# Patient Record
Sex: Male | Born: 1980
Health system: Southern US, Community
[De-identification: ages and names within clinical notes are randomized; demographics above are authoritative.]

## PROBLEM LIST (undated history)

## (undated) DIAGNOSIS — K7460 Unspecified cirrhosis of liver: Secondary | ICD-10-CM

## (undated) DIAGNOSIS — E785 Hyperlipidemia, unspecified: Secondary | ICD-10-CM

## (undated) DIAGNOSIS — Z8719 Personal history of other diseases of the digestive system: Secondary | ICD-10-CM

## (undated) DIAGNOSIS — E119 Type 2 diabetes mellitus without complications: Secondary | ICD-10-CM

## (undated) DIAGNOSIS — K219 Gastro-esophageal reflux disease without esophagitis: Secondary | ICD-10-CM

## (undated) HISTORY — DX: Type 2 diabetes mellitus without complications: E11.9

## (undated) HISTORY — DX: Hyperlipidemia, unspecified: E78.5

## (undated) HISTORY — DX: Unspecified cirrhosis of liver: K74.60

---

## 2013-11-20 ENCOUNTER — Telehealth: Payer: Self-pay

## 2013-11-20 ENCOUNTER — Ambulatory Visit (INDEPENDENT_AMBULATORY_CARE_PROVIDER_SITE_OTHER): Payer: 59 | Admitting: Emergency Medicine

## 2013-11-20 VITALS — BP 120/100 | HR 84 | Temp 98.0°F | Resp 16 | Ht 71.0 in | Wt 292.0 lb

## 2013-11-20 DIAGNOSIS — E119 Type 2 diabetes mellitus without complications: Secondary | ICD-10-CM

## 2013-11-20 DIAGNOSIS — R55 Syncope and collapse: Secondary | ICD-10-CM

## 2013-11-20 LAB — POCT CBC
GRANULOCYTE PERCENT: 59.8 % (ref 37–80)
HEMATOCRIT: 51.8 % (ref 43.5–53.7)
Hemoglobin: 16.2 g/dL (ref 14.1–18.1)
LYMPH, POC: 2.1 (ref 0.6–3.4)
MCH, POC: 28.8 pg (ref 27–31.2)
MCHC: 31.3 g/dL — AB (ref 31.8–35.4)
MCV: 92 fL (ref 80–97)
MID (cbc): 0.2 (ref 0–0.9)
MPV: 8.6 fL (ref 0–99.8)
POC GRANULOCYTE: 3.5 (ref 2–6.9)
POC LYMPH PERCENT: 36.3 %L (ref 10–50)
POC MID %: 3.9 % (ref 0–12)
Platelet Count, POC: 205 10*3/uL (ref 142–424)
RBC: 5.63 M/uL (ref 4.69–6.13)
RDW, POC: 13.7 %
WBC: 5.8 10*3/uL (ref 4.6–10.2)

## 2013-11-20 LAB — POCT URINALYSIS DIPSTICK
BILIRUBIN UA: NEGATIVE
Glucose, UA: 500
KETONES UA: NEGATIVE
Leukocytes, UA: NEGATIVE
Nitrite, UA: NEGATIVE
PH UA: 6
RBC UA: NEGATIVE
Spec Grav, UA: >=1.03
Urobilinogen, UA: 1

## 2013-11-20 LAB — COMPREHENSIVE METABOLIC PANEL
ALT: 36 U/L (ref 0–53)
AST: 28 U/L (ref 0–37)
Albumin: 3.9 g/dL (ref 3.5–5.2)
Alkaline Phosphatase: 87 U/L (ref 39–117)
BUN: 12 mg/dL (ref 6–23)
CO2: 29 meq/L (ref 19–32)
CREATININE: 0.7 mg/dL (ref 0.50–1.35)
Calcium: 8.9 mg/dL (ref 8.4–10.5)
Chloride: 103 mEq/L (ref 96–112)
Glucose, Bld: 264 mg/dL — ABNORMAL HIGH (ref 70–99)
Potassium: 4.6 mEq/L (ref 3.5–5.3)
SODIUM: 138 meq/L (ref 135–145)
TOTAL PROTEIN: 7 g/dL (ref 6.0–8.3)
Total Bilirubin: 0.8 mg/dL (ref 0.2–1.2)

## 2013-11-20 LAB — GLUCOSE, POCT (MANUAL RESULT ENTRY): POC GLUCOSE: 260 mg/dL — AB (ref 70–99)

## 2013-11-20 LAB — POCT GLYCOSYLATED HEMOGLOBIN (HGB A1C): Hemoglobin A1C: 10.8

## 2013-11-20 MED ORDER — ONETOUCH DELICA LANCETS 33G MISC: Status: DC

## 2013-11-20 MED ORDER — ONETOUCH ULTRA SYSTEM W/DEVICE KIT: PACK | Status: DC

## 2013-11-20 MED ORDER — LISINOPRIL 10 MG PO TABS: 10.0000 mg | ORAL_TABLET | Freq: Every day | ORAL | Status: DC

## 2013-11-20 MED ORDER — FREESTYLE SYSTEM KIT: 1.0000 | PACK | Status: DC | PRN

## 2013-11-20 MED ORDER — METFORMIN HCL ER 500 MG PO TB24: ORAL_TABLET | ORAL | Status: DC

## 2013-11-20 MED ORDER — GLUCOSE BLOOD VI STRP: ORAL_STRIP | Status: DC

## 2013-11-20 NOTE — Telephone Encounter (Signed)
[facility]

## 2013-11-20 NOTE — Progress Notes (Signed)
[facility] [address], [place] [id number] [phone]  Date:  11/20/2013   Name:  Jack LewisRaul Porter   DOB:  06/19/1981   MRN:  [id number]  PCP:  No primary provider on file.    Chief Complaint: Extremity Weakness and Dizziness   History of Present Illness:  Jack Porter is a 33 y.o. very pleasant male patient who presents with the following:  Says has frequent dizzy spells.  Had spell yesterday while lifting furniture.  Hyperventilates frequently.  Under an unusual stress at work.  Today when he awakened, had a pain in the right upper chest wall.  Lasted 3-4 minutes.  No provocative or ameliorating factors.  Not pleuritic and sharp in nature.  Felt himself "panic" and got up and walked around trying to stop hyperventilating.  Says he went to the bathroom while voiding became dizzy and fell to the ground.  Never suffered a loss of consciousness.  No further chest pain, nausea or vomiting.  No neuro or visual symptoms.  No seizure.  No fever or chills, antecedent illness.  No rash.  No improvement with over the counter medications or other home remedies. Denies other complaint or health concern today.   There are no active problems to display for this patient.   History reviewed. No pertinent past medical history.  History reviewed. No pertinent past surgical history.  History  Substance Use Topics  . Smoking status: Never Smoker   . Smokeless tobacco: Not on file  . Alcohol Use: No    Family History  Problem Relation Age of Onset  . Diabetes Mother     No Known Allergies  Medication list has been reviewed and updated.  No current outpatient prescriptions on file prior to visit.   No current facility-administered medications on file prior to visit.    Review of Systems:  As per HPI, otherwise negative.    Physical Examination: Filed Vitals:   11/20/13 0936  BP: 120/100  Pulse: 84  Temp: 98 F (36.7 C)  Resp: 16   Filed Vitals:    11/20/13 0936  Height: 5\' 11"  (1.803 m)  Weight: 292 lb (132.45 kg)   Body mass index is 40.74 kg/(m^2). Ideal Body Weight: Weight in (lb) to have BMI = 25: 178.9  GEN: obese, NAD, Non-toxic, A & O x 3 HEENT: Atraumatic, Normocephalic. Neck supple. No masses, No LAD. Ears and Nose: No external deformity. CV: RRR, No M/G/R. No JVD. No thrill. No extra heart sounds. PULM: CTA B, no wheezes, crackles, rhonchi. No retractions. No resp. distress. No accessory muscle use. ABD: S, NT, ND, +BS. No rebound. No HSM. EXTR: No c/c/e NEURO Normal gait.  PSYCH: Normally interactive. Conversant. Not depressed or anxious appearing.  Calm demeanor.    Assessment and Plan: NIDDM Overweight Metformin Glucometer Follow up in one month   Signed,  Phillips OdorJeffery Cecilia Vancleve, MD   Results for orders placed in visit on 11/20/13  POCT CBC      Result Value Ref Range   WBC 5.8  4.6 - 10.2 K/uL   Lymph, poc 2.1  0.6 - 3.4   POC LYMPH PERCENT 36.3  10 - 50 %L   MID (cbc) 0.2  0 - 0.9   POC MID % 3.9  0 - 12 %M   POC Granulocyte 3.5  2 - 6.9   Granulocyte percent 59.8  37 - 80 %G   RBC 5.63  4.69 - 6.13 M/uL   Hemoglobin 16.2  14.1 - 18.1 g/dL   HCT, POC 19.1  47.8 - 53.7 %   MCV 92.0  80 - 97 fL   MCH, POC 28.8  27 - 31.2 pg   MCHC 31.3 (*) 31.8 - 35.4 g/dL   RDW, POC 29.5     Platelet Count, POC 205  142 - 424 K/uL   MPV 8.6  0 - 99.8 fL  GLUCOSE, POCT (MANUAL RESULT ENTRY)      Result Value Ref Range   POC Glucose 260 (*) 70 - 99 mg/dl  POCT URINALYSIS DIPSTICK      Result Value Ref Range   Color, UA yellow     Clarity, UA hazy     Glucose, UA 500     Bilirubin, UA neg     Ketones, UA neg     Spec Grav, UA >=1.030     Blood, UA neg     pH, UA 6.0     Protein, UA trace     Urobilinogen, UA 1.0     Nitrite, UA neg     Leukocytes, UA Negative     Results for orders placed in visit on 11/20/13  POCT CBC      Result Value Ref Range   WBC 5.8  4.6 - 10.2 K/uL   Lymph, poc 2.1  0.6 - 3.4    POC LYMPH PERCENT 36.3  10 - 50 %L   MID (cbc) 0.2  0 - 0.9   POC MID % 3.9  0 - 12 %M   POC Granulocyte 3.5  2 - 6.9   Granulocyte percent 59.8  37 - 80 %G   RBC 5.63  4.69 - 6.13 M/uL   Hemoglobin 16.2  14.1 - 18.1 g/dL   HCT, POC 62.1  30.8 - 53.7 %   MCV 92.0  80 - 97 fL   MCH, POC 28.8  27 - 31.2 pg   MCHC 31.3 (*) 31.8 - 35.4 g/dL   RDW, POC 65.7     Platelet Count, POC 205  142 - 424 K/uL   MPV 8.6  0 - 99.8 fL  GLUCOSE, POCT (MANUAL RESULT ENTRY)      Result Value Ref Range   POC Glucose 260 (*) 70 - 99 mg/dl  POCT URINALYSIS DIPSTICK      Result Value Ref Range   Color, UA yellow     Clarity, UA hazy     Glucose, UA 500     Bilirubin, UA neg     Ketones, UA neg     Spec Grav, UA >=1.030     Blood, UA neg     pH, UA 6.0     Protein, UA trace     Urobilinogen, UA 1.0     Nitrite, UA neg     Leukocytes, UA Negative    POCT GLYCOSYLATED HEMOGLOBIN (HGB A1C)      Result Value Ref Range   Hemoglobin A1C 10.8

## 2013-11-20 NOTE — Telephone Encounter (Signed)
[facility] called to say that pt's ins will not cover freestyle meter. They will only cover one touch ultra. Also for the test strips and lancets, the rx needs to specify how often pt will need to check sugar.  Please call [facility]  [facility] [address] - [place]

## 2013-11-20 NOTE — Patient Instructions (Signed)
Diabetes and Exercise Exercising regularly is important. It is not just about losing weight. It has many health benefits, such as:  Improving your overall fitness, flexibility, and endurance.  Increasing your bone density.  Helping with weight control.  Decreasing your body fat.  Increasing your muscle strength.  Reducing stress and tension.  Improving your overall health. People with diabetes who exercise gain additional benefits because exercise:  Reduces appetite.  Improves the body's use of blood sugar (glucose).  Helps lower or control blood glucose.  Decreases blood pressure.  Helps control blood lipids (such as cholesterol and triglycerides).  Improves the body's use of the hormone insulin by:  Increasing the body's insulin sensitivity.  Reducing the body's insulin needs.  Decreases the risk for heart disease because exercising:  Lowers cholesterol and triglycerides levels.  Increases the levels of good cholesterol (such as high-density lipoproteins [HDL]) in the body.  Lowers blood glucose levels. YOUR ACTIVITY PLAN  Choose an activity that you enjoy and set realistic goals. Your health care provider or diabetes educator can help you make an activity plan that works for you. You can break activities into 2 or 3 sessions throughout the day. Doing so is as good as one long session. Exercise ideas include:  Taking the dog for a walk.  Taking the stairs instead of the elevator.  Dancing to your favorite song.  Doing your favorite exercise with a friend. RECOMMENDATIONS FOR EXERCISING WITH TYPE 1 OR TYPE 2 DIABETES   Check your blood glucose before exercising. If blood glucose levels are greater than 240 mg/dL, check for urine ketones. Do not exercise if ketones are present.  Avoid injecting insulin into areas of the body that are going to be exercised. For example, avoid injecting insulin into:  The arms when playing tennis.  The legs when  jogging.  Keep a record of:  Food intake before and after you exercise.  Expected peak times of insulin action.  Blood glucose levels before and after you exercise.  The type and amount of exercise you have done.  Review your records with your health care provider. Your health care provider will help you to develop guidelines for adjusting food intake and insulin amounts before and after exercising.  If you take insulin or oral hypoglycemic agents, watch for signs and symptoms of hypoglycemia. They include:  Dizziness.  Shaking.  Sweating.  Chills.  Confusion.  Drink plenty of water while you exercise to prevent dehydration or heat stroke. Body water is lost during exercise and must be replaced.  Talk to your health care provider before starting an exercise program to make sure it is safe for you. Remember, almost any type of activity is better than none. Document Released: 12/14/2003 Document Revised: 05/26/2013 Document Reviewed: 03/02/2013 [facility] Patient Information 2014 [place]. Diabetes and Foot Care Diabetes may cause you to have problems because of poor blood supply (circulation) to your feet and legs. This may cause the skin on your feet to become thinner, break easier, and heal more slowly. Your skin may become dry, and the skin may peel and crack. You may also have nerve damage in your legs and feet causing decreased feeling in them. You may not notice minor injuries to your feet that could lead to infections or more serious problems. Taking care of your feet is one of the most important things you can do for yourself.  HOME CARE INSTRUCTIONS  Wear shoes at all times, even in the house. Do not go  barefoot. Bare feet are easily injured.  Check your feet daily for blisters, cuts, and redness. If you cannot see the bottom of your feet, use a mirror or ask someone for help.  Wash your feet with warm water (do not use hot water) and mild soap. Then pat your feet  and the areas between your toes until they are completely dry. Do not soak your feet as this can dry your skin.  Apply a moisturizing lotion or petroleum jelly (that does not contain alcohol and is unscented) to the skin on your feet and to dry, brittle toenails. Do not apply lotion between your toes.  Trim your toenails straight across. Do not dig under them or around the cuticle. File the edges of your nails with an emery board or nail file.  Do not cut corns or calluses or try to remove them with medicine.  Wear clean socks or stockings every day. Make sure they are not too tight. Do not wear knee-high stockings since they may decrease blood flow to your legs.  Wear shoes that fit properly and have enough cushioning. To break in new shoes, wear them for just a few hours a day. This prevents you from injuring your feet. Always look in your shoes before you put them on to be sure there are no objects inside.  Do not cross your legs. This may decrease the blood flow to your feet.  If you find a minor scrape, cut, or break in the skin on your feet, keep it and the skin around it clean and dry. These areas may be cleansed with mild soap and water. Do not cleanse the area with peroxide, alcohol, or iodine.  When you remove an adhesive bandage, be sure not to damage the skin around it.  If you have a wound, look at it several times a day to make sure it is healing.  Do not use heating pads or hot water bottles. They may burn your skin. If you have lost feeling in your feet or legs, you may not know it is happening until it is too late.  Make sure your health care provider performs a complete foot exam at least annually or more often if you have foot problems. Report any cuts, sores, or bruises to your health care provider immediately. SEEK MEDICAL CARE IF:   You have an injury that is not healing.  You have cuts or breaks in the skin.  You have an ingrown nail.  You notice redness on your  legs or feet.  You feel burning or tingling in your legs or feet.  You have pain or cramps in your legs and feet.  Your legs or feet are numb.  Your feet always feel cold. SEEK IMMEDIATE MEDICAL CARE IF:   There is increasing redness, swelling, or pain in or around a wound.  There is a red line that goes up your leg.  Pus is coming from a wound.  You develop a fever or as directed by your health care provider.  You notice a bad smell coming from an ulcer or wound. Document Released: 09/20/2000 Document Revised: 05/26/2013 Document Reviewed: 03/02/2013 [facility] Patient Information 2014 [place], [place]. Diabetes Meal Planning Guide The diabetes meal planning guide is a tool to help you plan your meals and snacks. It is important for people with diabetes to manage their blood glucose (sugar) levels. Choosing the right foods and the right amounts throughout your day will help control your blood glucose. Eating  right can even help you improve your blood pressure and reach or maintain a healthy weight. CARBOHYDRATE COUNTING MADE EASY When you eat carbohydrates, they turn to sugar. This raises your blood glucose level. Counting carbohydrates can help you control this level so you feel better. When you plan your meals by counting carbohydrates, you can have more flexibility in what you eat and balance your medicine with your food intake. Carbohydrate counting simply means adding up the total amount of carbohydrate grams in your meals and snacks. Try to eat about the same amount at each meal. Foods with carbohydrates are listed below. Each portion below is 1 carbohydrate serving or 15 grams of carbohydrates. Ask your dietician how many grams of carbohydrates you should eat at each meal or snack. Grains and Starches  1 slice bread.   English muffin or hotdog/hamburger bun.   cup cold cereal (unsweetened).   cup cooked pasta or rice.   cup starchy vegetables (corn, potatoes, peas, beans,  winter squash).  1 tortilla (6 inches).   bagel.  1 waffle or pancake (size of a CD).   cup cooked cereal.  4 to 6 small crackers. *Whole grain is recommended. Fruit  1 cup fresh unsweetened berries, melon, papaya, pineapple.  1 small fresh fruit.   banana or mango.   cup fruit juice (4 oz unsweetened).   cup canned fruit in natural juice or water.  2 tbs dried fruit.  12 to 15 grapes or cherries. Milk and Yogurt  1 cup fat-free or 1% milk.  1 cup soy milk.  6 oz light yogurt with sugar-free sweetener.  6 oz low-fat soy yogurt.  6 oz plain yogurt. Vegetables  1 cup raw or  cup cooked is counted as 0 carbohydrates or a "free" food.  If you eat 3 or more servings at 1 meal, count them as 1 carbohydrate serving. Other Carbohydrates   oz chips or pretzels.   cup ice cream or frozen yogurt.   cup sherbet or sorbet.  2 inch square cake, no frosting.  1 tbs honey, sugar, jam, jelly, or syrup.  2 small cookies.  3 squares of graham crackers.  3 cups popcorn.  6 crackers.  1 cup broth-based soup.  Count 1 cup casserole or other mixed foods as 2 carbohydrate servings.  Foods with less than 20 calories in a serving may be counted as 0 carbohydrates or a "free" food. You may want to purchase a book or computer software that lists the carbohydrate gram counts of different foods. In addition, the nutrition facts panel on the labels of the foods you eat are a good source of this information. The label will tell you how big the serving size is and the total number of carbohydrate grams you will be eating per serving. Divide this number by 15 to obtain the number of carbohydrate servings in a portion. Remember, 1 carbohydrate serving equals 15 grams of carbohydrate. SERVING SIZES Measuring foods and serving sizes helps you make sure you are getting the right amount of food. The list below tells how big or small some common serving sizes are.  1  oz.........4 stacked dice.  3 oz........[place] KitchenDeck of cards.  1 tsp.......[place] KitchenTip of little finger.  1 tbs......Marland KitchenMarland KitchenThumb.  2 tbs.......Marland KitchenGolf ball.   cup......Marland KitchenHalf of a fist.  1 cup.......Marland KitchenA fist. SAMPLE DIABETES MEAL PLAN Below is a sample meal plan that includes foods from the grain and starches, dairy, vegetable, fruit, and meat groups. A dietician can individualize a meal plan  to fit your calorie needs and tell you the number of servings needed from each food group. However, controlling the total amount of carbohydrates in your meal or snack is more important than making sure you include all of the food groups at every meal. You may interchange carbohydrate containing foods (dairy, starches, and fruits). The meal plan below is an example of a 2000 calorie diet using carbohydrate counting. This meal plan has 17 carbohydrate servings. Breakfast  1 cup oatmeal (2 carb servings).   cup light yogurt (1 carb serving).  1 cup blueberries (1 carb serving).   cup almonds. Snack  1 large apple (2 carb servings).  1 low-fat string cheese stick. Lunch  Chicken breast salad.  1 cup spinach.   cup chopped tomatoes.  2 oz chicken breast, sliced.  2 tbs low-fat [place] dressing.  12 whole-wheat crackers (2 carb servings).  12 to 15 grapes (1 carb serving).  1 cup low-fat milk (1 carb serving). Snack  1 cup carrots.   cup hummus (1 carb serving). Dinner  3 oz broiled salmon.  1 cup brown rice (3 carb servings). Snack  1  cups steamed broccoli (1 carb serving) drizzled with 1 tsp olive oil and lemon juice.  1 cup light pudding (2 carb servings). DIABETES MEAL PLANNING WORKSHEET Your dietician can use this worksheet to help you decide how many servings of foods and what types of foods are right for you.  BREAKFAST Food Group and Servings / Carb Servings Grain/Starches __________________________________ Dairy __________________________________________ Vegetable  ______________________________________ Fruit ___________________________________________ Meat __________________________________________ Fat ____________________________________________ LUNCH Food Group and Servings / Carb Servings Grain/Starches ___________________________________ Dairy ___________________________________________ Fruit ____________________________________________ Meat ___________________________________________ Fat _____________________________________________ [place] Food Group and Servings / Carb Servings Grain/Starches ___________________________________ Dairy ___________________________________________ Fruit ____________________________________________ Meat ___________________________________________ Fat _____________________________________________ SNACKS Food Group and Servings / Carb Servings Grain/Starches ___________________________________ Dairy ___________________________________________ Vegetable _______________________________________ Fruit ____________________________________________ Meat ___________________________________________ Fat _____________________________________________ DAILY TOTALS Starches _________________________ Vegetable ________________________ Fruit ____________________________ Dairy ____________________________ Meat ____________________________ Fat ______________________________ Document Released: 06/20/2005 Document Revised: 12/16/2011 Document Reviewed: 05/01/2009 [employer] Patient Information 2014 [employer]. Type 2 Diabetes Mellitus, Adult Type 2 diabetes mellitus, often simply referred to as type 2 diabetes, is a long-lasting (chronic) disease. In type 2 diabetes, the pancreas does not make enough insulin (a hormone), the cells are less responsive to the insulin that is made (insulin resistance), or both. Normally, insulin moves sugars from food into the tissue cells. The tissue cells use the sugars for energy. The lack  of insulin or the lack of normal response to insulin causes excess sugars to build up in the blood instead of going into the tissue cells. As a result, high blood sugar (hyperglycemia) develops. The effect of high sugar (glucose) levels can cause many complications. Type 2 diabetes was also previously called adult-onset diabetes but it can occur at any age.  RISK FACTORS  A person is predisposed to developing type 2 diabetes if someone in the family has the disease and also has one or more of the following primary risk factors:  Overweight.  An inactive lifestyle.  A history of consistently eating high-calorie foods. Maintaining a normal weight and regular physical activity can reduce the chance of developing type 2 diabetes. SYMPTOMS  A person with type 2 diabetes may not show symptoms initially. The symptoms of type 2 diabetes appear slowly. The symptoms include:  Increased thirst (polydipsia).  Increased urination (polyuria).  Increased urination during the night (nocturia).  Weight loss. This weight loss may be rapid.  Frequent, recurring infections.  Tiredness (fatigue).  Weakness.  Vision changes, such as blurred vision.  Fruity smell to your breath.  Abdominal pain.  Nausea or vomiting.  Cuts or bruises which are slow to heal.  Tingling or numbness in the hands or feet. DIAGNOSIS Type 2 diabetes is frequently not diagnosed until complications of diabetes are present. Type 2 diabetes is diagnosed when symptoms or complications are present and when blood glucose levels are increased. Your blood glucose level may be checked by one or more of the following blood tests:  A fasting blood glucose test. You will not be allowed to eat for at least 8 hours before a blood sample is taken.  A random blood glucose test. Your blood glucose is checked at any time of the day regardless of when you ate.  A hemoglobin A1c blood glucose test. A hemoglobin A1c test provides  information about blood glucose control over the previous 3 months.  An oral glucose tolerance test (OGTT). Your blood glucose is measured after you have not eaten (fasted) for 2 hours and then after you drink a glucose-containing beverage. TREATMENT   You may need to take insulin or diabetes medicine daily to keep blood glucose levels in the desired range.  You will need to match insulin dosing with exercise and healthy food choices. The treatment goal is to maintain the before meal blood sugar (preprandial glucose) level at 70 130 mg/dL. HOME CARE INSTRUCTIONS   Have your hemoglobin A1c level checked twice a year.  Perform daily blood glucose monitoring as directed by your caregiver.  Monitor urine ketones when you are ill and as directed by your caregiver.  Take your diabetes medicine or insulin as directed by your caregiver to maintain your blood glucose levels in the desired range.  Never run out of diabetes medicine or insulin. It is needed every day.  Adjust insulin based on your intake of carbohydrates. Carbohydrates can raise blood glucose levels but need to be included in your diet. Carbohydrates provide vitamins, minerals, and fiber which are an essential part of a healthy diet. Carbohydrates are found in fruits, vegetables, whole grains, dairy products, legumes, and foods containing added sugars.    Eat healthy foods. Alternate 3 meals with 3 snacks.  Lose weight if overweight.  Carry a medical alert card or wear your medical alert jewelry.  Carry a 15 gram carbohydrate snack with you at all times to treat low blood glucose (hypoglycemia). Some examples of 15 gram carbohydrate snacks include:  Glucose tablets, 3 or 4   Glucose gel, 15 gram tube  Raisins, 2 tablespoons (24 grams)  Jelly beans, 6  Animal crackers, 8  Regular pop, 4 ounces (120 mL)  Gummy treats, 9  Recognize hypoglycemia. Hypoglycemia occurs with blood glucose levels of 70 mg/dL and below.  The risk for hypoglycemia increases when fasting or skipping meals, during or after intense exercise, and during sleep. Hypoglycemia symptoms can include:  Tremors or shakes.  Decreased ability to concentrate.  Sweating.  Increased heart rate.  Headache.  Dry mouth.  Hunger.  Irritability.  Anxiety.  Restless sleep.  Altered speech or coordination.  Confusion.  Treat hypoglycemia promptly. If you are alert and able to safely swallow, follow the 15:15 rule:  Take 15 20 grams of rapid-acting glucose or carbohydrate. Rapid-acting options include glucose gel, glucose tablets, or 4 ounces (120 mL) of fruit juice, regular soda, or low fat milk.  Check your blood glucose level 15 minutes after taking the  glucose.  Take 15 20 grams more of glucose if the repeat blood glucose level is still 70 mg/dL or below.  Eat a meal or snack within 1 hour once blood glucose levels return to normal.    Be alert to polyuria and polydipsia which are early signs of hyperglycemia. An early awareness of hyperglycemia allows for prompt treatment. Treat hyperglycemia as directed by your caregiver.  Engage in at least 150 minutes of moderate-intensity physical activity a week, spread over at least 3 days of the week or as directed by your caregiver. In addition, you should engage in resistance exercise at least 2 times a week or as directed by your caregiver.  Adjust your medicine and food intake as needed if you start a new exercise or sport.  Follow your sick day plan at any time you are unable to eat or drink as usual.  Avoid tobacco use.  Limit alcohol intake to no more than 1 drink per day for nonpregnant women and 2 drinks per day for men. You should drink alcohol only when you are also eating food. Talk with your caregiver whether alcohol is safe for you. Tell your caregiver if you drink alcohol several times a week.  Follow up with your caregiver regularly.  Schedule an eye exam soon  after the diagnosis of type 2 diabetes and then annually.  Perform daily skin and foot care. Examine your skin and feet daily for cuts, bruises, redness, nail problems, bleeding, blisters, or sores. A foot exam by a caregiver should be done annually.  Brush your teeth and gums at least twice a day and floss at least once a day. Follow up with your dentist regularly.  Share your diabetes management plan with your workplace or school.  Stay up-to-date with immunizations.  Learn to manage stress.  Obtain ongoing diabetes education and support as needed.  Participate in, or seek rehabilitation as needed to maintain or improve independence and quality of life. Request a physical or occupational therapy referral if you are having foot or hand numbness or difficulties with grooming, dressing, eating, or physical activity. SEEK MEDICAL CARE IF:   You are unable to eat food or drink fluids for more than 6 hours.  You have nausea and vomiting for more than 6 hours.  Your blood glucose level is over 240 mg/dL.  There is a change in mental status.  You develop an additional serious illness.  You have diarrhea for more than 6 hours.  You have been sick or have had a fever for a couple of days and are not getting better.  You have pain during any physical activity.  SEEK IMMEDIATE MEDICAL CARE IF:  You have difficulty breathing.  You have moderate to large ketone levels. MAKE SURE YOU:  Understand these instructions.  Will watch your condition.  Will get help right away if you are not doing well or get worse. Document Released: 09/23/2005 Document Revised: 06/17/2012 Document Reviewed: 04/21/2012 [facility] Patient Information 2014 [place], [place].

## 2014-01-19 ENCOUNTER — Ambulatory Visit (INDEPENDENT_AMBULATORY_CARE_PROVIDER_SITE_OTHER): Payer: 59 | Admitting: Emergency Medicine

## 2014-01-19 VITALS — BP 111/78 | HR 118 | Temp 98.2°F | Resp 16 | Ht 71.0 in | Wt 277.0 lb

## 2014-01-19 DIAGNOSIS — R109 Unspecified abdominal pain: Secondary | ICD-10-CM

## 2014-01-19 DIAGNOSIS — E119 Type 2 diabetes mellitus without complications: Secondary | ICD-10-CM

## 2014-01-19 DIAGNOSIS — R112 Nausea with vomiting, unspecified: Secondary | ICD-10-CM

## 2014-01-19 LAB — COMPLETE METABOLIC PANEL WITH GFR
ALT: 29 U/L (ref 0–53)
AST: 20 U/L (ref 0–37)
Albumin: 4.4 g/dL (ref 3.5–5.2)
Alkaline Phosphatase: 71 U/L (ref 39–117)
BILIRUBIN TOTAL: 1 mg/dL (ref 0.2–1.2)
BUN: 11 mg/dL (ref 6–23)
CO2: 25 mEq/L (ref 19–32)
CREATININE: 0.61 mg/dL (ref 0.50–1.35)
Calcium: 9.4 mg/dL (ref 8.4–10.5)
Chloride: 102 mEq/L (ref 96–112)
GLUCOSE: 138 mg/dL — AB (ref 70–99)
Potassium: 4.6 mEq/L (ref 3.5–5.3)
Sodium: 138 mEq/L (ref 135–145)
Total Protein: 7.7 g/dL (ref 6.0–8.3)

## 2014-01-19 LAB — POCT CBC
Granulocyte percent: 79.8 %G (ref 37–80)
HEMATOCRIT: 49.3 % (ref 43.5–53.7)
Hemoglobin: 16.2 g/dL (ref 14.1–18.1)
LYMPH, POC: 1.5 (ref 0.6–3.4)
MCH, POC: 29.3 pg (ref 27–31.2)
MCHC: 32.9 g/dL (ref 31.8–35.4)
MCV: 89.1 fL (ref 80–97)
MID (cbc): 0.3 (ref 0–0.9)
MPV: 9.2 fL (ref 0–99.8)
POC Granulocyte: 7 — AB (ref 2–6.9)
POC LYMPH %: 16.6 % (ref 10–50)
POC MID %: 3.6 %M (ref 0–12)
Platelet Count, POC: 280 10*3/uL (ref 142–424)
RBC: 5.53 M/uL (ref 4.69–6.13)
RDW, POC: 13.6 %
WBC: 8.8 10*3/uL (ref 4.6–10.2)

## 2014-01-19 LAB — LIPASE: LIPASE: 32 U/L (ref 0–75)

## 2014-01-19 LAB — GLUCOSE, POCT (MANUAL RESULT ENTRY): POC Glucose: 137 mg/dl — AB (ref 70–99)

## 2014-01-19 LAB — AMYLASE: Amylase: 28 U/L (ref 0–105)

## 2014-01-19 MED ORDER — GLIPIZIDE ER 5 MG PO TB24: 5.0000 mg | ORAL_TABLET | Freq: Every day | ORAL | Status: DC

## 2014-01-19 MED ORDER — OMEPRAZOLE 40 MG PO CPDR: 40.0000 mg | DELAYED_RELEASE_CAPSULE | Freq: Every day | ORAL | Status: DC

## 2014-01-19 NOTE — Patient Instructions (Addendum)
Please stop your metformin. Please take Glucotrol XL 5 one a day. Please recheck in one month. Please stop your lisinopril for now.

## 2014-01-19 NOTE — Progress Notes (Signed)
[facility] called in stat lab results- Glucose was 138.

## 2014-01-19 NOTE — Progress Notes (Addendum)
Subjective:    Patient ID: Jack Porter, male    DOB: 06/22/1981, 33 y.o.   MRN: [id number] This chart was scribed for Lesle ChrisSteven Huntley Knoop, MD by Marica OtterNusrat Rahman, ED Scribe. This patient was seen in room 12 and the patient's care was started at 9:35 AM.    HPI HPI Comments: Jack Porter is a 33 y.o. male who presents to the [facility] complaining of adverse impacts from metformin. Pt was diagnosed with DM approximately two months ago, on 11/20/13, and he was started on metformin 500 MG 24 hr tablet. Pt reports that since starting metformin, he has been experiencing intermittent abd pain, sleep disturbance (only 1 episode), nausea, vomiting, constipation in the morning, and diarrhea onset about 1 month ago. Pt reports that when he was taking 4 doses of metformin daily his sugar levels were in the range of 97-103 mg/100 mL of blood. Pt reports in the past couple of weeks he has reduced his metformin dosage to two doses/day and his glucose levels have been in the low 120s mg/100 mL of blood. Pt denies: acid reflux; taking ASA or other over the counter meds; being a smoker; and alcohol use. Pt reports he has been trying to be more conscientious of his diet. However, pt denies increased physical activity since his DM diagnosis.   Pt has lost 15 lbs since [facility].   Pt reports a family Hx of DM, specifically his mother, uncles and sister.   Review of Systems  Gastrointestinal: Positive for nausea, vomiting, abdominal pain, diarrhea and constipation.  Psychiatric/Behavioral: Positive for sleep disturbance.  All other systems reviewed and are negative.  Objective:   Physical Exam HEENT exam. TMs are clear nose is normal throat is clear. Neck is supple. Chest is clear to both auscultation and percussion heart regular rate no murmurs. Abdomen is flat liver and spleen not enlarged no masses are felt  Results for orders placed in visit on 01/19/14  GLUCOSE, POCT  (MANUAL RESULT ENTRY)      Result Value Ref Range   POC Glucose 137 (*) 70 - 99 mg/dl    Results for orders placed in visit on 01/19/14  GLUCOSE, POCT (MANUAL RESULT ENTRY)      Result Value Ref Range   POC Glucose 137 (*) 70 - 99 mg/dl  POCT CBC      Result Value Ref Range   WBC 8.8  4.6 - 10.2 K/uL   Lymph, poc 1.5  0.6 - 3.4   POC LYMPH PERCENT 16.6  10 - 50 %L   MID (cbc) 0.3  0 - 0.9   POC MID % 3.6  0 - 12 %M   POC Granulocyte 7.0 (*) 2 - 6.9   Granulocyte percent 79.8  37 - 80 %G   RBC 5.53  4.69 - 6.13 M/uL   Hemoglobin 16.2  14.1 - 18.1 g/dL   HCT, POC 86.549.3  78.443.5 - 53.7 %   MCV 89.1  80 - 97 fL   MCH, POC 29.3  27 - 31.2 pg   MCHC 32.9  31.8 - 35.4 g/dL   RDW, POC 69.613.6     Platelet Count, POC 280  142 - 424 K/uL   MPV 9.2  0 - 99.8 fL   Results for orders placed in visit on 01/19/14  LIPASE      Result Value Ref Range   Lipase 32  0 - 75 U/L  HELICOBACTER  PYLORI  ANTIBODY, IGM      Result Value Ref Range   Helicobacter pylori, IgM    <9.0 U/mL  AMYLASE      Result Value Ref Range   Amylase 28  0 - 105 U/L  COMPLETE METABOLIC PANEL WITH GFR      Result Value Ref Range   Sodium 138  135 - 145 mEq/L   Potassium 4.6  3.5 - 5.3 mEq/L   Chloride 102  96 - 112 mEq/L   CO2 25  19 - 32 mEq/L   Glucose, Bld 138 (*) 70 - 99 mg/dL   BUN 11  6 - 23 mg/dL   Creat 9.560.61  2.130.50 - 0.861.35 mg/dL   Total Bilirubin 1.0  0.2 - 1.2 mg/dL   Alkaline Phosphatase 71  39 - 117 U/L   AST 20  0 - 37 U/L   ALT 29  0 - 53 U/L   Total Protein 7.7  6.0 - 8.3 g/dL   Albumin 4.4  3.5 - 5.2 g/dL   Calcium 9.4  8.4 - 57.810.5 mg/dL   GFR, Est African American >89     GFR, Est Non African American >89    GLUCOSE, POCT (MANUAL RESULT ENTRY)      Result Value Ref Range   POC Glucose 137 (*) 70 - 99 mg/dl  POCT CBC      Result Value Ref Range   WBC 8.8  4.6 - 10.2 K/uL   Lymph, poc 1.5  0.6 - 3.4   POC LYMPH PERCENT 16.6  10 - 50 %L   MID (cbc) 0.3  0 - 0.9   POC MID % 3.6  0 - 12 %M   POC  Granulocyte 7.0 (*) 2 - 6.9   Granulocyte percent 79.8  37 - 80 %G   RBC 5.53  4.69 - 6.13 M/uL   Hemoglobin 16.2  14.1 - 18.1 g/dL   HCT, POC 46.949.3  62.943.5 - 53.7 %   MCV 89.1  80 - 97 fL   MCH, POC 29.3  27 - 31.2 pg   MCHC 32.9  31.8 - 35.4 g/dL   RDW, POC 52.813.6     Platelet Count, POC 280  142 - 424 K/uL   MPV 9.2  0 - 99.8 fL   DIAGNOSTIC STUDIES: Oxygen Saturation is 98% on RA, normal by my interpretation.    COORDINATION OF CARE:  9:41 AM-Discussed treatment plan which includes advising pt to make lifestyle changes to improve his DM symptoms and labs with pt at bedside and pt agreed to plan.         Assessment & Plan:  Patient is somewhat orthostatic. Marland Kitchen. He has lost 15 pounds since his last visit. We'll treat with IV fluids for now I changed his diabetes medicine to Glucotrol XL 5 one a day.Bernette Redbird.Stat Cmet  was done. Mainly to check his renal function. Place lisinopril on hold. We'll hold metformin for now. We'll change to Glucotrol XL 5 one a day recheck . Hopefully he will be doing well and we can reinstitute a low dose metformin treatment the..Marland Kitchen

## 2014-01-20 ENCOUNTER — Other Ambulatory Visit: Payer: Self-pay | Admitting: Emergency Medicine

## 2014-01-20 ENCOUNTER — Ambulatory Visit
Admission: RE | Admit: 2014-01-20 | Discharge: 2014-01-20 | Disposition: A | Discharge status: Home / Self Care | Payer: 59 | Source: Ambulatory Visit | Attending: Emergency Medicine | Admitting: Emergency Medicine

## 2014-01-20 ENCOUNTER — Ambulatory Visit (INDEPENDENT_AMBULATORY_CARE_PROVIDER_SITE_OTHER): Payer: 59 | Admitting: Emergency Medicine

## 2014-01-20 ENCOUNTER — Telehealth: Payer: Self-pay

## 2014-01-20 VITALS — BP 130/80 | HR 107 | Temp 98.2°F | Resp 17 | Ht 72.5 in | Wt 278.0 lb

## 2014-01-20 DIAGNOSIS — R109 Unspecified abdominal pain: Secondary | ICD-10-CM

## 2014-01-20 DIAGNOSIS — K8020 Calculus of gallbladder without cholecystitis without obstruction: Secondary | ICD-10-CM

## 2014-01-20 DIAGNOSIS — E119 Type 2 diabetes mellitus without complications: Secondary | ICD-10-CM

## 2014-01-20 DIAGNOSIS — E781 Pure hyperglyceridemia: Secondary | ICD-10-CM

## 2014-01-20 DIAGNOSIS — R935 Abnormal findings on diagnostic imaging of other abdominal regions, including retroperitoneum: Secondary | ICD-10-CM

## 2014-01-20 DIAGNOSIS — R110 Nausea: Secondary | ICD-10-CM

## 2014-01-20 DIAGNOSIS — E1169 Type 2 diabetes mellitus with other specified complication: Secondary | ICD-10-CM | POA: Insufficient documentation

## 2014-01-20 LAB — GLUCOSE, POCT (MANUAL RESULT ENTRY): POC GLUCOSE: 128 mg/dL — AB (ref 70–99)

## 2014-01-20 LAB — HELICOBACTER PYLORI  ANTIBODY, IGM: Helicobacter pylori, IgM: 6 U/mL (ref <9.0)

## 2014-01-20 LAB — POCT CBC
GRANULOCYTE PERCENT: 72.8 % (ref 37–80)
HEMATOCRIT: 48.3 % (ref 43.5–53.7)
Hemoglobin: 15.7 g/dL (ref 14.1–18.1)
Lymph, poc: 1.7 (ref 0.6–3.4)
MCH, POC: 29.1 pg (ref 27–31.2)
MCHC: 32.5 g/dL (ref 31.8–35.4)
MCV: 89.7 fL (ref 80–97)
MID (cbc): 0.3 (ref 0–0.9)
MPV: 9 fL (ref 0–99.8)
POC GRANULOCYTE: 5.5 (ref 2–6.9)
POC LYMPH PERCENT: 22.8 %L (ref 10–50)
POC MID %: 4.4 %M (ref 0–12)
Platelet Count, POC: 265 10*3/uL (ref 142–424)
RBC: 5.39 M/uL (ref 4.69–6.13)
RDW, POC: 14 %
WBC: 7.6 10*3/uL (ref 4.6–10.2)

## 2014-01-20 LAB — IFOBT (OCCULT BLOOD): IMMUNOLOGICAL FECAL OCCULT BLOOD TEST: NEGATIVE

## 2014-01-20 MED ORDER — TRAMADOL HCL 50 MG PO TABS: 50.0000 mg | ORAL_TABLET | Freq: Four times a day (QID) | ORAL | Status: DC | PRN

## 2014-01-20 MED ORDER — IOHEXOL 300 MG/ML  SOLN
125.0000 mL | Freq: Once | INTRAMUSCULAR | Status: AC | PRN
  Administered 2014-01-20: 125 mL via INTRAVENOUS

## 2014-01-20 MED ORDER — ONDANSETRON 8 MG PO TBDP: 8.0000 mg | ORAL_TABLET | Freq: Three times a day (TID) | ORAL | Status: DC | PRN

## 2014-01-20 NOTE — Telephone Encounter (Signed)
Patient called. He is having 7/10 pain. He did eat a full supper at 4:00. I told him to stay n.p.o. after midnight. I will call and talk with him in the morning and see if he wants to proceed with evaluation for cholecystectomy.

## 2014-01-20 NOTE — Telephone Encounter (Signed)
Tramadol called in to [facility]. Referrals, a consult with [facility] was ordered urgent but can we please make sure this gets done tomorrow for Dr. Cleta Albertsaub. Thanks

## 2014-01-20 NOTE — Telephone Encounter (Signed)
Patient calling upset twice and now his "emergency contact" (she didn't give a name) is complaining that Dr. Cleta Albertsaub was supposed to call him in something for pain after he got the results for the CT scan. Please advise. He says he is in a a lot of pain. Emergency contact threatened that this is not an "option to wait" for Dr. Cleta Albertsaub to call something in tomorrow. Since he did not do it today.   Patient wants to have another Dr. Otelia SanteeLook at results and call something in for pain since Dr. Cleta Albertsaub didn't today. I told patient if he is worse and can barely move clinical might advise him to come in to be seen. Patient and his "emergency contact aware"  They say its urgent. Call:  [phone]

## 2014-01-20 NOTE — Patient Instructions (Signed)
Your CT scan of the abdomen and pelvis will be done at [address] Below are the directions.    [address]  [place], [place] [address]     1. Head south on [address] toward [address]      0.5 mi    2. Sharp left onto [address]      0.6 mi    3. Turn left onto [address] ramp      0.2 mi    4. Merge onto [address]      3.0 mi    5. Continue straight to stay on [address]      0.4 mi    6. Slight left to stay on [address]  Destination will be on the right     1.0 mi     [address]  [place], [place] [address]

## 2014-01-20 NOTE — Telephone Encounter (Signed)
I called and spoke with the patient . I spoke with the [facility] for [facility] the plan will be for me to check with Mr. Jayme CloudGonzalez in the morning. He will stay n.p.o. after midnight tonight. If he has persistent pain and nausea then I will discuss the case with Dr. Gaynelle AduEric Wilson who is the [facility] in the morning. If he is doing better I will have him seen in urgent [facility]

## 2014-01-20 NOTE — Progress Notes (Addendum)
Subjective:    Patient ID: Jack Porter, male    DOB: 09/11/1981, 33 y.o.   MRN: [id number]  HPI This chart was scribed for Viviann SpareSteven Stclair Szymborski-MD, by Ladona Ridgelaylor Day, Scribe. This patient was seen in room 4 and the patient's care was started at 1:05 PM.  HPI Comments: Jack Porter is a 33 y.o. male who presents to the [facility] for persistent abdominal pain.  Today, he c/o LLQ abdominal pain although when he was seen yesterday, was c/o epigastric abdominal pain. He has been having waxing and waning abdominal pain for a month now which has recently worsened over the past few days. He reports last night had nausea, subjective fever and was sweating. When he was seen yesterday, his w/u focused on improving his recent dx of DM x2 months ago. He denies any BM sx besides straining hard to have a BM. He has no fam hx of diverticulitis.   No past medical history on file.  No Known Allergies  No orders of the defined types were placed in this encounter.    Review of Systems  Constitutional: Negative for fever and chills.  Respiratory: Negative for cough and shortness of breath.   Cardiovascular: Negative for chest pain.  Gastrointestinal: Positive for nausea and abdominal pain. Negative for vomiting.  Musculoskeletal: Negative for back pain.      Objective:   Physical Exam Nursing note and vitals reviewed. Constitutional: Patient is oriented to person, place, and time. Patient appears well-developed and well-nourished. No distress.  HENT:  Head: Normocephalic and atraumatic.  Neck: Neck supple. No tracheal deviation present.  Cardiovascular: Normal rate, regular rhythm and normal heart sounds.   No murmur heard. Pulmonary/Chest: Effort normal and breath sounds normal. No respiratory distress. Patient has no wheezes. Patient has no rales.  Musculoskeletal: Normal range of motion.  Neurological: Patient is alert and oriented to person, place, and time.  Skin: Skin is warm and  dry.  Psychiatric: Patient has a normal mood and affect. Patient's behavior is normal.  Abdomen is flat. There is no definite rebound. There is some guarding right mid and upper abdomen as well as guarding deep in the left lower quadrant..bowel sounds present. Triage Vitals: BP 130/80  Pulse 107  Temp(Src) 98.2 F (36.8 C) (Oral)  Resp 17  Ht 6' 0.5" (1.842 m)  Wt 278 lb (126.1 kg)  BMI 37.17 kg/m2  SpO2 99% Results for orders placed in visit on 01/20/14  POCT CBC      Result Value Ref Range   WBC 7.6  4.6 - 10.2 K/uL   Lymph, poc 1.7  0.6 - 3.4   POC LYMPH PERCENT 22.8  10 - 50 %L   MID (cbc) 0.3  0 - 0.9   POC MID % 4.4  0 - 12 %M   POC Granulocyte 5.5  2 - 6.9   Granulocyte percent 72.8  37 - 80 %G   RBC 5.39  4.69 - 6.13 M/uL   Hemoglobin 15.7  14.1 - 18.1 g/dL   HCT, POC 40.948.3  81.143.5 - 53.7 %   MCV 89.7  80 - 97 fL   MCH, POC 29.1  27 - 31.2 pg   MCHC 32.5  31.8 - 35.4 g/dL   RDW, POC 91.414.0     Platelet Count, POC 265  142 - 424 K/uL   MPV 9.0  0 - 99.8 fL  GLUCOSE, POCT (MANUAL RESULT ENTRY)      Result Value Ref  Range   POC Glucose 128 (*) 70 - 99 mg/dl  IFOBT (OCCULT BLOOD)      Result Value Ref Range   IFOBT Negative        Assessment & Plan:  DIAGNOSTIC STUDIES: Oxygen Saturation is 99% on room air, normal by my interpretation.    COORDINATION OF CARE: At 100 PM Discussed treatment plan with patient which includes abdominal/plevis CT, blood work. Patient agrees.  The patient has significant abdominal pain. He does not have fever but does have tachycardia. His white count remains normal and he had no temperature in our office. We'll proceed with CT of the abdomen to  I personally performed the services described in this documentation, which was scribed in my presence. The recorded information has been reviewed and is accurate.    CT scan did show gallstones and some enlarged lymph nodes with some mesentery abnormalities. Dr. Andrey CampanileWilson was called the morning of  01/21/2014. [facility] will [facility] and evaluate for cholecystectomy. I checked with the patient the morning of 01/21/2014 and he was improved with treatment with ultram. And Zofran.

## 2014-01-21 ENCOUNTER — Telehealth: Payer: Self-pay | Admitting: *Deleted

## 2014-01-21 ENCOUNTER — Ambulatory Visit (HOSPITAL_COMMUNITY): Payer: 59

## 2014-01-21 ENCOUNTER — Encounter (HOSPITAL_COMMUNITY): Admission: RE | Disposition: A | Discharge status: Home / Self Care | Payer: Self-pay | Source: Ambulatory Visit

## 2014-01-21 ENCOUNTER — Other Ambulatory Visit (INDEPENDENT_AMBULATORY_CARE_PROVIDER_SITE_OTHER): Payer: Self-pay | Admitting: General Surgery

## 2014-01-21 ENCOUNTER — Ambulatory Visit (HOSPITAL_COMMUNITY): Payer: 59 | Admitting: Anesthesiology

## 2014-01-21 ENCOUNTER — Encounter (HOSPITAL_COMMUNITY): Payer: 59 | Admitting: Anesthesiology

## 2014-01-21 ENCOUNTER — Encounter (HOSPITAL_COMMUNITY): Payer: Self-pay | Admitting: *Deleted

## 2014-01-21 ENCOUNTER — Observation Stay (HOSPITAL_COMMUNITY)
Admission: RE | Admit: 2014-01-21 | Discharge: 2014-01-22 | Disposition: A | Discharge status: Home / Self Care | Payer: 59 | Source: Ambulatory Visit | Attending: Surgery | Admitting: Surgery

## 2014-01-21 DIAGNOSIS — E119 Type 2 diabetes mellitus without complications: Secondary | ICD-10-CM

## 2014-01-21 DIAGNOSIS — K8010 Calculus of gallbladder with chronic cholecystitis without obstruction: Secondary | ICD-10-CM

## 2014-01-21 DIAGNOSIS — K811 Chronic cholecystitis: Secondary | ICD-10-CM | POA: Diagnosis present

## 2014-01-21 DIAGNOSIS — Z79899 Other long term (current) drug therapy: Secondary | ICD-10-CM | POA: Insufficient documentation

## 2014-01-21 DIAGNOSIS — K824 Cholesterolosis of gallbladder: Secondary | ICD-10-CM

## 2014-01-21 DIAGNOSIS — K8020 Calculus of gallbladder without cholecystitis without obstruction: Secondary | ICD-10-CM

## 2014-01-21 DIAGNOSIS — Z6838 Body mass index (BMI) 38.0-38.9, adult: Secondary | ICD-10-CM | POA: Insufficient documentation

## 2014-01-21 DIAGNOSIS — Z87891 Personal history of nicotine dependence: Secondary | ICD-10-CM | POA: Insufficient documentation

## 2014-01-21 DIAGNOSIS — K449 Diaphragmatic hernia without obstruction or gangrene: Secondary | ICD-10-CM | POA: Insufficient documentation

## 2014-01-21 DIAGNOSIS — R599 Enlarged lymph nodes, unspecified: Secondary | ICD-10-CM | POA: Insufficient documentation

## 2014-01-21 DIAGNOSIS — K7689 Other specified diseases of liver: Secondary | ICD-10-CM | POA: Insufficient documentation

## 2014-01-21 DIAGNOSIS — K5730 Diverticulosis of large intestine without perforation or abscess without bleeding: Secondary | ICD-10-CM | POA: Insufficient documentation

## 2014-01-21 DIAGNOSIS — K219 Gastro-esophageal reflux disease without esophagitis: Secondary | ICD-10-CM | POA: Insufficient documentation

## 2014-01-21 DIAGNOSIS — R109 Unspecified abdominal pain: Secondary | ICD-10-CM

## 2014-01-21 HISTORY — DX: Gastro-esophageal reflux disease without esophagitis: K21.9

## 2014-01-21 HISTORY — DX: Personal history of other diseases of the digestive system: Z87.19

## 2014-01-21 HISTORY — PX: CHOLECYSTECTOMY: SHX55

## 2014-01-21 HISTORY — DX: Type 2 diabetes mellitus without complications: E11.9

## 2014-01-21 LAB — GLUCOSE, CAPILLARY
GLUCOSE-CAPILLARY: 125 mg/dL — AB (ref 70–99)
GLUCOSE-CAPILLARY: 131 mg/dL — AB (ref 70–99)
Glucose-Capillary: 173 mg/dL — ABNORMAL HIGH (ref 70–99)
Glucose-Capillary: 159 mg/dL — ABNORMAL HIGH (ref 70–99)

## 2014-01-21 LAB — CBC
HCT: 48.9 % (ref 39.0–52.0)
Hemoglobin: 16.5 g/dL (ref 13.0–17.0)
MCH: 29.4 pg (ref 26.0–34.0)
MCHC: 33.7 g/dL (ref 30.0–36.0)
MCV: 87 fL (ref 78.0–100.0)
PLATELETS: 243 10*3/uL (ref 150–400)
RBC: 5.62 MIL/uL (ref 4.22–5.81)
RDW: 12.7 % (ref 11.5–15.5)
WBC: 9.1 10*3/uL (ref 4.0–10.5)

## 2014-01-21 LAB — BASIC METABOLIC PANEL
BUN: 10 mg/dL (ref 6–23)
CHLORIDE: 98 meq/L (ref 96–112)
CO2: 26 meq/L (ref 19–32)
Calcium: 9.5 mg/dL (ref 8.4–10.5)
Creatinine, Ser: 0.65 mg/dL (ref 0.50–1.35)
GFR calc non Af Amer: >90 mL/min (ref >90)
Glucose, Bld: 137 mg/dL — ABNORMAL HIGH (ref 70–99)
POTASSIUM: 4.4 meq/L (ref 3.7–5.3)
Sodium: 138 mEq/L (ref 137–147)

## 2014-01-21 SURGERY — LAPAROSCOPIC CHOLECYSTECTOMY WITH INTRAOPERATIVE CHOLANGIOGRAM
Anesthesia: General

## 2014-01-21 MED ORDER — HYDROMORPHONE HCL PF 1 MG/ML IJ SOLN
INTRAMUSCULAR | Status: AC
  Filled 2014-01-21: qty 1

## 2014-01-21 MED ORDER — NEOSTIGMINE METHYLSULFATE 1 MG/ML IJ SOLN
INTRAMUSCULAR | Status: DC | PRN
  Administered 2014-01-21: 5 mg via INTRAVENOUS

## 2014-01-21 MED ORDER — KETOROLAC TROMETHAMINE 30 MG/ML IJ SOLN
INTRAMUSCULAR | Status: DC | PRN
  Administered 2014-01-21: 30 mg via INTRAVENOUS

## 2014-01-21 MED ORDER — SUCCINYLCHOLINE CHLORIDE 20 MG/ML IJ SOLN
INTRAMUSCULAR | Status: DC | PRN
  Administered 2014-01-21: 140 mg via INTRAVENOUS

## 2014-01-21 MED ORDER — FENTANYL CITRATE 0.05 MG/ML IJ SOLN
INTRAMUSCULAR | Status: DC | PRN
  Administered 2014-01-21: 100 ug via INTRAVENOUS
  Administered 2014-01-21: 50 ug via INTRAVENOUS
  Administered 2014-01-21: 100 ug via INTRAVENOUS
  Administered 2014-01-21 (×3): 50 ug via INTRAVENOUS
  Administered 2014-01-21: 100 ug via INTRAVENOUS

## 2014-01-21 MED ORDER — ACETAMINOPHEN 325 MG PO TABS: 650.0000 mg | ORAL_TABLET | ORAL | Status: DC | PRN

## 2014-01-21 MED ORDER — ACETAMINOPHEN 10 MG/ML IV SOLN
1000.0000 mg | Freq: Once | INTRAVENOUS | Status: AC
  Administered 2014-01-21: 1000 mg via INTRAVENOUS
  Filled 2014-01-21: qty 100

## 2014-01-21 MED ORDER — KETOROLAC TROMETHAMINE 30 MG/ML IJ SOLN
30.0000 mg | Freq: Four times a day (QID) | INTRAMUSCULAR | Status: DC | PRN
  Filled 2014-01-21: qty 1

## 2014-01-21 MED ORDER — ONDANSETRON HCL 4 MG/2ML IJ SOLN
INTRAMUSCULAR | Status: DC | PRN
  Administered 2014-01-21: 4 mg via INTRAVENOUS

## 2014-01-21 MED ORDER — OXYCODONE-ACETAMINOPHEN 5-325 MG PO TABS
1.0000 | ORAL_TABLET | ORAL | Status: DC | PRN
  Administered 2014-01-21 – 2014-01-22 (×2): 2 via ORAL
  Filled 2014-01-21 (×2): qty 2

## 2014-01-21 MED ORDER — CISATRACURIUM BESYLATE (PF) 10 MG/5ML IV SOLN
INTRAVENOUS | Status: DC | PRN
Start: 2014-01-21 — End: 2014-01-21
  Administered 2014-01-21: 8 mg via INTRAVENOUS
  Administered 2014-01-21 (×2): 4 mg via INTRAVENOUS

## 2014-01-21 MED ORDER — PROMETHAZINE HCL 25 MG/ML IJ SOLN: 6.2500 mg | INTRAMUSCULAR | Status: DC | PRN

## 2014-01-21 MED ORDER — LIDOCAINE HCL (CARDIAC) 20 MG/ML IV SOLN
INTRAVENOUS | Status: DC | PRN
  Administered 2014-01-21: 100 mg via INTRAVENOUS

## 2014-01-21 MED ORDER — LACTATED RINGERS IR SOLN
Status: DC | PRN
  Administered 2014-01-21: 1000 mL

## 2014-01-21 MED ORDER — POTASSIUM CHLORIDE IN NACL 20-0.45 MEQ/L-% IV SOLN
INTRAVENOUS | Status: DC
  Administered 2014-01-21 – 2014-01-22 (×2): via INTRAVENOUS
  Filled 2014-01-21 (×3): qty 1000

## 2014-01-21 MED ORDER — PROPOFOL 10 MG/ML IV BOLUS
INTRAVENOUS | Status: DC | PRN
  Administered 2014-01-21: 200 mg via INTRAVENOUS

## 2014-01-21 MED ORDER — LACTATED RINGERS IV SOLN
INTRAVENOUS | Status: DC
  Administered 2014-01-21: 1000 mL via INTRAVENOUS
  Administered 2014-01-21: 14:00:00 via INTRAVENOUS

## 2014-01-21 MED ORDER — ENOXAPARIN SODIUM 40 MG/0.4ML ~~LOC~~ SOLN
40.0000 mg | SUBCUTANEOUS | Status: DC
  Administered 2014-01-22: 40 mg via SUBCUTANEOUS
  Filled 2014-01-21 (×2): qty 0.4

## 2014-01-21 MED ORDER — INSULIN ASPART 100 UNIT/ML ~~LOC~~ SOLN
0.0000 [IU] | SUBCUTANEOUS | Status: DC
Start: 2014-01-21 — End: 2014-01-22
  Administered 2014-01-21: 3 [IU] via SUBCUTANEOUS
  Administered 2014-01-22: 2 [IU] via SUBCUTANEOUS

## 2014-01-21 MED ORDER — ONDANSETRON HCL 4 MG PO TABS: 4.0000 mg | ORAL_TABLET | Freq: Four times a day (QID) | ORAL | Status: DC | PRN

## 2014-01-21 MED ORDER — DEXTROSE 5 % IV SOLN
2.0000 g | INTRAVENOUS | Status: AC
  Administered 2014-01-21: 2 g via INTRAVENOUS

## 2014-01-21 MED ORDER — DEXTROSE 5 % IV SOLN
INTRAVENOUS | Status: AC
  Filled 2014-01-21: qty 2

## 2014-01-21 MED ORDER — MORPHINE SULFATE 2 MG/ML IJ SOLN: 1.0000 mg | INTRAMUSCULAR | Status: DC | PRN

## 2014-01-21 MED ORDER — MIDAZOLAM HCL 5 MG/5ML IJ SOLN
INTRAMUSCULAR | Status: DC | PRN
  Administered 2014-01-21 (×2): 1 mg via INTRAVENOUS

## 2014-01-21 MED ORDER — BUPIVACAINE-EPINEPHRINE 0.25% -1:200000 IJ SOLN
INTRAMUSCULAR | Status: DC | PRN
  Administered 2014-01-21: 20 mL

## 2014-01-21 MED ORDER — BUPIVACAINE HCL (PF) 0.5 % IJ SOLN
INTRAMUSCULAR | Status: AC
  Filled 2014-01-21: qty 30

## 2014-01-21 MED ORDER — ONDANSETRON HCL 4 MG/2ML IJ SOLN: 4.0000 mg | Freq: Four times a day (QID) | INTRAMUSCULAR | Status: DC | PRN

## 2014-01-21 MED ORDER — GLYCOPYRROLATE 0.2 MG/ML IJ SOLN
INTRAMUSCULAR | Status: DC | PRN
  Administered 2014-01-21: .8 mg via INTRAVENOUS

## 2014-01-21 MED ORDER — LACTATED RINGERS IV SOLN: INTRAVENOUS | Status: DC

## 2014-01-21 MED ORDER — HYDROMORPHONE HCL PF 1 MG/ML IJ SOLN
0.2500 mg | INTRAMUSCULAR | Status: DC | PRN
  Administered 2014-01-21 (×3): 0.5 mg via INTRAVENOUS

## 2014-01-21 SURGICAL SUPPLY — 43 items
APPLIER CLIP 5 13 M/L LIGAMAX5 (MISCELLANEOUS) ×2
APPLIER CLIP ROT 10 11.4 M/L (STAPLE)
BANDAGE ADH SHEER 1  50/CT (GAUZE/BANDAGES/DRESSINGS) IMPLANT
BENZOIN TINCTURE PRP APPL 2/3 (GAUZE/BANDAGES/DRESSINGS) IMPLANT
CANISTER SUCTION 2500CC (MISCELLANEOUS) IMPLANT
CHLORAPREP W/TINT 26ML (MISCELLANEOUS) ×2 IMPLANT
CLIP APPLIE 5 13 M/L LIGAMAX5 (MISCELLANEOUS) ×1 IMPLANT
CLIP APPLIE ROT 10 11.4 M/L (STAPLE) IMPLANT
COVER MAYO STAND STRL (DRAPES) ×2 IMPLANT
COVER SURGICAL LIGHT HANDLE (MISCELLANEOUS) IMPLANT
DECANTER SPIKE VIAL GLASS SM (MISCELLANEOUS) IMPLANT
DERMABOND ADVANCED (GAUZE/BANDAGES/DRESSINGS) ×1
DERMABOND ADVANCED .7 DNX12 (GAUZE/BANDAGES/DRESSINGS) ×1 IMPLANT
DRAPE C-ARM 42X120 X-RAY (DRAPES) ×2 IMPLANT
DRAPE LAPAROSCOPIC ABDOMINAL (DRAPES) ×2 IMPLANT
DRAPE UTILITY XL STRL (DRAPES) ×2 IMPLANT
DRSG TEGADERM 2-3/8X2-3/4 SM (GAUZE/BANDAGES/DRESSINGS) IMPLANT
ELECT REM PT RETURN 9FT ADLT (ELECTROSURGICAL) ×2
ELECTRODE REM PT RTRN 9FT ADLT (ELECTROSURGICAL) ×1 IMPLANT
GLOVE BIO SURGEON STRL SZ7.5 (GLOVE) ×16 IMPLANT
GLOVE BIOGEL M STRL SZ7.5 (GLOVE) IMPLANT
GLOVE BIOGEL PI IND STRL 7.0 (GLOVE) IMPLANT
GLOVE BIOGEL PI INDICATOR 7.0 (GLOVE)
GLOVE INDICATOR 8.0 STRL GRN (GLOVE) IMPLANT
GOWN STRL REUS W/TWL LRG LVL3 (GOWN DISPOSABLE) ×4 IMPLANT
GOWN STRL REUS W/TWL XL LVL3 (GOWN DISPOSABLE) ×4 IMPLANT
KIT BASIN OR (CUSTOM PROCEDURE TRAY) ×2 IMPLANT
NS IRRIG 1000ML POUR BTL (IV SOLUTION) ×2 IMPLANT
POUCH SPECIMEN RETRIEVAL 10MM (ENDOMECHANICALS) ×2 IMPLANT
SCISSORS LAP 5X35 DISP (ENDOMECHANICALS) ×2 IMPLANT
SET CHOLANGIOGRAPH MIX (MISCELLANEOUS) ×2 IMPLANT
SET IRRIG TUBING LAPAROSCOPIC (IRRIGATION / IRRIGATOR) ×2 IMPLANT
SLEEVE XCEL OPT CAN 5 100 (ENDOMECHANICALS) ×2 IMPLANT
SOLUTION ANTI FOG 6CC (MISCELLANEOUS) ×2 IMPLANT
STRIP CLOSURE SKIN 1/2X4 (GAUZE/BANDAGES/DRESSINGS) IMPLANT
SUT MNCRL AB 4-0 PS2 18 (SUTURE) ×2 IMPLANT
SUT VICRYL 0 UR6 27IN ABS (SUTURE) ×2 IMPLANT
TOWEL OR 17X26 10 PK STRL BLUE (TOWEL DISPOSABLE) ×2 IMPLANT
TRAY LAP CHOLE (CUSTOM PROCEDURE TRAY) ×2 IMPLANT
TROCAR BLADELESS OPT 5 100 (ENDOMECHANICALS) ×2 IMPLANT
TROCAR BLADELESS OPT 5 75 (ENDOMECHANICALS) IMPLANT
TROCAR XCEL BLUNT TIP 100MML (ENDOMECHANICALS) ×2 IMPLANT
TUBING INSUFFLATION 10FT LAP (TUBING) ×2 IMPLANT

## 2014-01-21 NOTE — Interval H&P Note (Signed)
History and Physical Interval Note:  01/21/2014 12:11 PM  Jack ChuckRaul R Porter  has presented today for surgery, with the diagnosis of cholecystitis  The various methods of treatment have been discussed with the patient and family. After consideration of risks, benefits and other options for treatment, the patient has consented to  Procedure(s): LAPAROSCOPIC CHOLECYSTECTOMY WITH INTRAOPERATIVE CHOLANGIOGRAM (N/A) as a surgical intervention .  The patient's history has been reviewed, patient examined, no change in status, stable for surgery.  I have reviewed the patient's chart and labs.  Questions were answered to the patient's satisfaction.    Mary SellaEric M. Andrey CampanileWilson, MD, FACS General, Bariatric, & Minimally Invasive Surgery [facility], [place]  Atilano InaEric M Thresia Ramanathan

## 2014-01-21 NOTE — Anesthesia Preprocedure Evaluation (Signed)
Anesthesia Evaluation  Patient identified by MRN, date of birth, ID band Patient awake    Reviewed: Allergy & Precautions, H&P , NPO status , Patient's Chart, lab work & pertinent test results  Airway Mallampati: II TM Distance: >3 FB Neck ROM: Full    Dental  (+) Teeth Intact, Dental Advisory Given   Pulmonary neg pulmonary ROS, former smoker,  breath sounds clear to auscultation  Pulmonary exam normal       Cardiovascular negative cardio ROS  Rhythm:Regular Rate:Normal     Neuro/Psych negative neurological ROS  negative psych ROS   GI/Hepatic Neg liver ROS, hiatal hernia, GERD-  Medicated,  Endo/Other  negative endocrine ROSdiabetes, Type 2, Oral Hypoglycemic AgentsMorbid obesity  Renal/GU negative Renal ROS  negative genitourinary   Musculoskeletal negative musculoskeletal ROS (+)   Abdominal   Peds  Hematology negative hematology ROS (+)   Anesthesia Other Findings   Reproductive/Obstetrics                           Anesthesia Physical Anesthesia Plan  ASA: II and emergent  Anesthesia Plan: General   Post-op Pain Management:    Induction: Intravenous  Airway Management Planned: Oral ETT  Additional Equipment:   Intra-op Plan:   Post-operative Plan: Extubation in OR  Informed Consent: I have reviewed the patients History and Physical, chart, labs and discussed the procedure including the risks, benefits and alternatives for the proposed anesthesia with the patient or authorized representative who has indicated his/her understanding and acceptance.   Dental advisory given  Plan Discussed with: CRNA  Anesthesia Plan Comments:         Anesthesia Quick Evaluation

## 2014-01-21 NOTE — Op Note (Signed)
Jack ChuckRaul R Goracke [id number] 07/17/1981 01/21/2014  Laparoscopic Cholecystectomy Procedure Note  Indications: This patient presents with symptomatic gallbladder disease and will undergo laparoscopic cholecystectomy.Please see H&P for additional details  Pre-operative Diagnosis: Calculus of gallbladder without mention of cholecystitis or obstruction  Post-operative Diagnosis: Calculus of gallbladder with other cholecystitis, without mention of obstruction; Fatty liver  Surgeon: Atilano InaEric M Ransome Helwig   Assistants: none  Anesthesia: General endotracheal anesthesia  ASA Class: 3  Procedure Details  The patient was seen again in the Holding Room. The risks, benefits, complications, treatment options, and expected outcomes were discussed with the patient. The possibilities of reaction to medication, pulmonary aspiration, perforation of viscus, bleeding, recurrent infection, finding a normal gallbladder, the need for additional procedures, failure to diagnose a condition, the possible need to convert to an open procedure, and creating a complication requiring transfusion or operation were discussed with the patient. The likelihood of improving the patient's symptoms with return to their baseline status is good.  The patient and/or family concurred with the proposed plan, giving informed consent. The site of surgery properly noted. The patient was taken to Operating Room, identified as Jack Porter and the procedure verified as Laparoscopic Cholecystectomy with Intraoperative Cholangiogram. A Time Out was held and the above information confirmed. Antibiotic prophylaxis was administered.   Prior to the induction of general anesthesia, antibiotic prophylaxis was administered. General endotracheal anesthesia was then administered and tolerated well. After the induction, the abdomen was prepped with Chloraprep and draped in the sterile fashion. The patient was positioned in the supine position.  Local anesthetic  agent was injected into the skin near the umbilicus and an incision made. We dissected down to the abdominal fascia with blunt dissection.  The fascia was incised vertically and we entered the peritoneal cavity bluntly.  A pursestring suture of 0-Vicryl was placed around the fascial opening.  The Hasson cannula was inserted and secured with the stay suture.  Pneumoperitoneum was then created with CO2 and tolerated well without any adverse changes in the patient's vital signs. An 5-mm port was placed in the subxiphoid position.  Two 5-mm ports were placed in the right upper quadrant. All skin incisions were infiltrated with a local anesthetic agent before making the incision and placing the trocars.   We positioned the patient in reverse Trendelenburg, tilted slightly to the patient's left. The patient had a fatty liver. The gallbladder was identified, the fundus grasped and retracted cephalad. Adhesions were lysed bluntly and with the electrocautery where indicated, taking care not to injure any adjacent organs or viscus. The patient had an intrahepatic gallbladder and it was very long. There is a large amount of adipose tissue encasing the gallbladder especially around the infundibulum.The infundibulum was grasped and retracted laterally, exposing the peritoneum overlying the triangle of Calot. This was then divided and exposed in a blunt fashion. The patient cystic artery crossed anteriorly to his cystic duct. A critical view of the cystic duct and cystic artery was obtained.  There were no other structures entering the gallbladder.The cystic duct was clearly identified and bluntly dissected circumferentially. In order to attempt to do a cholangiogram, I had to take down the cystic artery first since it was crossing anteriorly to the duct. 2 clips were placed on the down side of the cystic artery and it was transected distally with the electrocautery. The cystic duct was ligated with a clip distally.   An  incision was made in the cystic duct and I was unable to introduce  the tip of the hook to dilate the cystic duct. The cystic duct was occluded. Therefore I made no further attempts to do a cholangiogram.   The cystic duct was then ligated with clips and divided.   The gallbladder was dissected from the liver bed in retrograde fashion with the electrocautery. The gallbladder was removed and placed in an Endocatch sac.  The gallbladder and Endocatch sac were then removed through the umbilical port site. The liver bed was irrigated and inspected. Hemostasis was achieved with the electrocautery. Copious irrigation was utilized and was repeatedly aspirated until clear.  The pursestring suture was used to close the umbilical fascia.  An additional figure-of-eight 0 Vicryl was placed at the umbilical fascia.  We again inspected the right upper quadrant for hemostasis.  The umbilical closure was inspected and there was no air leak and nothing trapped within the closure. Pneumoperitoneum was released as we removed the trocars.  4-0 Monocryl was used to close the skin.   Dermabond was applied. The patient was then extubated and brought to the recovery room in stable condition. Instrument, sponge, and needle counts were correct at closure and at the conclusion of the case.   Findings: Chronic Cholecystitis with Cholelithiasis; intrahepatic gallbladder, fatty liver  Estimated Blood Loss: Minimal         Drains: none         Specimens: Gallbladder           Complications: None; patient tolerated the procedure well.         Disposition: PACU - hemodynamically stable.         Condition: stable  Mary SellaEric M. Andrey CampanileWilson, MD, FACS General, Bariatric, & Minimally Invasive Surgery [facility], [place]

## 2014-01-21 NOTE — Transfer of Care (Signed)
Immediate Anesthesia Transfer of Care Note  Patient: Jack Porter  Procedure(s) Performed: Procedure(s): LAPAROSCOPIC CHOLECYSTECTOMY (N/A)  Patient Location: PACU  Anesthesia Type:General  Level of Consciousness: awake, alert , oriented, patient cooperative and responds to stimulation  Airway & Oxygen Therapy: Patient Spontanous Breathing and Patient connected to face mask oxygen  Post-op Assessment: Report given to PACU RN, Post -op Vital signs reviewed and stable and Patient moving all extremities  Post vital signs: Reviewed and stable  Complications: No apparent anesthesia complications

## 2014-01-21 NOTE — Anesthesia Postprocedure Evaluation (Signed)
Anesthesia Post Note  Patient: Jack Porter  Procedure(s) Performed: Procedure(s) (LRB): LAPAROSCOPIC CHOLECYSTECTOMY (N/A)  Anesthesia type: General  Patient location: PACU  Post pain: Pain level controlled  Post assessment: Post-op Vital signs reviewed  Last Vitals:  Filed Vitals:   01/21/14 1545  BP: 151/92  Pulse:   Temp:   Resp:     Post vital signs: Reviewed  Level of consciousness: sedated  Complications: No apparent anesthesia complications

## 2014-01-21 NOTE — H&P (Signed)
Feels like stomach twisting in knots, pain in RUQ rad to LUQ and LLQ, associated with nausea and small amount of emesis; last about 2 hrs. Been going on for about 1 months. Initially intermittent and now daily. Worse after po. Planned wt loss. Irregular stools. Some subjective chills. No history of IBD. Denies CP/SOB/orthopnea, PND  No prior symptoms before 1 month ago.   Alert, nad Nontoxic cta  Soft, obese, mild RUQ TTP. No guarding/RT.    Labs and ct reviewed.  Nonspecific gastrohepatic small LN Cholelithiasis Diverticular disease but no diverticulitis  Obesity DM2 GERD Symptomatic cholelithiasis with failure of outpt management  I believe the majority of the patient's symptoms are consistent with gallbladder disease. However the radiation to the LLQ is atypical.   We discussed gallbladder disease.   I discussed laparoscopic cholecystectomy with IOC in detail.  The patient was given educational material as well as diagrams detailing the procedure.  We discussed the risks and benefits of a laparoscopic cholecystectomy including, but not limited to bleeding, infection, injury to surrounding structures such as the intestine or liver, bile leak, retained gallstones, need to convert to an open procedure, prolonged diarrhea, blood clots such as  DVT, common bile duct injury, anesthesia risks, failure to ameliorate his pain, and possible need for additional procedures.  We discussed the typical post-operative recovery course. I explained that the likelihood of improvement of their symptoms is fair to good.  He has elected to proceed with surgery  Mary SellaEric M. Andrey CampanileWilson, MD, FACS General, Bariatric, & Minimally Invasive Surgery [facility], [place]

## 2014-01-21 NOTE — Telephone Encounter (Signed)
Called patient to let him know Dr Andrey CampanileWilson will be calling him soon and advised not to eat or drink anything, per Dr Cleta Albertsaub .

## 2014-01-21 NOTE — H&P (Signed)
Jack Porter is an 33 y.o. male.   Chief Complaint: Abdominal pain   HPI: Patient is a 33 year old male who reports abdominal pain for the last 2 months. He has episodes almost every day, usually they come on at night. They're accompanied by nausea, vomiting, and sometimes diarrhea..  Pain is primarily left lower quadrant. He has lost about 33 pounds;  going from 309 down to 276 over the last month. He is being followed by Dr. Arlyss Queen, who has started him for his diabetes approximately 2 months ago. This is a new diagnosis for him. He was seen yesterday urgent care by Dr. Everlene Farrier. CMP and CBC were both normal. He then underwent a CT scan which showed no acute findings. He has mild increased stool in the colon some diverticula with no diverticulitis. Hepatic steatosis. He has an enlarged gastrohepatic ligament nodes mild haziness through the small bowel mesentery which is a chronic finding, gallstones are present there is no evidence of acute cholecystitis and mild degenerative changes visualized in the spine. Despite pain medication he continues to have pain, and nausea. He only had a chicken sandwich last PM.  He was referred to [facility] and Dr. Harlow Asa discussed him yesterday with Dr. Everlene Farrier.  He returns with worsening abdominal pain and we were ask to see for biliary colic.    Past Medical History  Diagnosis Date  . Diabetes mellitus without complication   . GERD (gastroesophageal reflux disease)    Hx of tobacco use    . H/O hiatal hernia      Body mass index is 38.51 kg/(m^2).   And a  Past Surgical History  Procedure Laterality Date   None  01/21/2014    Family History  Problem Relation Age of Onset  . Diabetes Mother    Social History:  reports that he has quit smoking. His smoking use included Cigarettes. He has a .75 pack-year smoking history. He does not have any smokeless tobacco history on file. He reports that he does not drink alcohol or use illicit drugs.  Allergies: No Known  Allergies  Medications Prior to Admission  Medication Sig Dispense Refill  . bismuth subsalicylate (PEPTO BISMOL) 262 MG/15ML suspension Take 30 mLs by mouth every 6 (six) hours as needed.      . Famotidine (PEPCID AC MAXIMUM STRENGTH) 20 MG CHEW Chew 1 tablet by mouth once.      Marland Kitchen glipiZIDE (GLUCOTROL XL) 5 MG 24 hr tablet Take 1 tablet (5 mg total) by mouth daily with breakfast.  30 tablet  11  . omeprazole (PRILOSEC) 40 MG capsule Take 1 capsule (40 mg total) by mouth daily.  30 capsule  11  . ondansetron (ZOFRAN-ODT) 8 MG disintegrating tablet Take 1 tablet (8 mg total) by mouth every 8 (eight) hours as needed for nausea.  20 tablet  0  . traMADol (ULTRAM) 50 MG tablet Take 1 tablet (50 mg total) by mouth every 6 (six) hours as needed.  30 tablet  0    Results for orders placed during the hospital encounter of 01/21/14 (from the past 48 hour(s))  BASIC METABOLIC PANEL     Status: Abnormal   Collection Time    01/21/14 10:00 AM      Result Value Ref Range   Sodium 138  137 - 147 mEq/L   Potassium 4.4  3.7 - 5.3 mEq/L   Chloride 98  96 - 112 mEq/L   CO2 26  19 - 32 mEq/L   Glucose,  Bld 137 (*) 70 - 99 mg/dL   BUN 10  6 - 23 mg/dL   Creatinine, Ser 0.65  0.50 - 1.35 mg/dL   Calcium 9.5  8.4 - 10.5 mg/dL   GFR calc non Af Amer >90  >90 mL/min   GFR calc Af Amer >90  >90 mL/min   Comment: (NOTE)     The eGFR has been calculated using the CKD EPI equation.     This calculation has not been validated in all clinical situations.     eGFR's persistently <90 mL/min signify possible Chronic Kidney     Disease.  GLUCOSE, CAPILLARY     Status: Abnormal   Collection Time    01/21/14 10:16 AM      Result Value Ref Range   Glucose-Capillary 125 (*) 70 - 99 mg/dL  CBC     Status: None   Collection Time    01/21/14  3:25 PM      Result Value Ref Range   WBC 9.1  4.0 - 10.5 K/uL   RBC 5.62  4.22 - 5.81 MIL/uL   Hemoglobin 16.5  13.0 - 17.0 g/dL   HCT 48.9  39.0 - 52.0 %   MCV 87.0  78.0  - 100.0 fL   MCH 29.4  26.0 - 34.0 pg   MCHC 33.7  30.0 - 36.0 g/dL   RDW 12.7  11.5 - 15.5 %   Platelets 243  150 - 400 K/uL   Ct Abdomen Pelvis W Contrast  01/20/2014   CLINICAL DATA:  Mid abdominal pain for 4 weeks. Nausea and constipation.  EXAM: CT ABDOMEN AND PELVIS WITH CONTRAST  TECHNIQUE: Multidetector CT imaging of the abdomen and pelvis was performed using the standard protocol following bolus administration of intravenous contrast.  CONTRAST:  14m OMNIPAQUE IOHEXOL 300 MG/ML  SOLN  COMPARISON:  None.  FINDINGS: Clear lung bases.  The heart is normal in size.  Liver is of low attenuation consistent with fatty infiltration. No liver mass or lesion seen.  Multiple gallstones. No gallbladder wall thickening or pericholecystic inflammation. No bile duct dilation.  Normal spleen and pancreas. No adrenal masses. Normal kidneys, ureters and the allow.  There are enlarged left was along the gastrohepatic ligament. Reference measurement of a node anterior to the portal vein and superior to the pancreas measures 17 mm in short axis. No other adenopathy.  There are scattered left colon diverticula. No diverticulitis. Stool in the colon with mild increased. Normal small bowel. Normal appendix. Is mal hazy opacity in the fat at the root of the small bowel mesentery. This is nonspecific. There are multiple small mesenteric lymph nodes. None are enlarged. This is likely a chronic finding.  No ascites. Mild degenerative changes of the lower thoracic and lower lumbar spine. No other bony abnormality.  IMPRESSION: 1. No acute findings. 2. Mild increased stool in the colon. Scattered left colon diverticula. No diverticulitis. 3. Hepatic steatosis. 4. Enlarged gastrohepatic ligament lymph nodes. These are likely reactive. 5. Mild haziness in the root of the small bowel mesentery, which is likely a chronic finding. There are associated small lymph nodes. 6. Gallstones.  No evidence of acute cholecystitis. 7. Mild  degenerative changes of the visualized spine.   Electronically Signed   By: DLajean ManesM.D.   On: 01/20/2014 15:57    Review of Systems  Constitutional: Positive for weight loss (33 pound weight loss over the last 4 weeks).  HENT: Negative.  Wears glasses  Eyes: Negative.   Respiratory: Negative.   Cardiovascular: Negative.        Some DOE, but he attributes it to deconditioning.  Gastrointestinal: Positive for heartburn, nausea, vomiting, abdominal pain and diarrhea (diarrhea comes after he vomits.).  Genitourinary: Negative.   Musculoskeletal: Negative.   Skin: Negative.   Endo/Heme/Allergies: Negative.        He has been on diabetic medicines for the last 2 months.  Psychiatric/Behavioral: Negative.     Blood pressure 139/90, pulse 108, temperature 98.3 F (36.8 C), temperature source Oral, resp. rate 18, height _0  (1.803 m), weight 125.193 kg (276 lb), SpO2 100.00%. Physical Exam  Constitutional: He is oriented to person, place, and time. He appears well-developed and well-nourished. No distress.  BP 139/90  Pulse 108  Temp(Src) 98.3 F (36.8 C) (Oral)  Resp 18  Ht _1  (1.803 m)  Wt 125.193 kg (276 lb)  BMI 38.51 kg/m2  SpO2 100%   HENT:  Head: Normocephalic and atraumatic.  Nose: Nose normal.  Eyes: Conjunctivae and EOM are normal. Pupils are equal, round, and reactive to light. Right eye exhibits no discharge. Left eye exhibits no discharge. No scleral icterus.  Neck: Normal range of motion. Neck supple. No JVD present. No tracheal deviation present. No thyromegaly present.  Cardiovascular: Normal rate, regular rhythm, normal heart sounds and intact distal pulses.  Exam reveals no gallop.   No murmur heard. Respiratory: Effort normal and breath sounds normal. No respiratory distress. He has no wheezes. He has no rales. He exhibits no tenderness.  GI: Soft. Bowel sounds are normal. He exhibits no distension and no mass. There is tenderness (discomfort is  primiarly left side.). There is no rebound and no guarding.  Musculoskeletal: He exhibits no edema and no tenderness.  Lymphadenopathy:    He has no cervical adenopathy.  Neurological: He is alert and oriented to person, place, and time. No cranial nerve deficit.  Skin: Skin is warm and dry. No rash noted. He is not diaphoretic. No erythema. No pallor.  Psychiatric: He has a normal mood and affect. His behavior is normal. Judgment and thought content normal.     Assessment/Plan 1. Biliary colic 2. Adult Onset diabetes type 2  (started treatment 2 months ago) 3.GERD/hiatal hernia 4.  Hx of tobacco use 5.  BMI 38.5  Plan:  He is admitted for elecvtive cholecystyectomy  Earnstine Regal 01/21/2014, 10:44 AM

## 2014-01-21 NOTE — Anesthesia Procedure Notes (Signed)
Procedure Name: Intubation Date/Time: 01/21/2014 1:03 PM Performed by: Edison PaceGRAY, Ellora Varnum E Pre-anesthesia Checklist: Patient identified, Patient being monitored, Emergency Drugs available, Timeout performed and Suction available Patient Re-evaluated:Patient Re-evaluated prior to inductionOxygen Delivery Method: Circle system utilized Preoxygenation: Pre-oxygenation with 100% oxygen Intubation Type: IV induction and Cricoid Pressure applied Ventilation: Two handed mask ventilation required Laryngoscope Size: Mac and 4 Grade View: Grade III Tube type: Oral (limited opening, huge tongue/pharyngeal tissue, arytenoids visualized with cricoid pressure/head lift.) Tube size: 7.5 mm Number of attempts: 1 Airway Equipment and Method: Stylet Placement Confirmation: ETT inserted through vocal cords under direct vision,  positive ETCO2 and breath sounds checked- equal and bilateral Secured at: 21 cm Tube secured with: Tape Dental Injury: Teeth and Oropharynx as per pre-operative assessment  Difficulty Due To: Difficulty was anticipated, Difficult Airway- due to large tongue, Difficult Airway- due to reduced neck mobility, Difficult Airway- due to limited oral opening and Difficult Airway- due to anterior larynx Future Recommendations: Recommend- induction with short-acting agent, and alternative techniques readily available

## 2014-01-21 NOTE — Discharge Instructions (Signed)
Laparoscopic Cholecystectomy, Care After °Refer to this sheet in the next few weeks. These instructions provide you with information on caring for yourself after your procedure. Your health care provider may also give you more specific instructions. Your treatment has been planned according to current medical practices, but problems sometimes occur. Call your health care provider if you have any problems or questions after your procedure. °WHAT TO EXPECT AFTER THE PROCEDURE °After your procedure, it is typical to have the following: °· Pain at your incision sites. You will be given pain medicines to control the pain. °· Mild nausea or vomiting. This should improve after the first 24 hours. °· Bloating and possibly shoulder pain from the gas used during the procedure. This will improve after the first 24 hours. °HOME CARE INSTRUCTIONS  °· Change bandages (dressings) as directed by your health care provider. °· Keep the wound dry and clean. You may wash the wound gently with soap and water. Gently blot or dab the area dry. °· Do not take baths or use swimming pools or hot tubs for 2 weeks or until your health care provider approves. °· Only take over-the-counter or prescription medicines as directed by your health care provider. °· Continue your normal diet as directed by your health care provider. °· Do not lift anything heavier than 10 pounds (4.5 kg) until your health care provider approves. °· Do not play contact sports for 1 week or until your health care provider approves. °SEEK MEDICAL CARE IF:  °· You have redness, swelling, or increasing pain in the wound. °· You notice yellowish-white fluid (pus) coming from the wound. °· You have drainage from the wound that lasts longer than 1 day. °· You notice a bad smell coming from the wound or dressing. °· Your surgical cuts (incisions) break open. °SEEK IMMEDIATE MEDICAL CARE IF:  °· You develop a rash. °· You have difficulty breathing. °· You have chest pain. °· You  have a fever. °· You have increasing pain in the shoulders (shoulder strap areas). °· You have dizzy episodes or faint while standing. °· You have severe abdominal pain. °· You feel sick to your stomach (nauseous) or throw up (vomit) and this lasts for more than 1 day. °Document Released: 09/23/2005 Document Revised: 07/14/2013 Document Reviewed: 05/05/2013 °ExitCare® Patient Information ©2014 [employer]. ° °[facility] ______[facility] °LAPAROSCOPIC SURGERY: POST OP INSTRUCTIONS °Always review your discharge instruction sheet given to you by the facility where your surgery was performed. °IF YOU HAVE DISABILITY OR FAMILY LEAVE FORMS, YOU MUST BRING THEM TO THE OFFICE FOR PROCESSING.   °DO NOT GIVE THEM TO YOUR DOCTOR. ° °1. A prescription for pain medication may be given to you upon discharge.  Take your pain medication as prescribed, if needed.  If narcotic pain medicine is not needed, then you may take acetaminophen (Tylenol) or ibuprofen (Advil) as needed. °2. Take your usually prescribed medications unless otherwise directed. °3. If you need a refill on your pain medication, please contact your pharmacy.  They will contact our office to request authorization. Prescriptions will not be filled after 5pm or on week-ends. °4. You should follow a light diet the first few days after arrival home, such as soup and crackers, etc.  Be sure to include lots of fluids daily. °5. Most patients will experience some swelling and bruising in the area of the incisions.  Ice packs will help.  Swelling and bruising can take several days to resolve.  °6. It is common   to experience some constipation if taking pain medication after surgery.  Increasing fluid intake and taking a stool softener (such as Colace) will usually help or prevent this problem from occurring.  A mild laxative (Milk of Magnesia or Miralax) should be taken according to package instructions if there are no bowel movements after 48  hours. °7. Unless discharge instructions indicate otherwise, you may remove your bandages 24-48 hours after surgery, and you may shower at that time.  You may have steri-strips (small skin tapes) in place directly over the incision.  These strips should be left on the skin for 7-10 days.  If your surgeon used skin glue on the incision, you may shower in 24 hours.  The glue will flake off over the next 2-3 weeks.  Any sutures or staples will be removed at the office during your follow-up visit. °8. ACTIVITIES:  You may resume regular (light) daily activities beginning the next day--such as daily self-care, walking, climbing stairs--gradually increasing activities as tolerated.  You may have sexual intercourse when it is comfortable.  Refrain from any heavy lifting or straining until approved by your doctor. °a. You may drive when you are no longer taking prescription pain medication, you can comfortably wear a seatbelt, and you can safely maneuver your car and apply brakes. °b. RETURN TO WORK:  __________________________________________________________ °9. You should see your doctor in the office for a follow-up appointment approximately 2-3 weeks after your surgery.  Make sure that you call for this appointment within a day or two after you arrive home to insure a convenient appointment time. °10. OTHER INSTRUCTIONS: __________________________________________________________________________________________________________________________ __________________________________________________________________________________________________________________________ °WHEN TO CALL YOUR DOCTOR: °1. Fever over 101.0 °2. Inability to urinate °3. Continued bleeding from incision. °4. Increased pain, redness, or drainage from the incision. °5. Increasing abdominal pain ° °The clinic staff is available to answer your questions during regular business hours.  Please don’t hesitate to call and ask to speak to one of the nurses for  clinical concerns.  If you have a medical emergency, go to the nearest emergency room or call 911.  A surgeon from [facility] is always on call at the hospital. °[address], [address], [place], [place]  [address] ? [address] [address], [place], [place]   [address] °[phone] ? [phone] ? FAX [phone] °Web site: [email or web address] ° °

## 2014-01-22 LAB — GLUCOSE, CAPILLARY
GLUCOSE-CAPILLARY: 116 mg/dL — AB (ref 70–99)
GLUCOSE-CAPILLARY: 113 mg/dL — AB (ref 70–99)

## 2014-01-22 MED ORDER — OXYCODONE-ACETAMINOPHEN 5-325 MG PO TABS: 1.0000 | ORAL_TABLET | ORAL | Status: DC | PRN

## 2014-01-22 NOTE — Progress Notes (Signed)
1 Day Post-Op  Subjective: Doing ok  Objective: Vital signs in last 24 hours: Temp:  [97.8 F (36.6 C)-100 F (37.8 C)] 97.9 F (36.6 C) (04/18 0531) Pulse Rate:  [83-121] 88 (04/18 0531) Resp:  [14-18] 16 (04/18 0531) BP: (105-157)/(71-94) 114/74 mmHg (04/18 0531) SpO2:  [97 %-100 %] 97 % (04/18 0531) Weight:  [276 lb (125.193 kg)] 276 lb (125.193 kg) (04/17 0939) Last BM Date: 01/21/14  Intake/Output from previous day: 04/17 0701 - 04/18 0700 In: 3676.7 [P.O.:360; I.V.:3316.7] Out: 300 [Urine:300] Intake/Output this shift:    Incision/Wound:C/D/I/ SOFT MIN TENDERNESS  Lab Results:   Recent Labs  01/20/14 1316 01/21/14 1525  WBC 7.6 9.1  HGB 15.7 16.5  HCT 48.3 48.9  PLT  --  243   BMET  Recent Labs  01/19/14 1034 01/21/14 1000  NA 138 138  K 4.6 4.4  CL 102 98  CO2 25 26  GLUCOSE 138* 137*  BUN 11 10  CREATININE 0.61 0.65  CALCIUM 9.4 9.5   PT/INR No results found for this basename: LABPROT, INR,  in the last 72 hours ABG No results found for this basename: PHART, PCO2, PO2, HCO3,  in the last 72 hours  Studies/Results: Ct Abdomen Pelvis W Contrast  01/20/2014   CLINICAL DATA:  Mid abdominal pain for 4 weeks. Nausea and constipation.  EXAM: CT ABDOMEN AND PELVIS WITH CONTRAST  TECHNIQUE: Multidetector CT imaging of the abdomen and pelvis was performed using the standard protocol following bolus administration of intravenous contrast.  CONTRAST:  125mL OMNIPAQUE IOHEXOL 300 MG/ML  SOLN  COMPARISON:  None.  FINDINGS: Clear lung bases.  The heart is normal in size.  Liver is of low attenuation consistent with fatty infiltration. No liver mass or lesion seen.  Multiple gallstones. No gallbladder wall thickening or pericholecystic inflammation. No bile duct dilation.  Normal spleen and pancreas. No adrenal masses. Normal kidneys, ureters and the allow.  There are enlarged left was along the gastrohepatic ligament. Reference measurement of a node anterior to the  portal vein and superior to the pancreas measures 17 mm in short axis. No other adenopathy.  There are scattered left colon diverticula. No diverticulitis. Stool in the colon with mild increased. Normal small bowel. Normal appendix. Is mal hazy opacity in the fat at the root of the small bowel mesentery. This is nonspecific. There are multiple small mesenteric lymph nodes. None are enlarged. This is likely a chronic finding.  No ascites. Mild degenerative changes of the lower thoracic and lower lumbar spine. No other bony abnormality.  IMPRESSION: 1. No acute findings. 2. Mild increased stool in the colon. Scattered left colon diverticula. No diverticulitis. 3. Hepatic steatosis. 4. Enlarged gastrohepatic ligament lymph nodes. These are likely reactive. 5. Mild haziness in the root of the small bowel mesentery, which is likely a chronic finding. There are associated small lymph nodes. 6. Gallstones.  No evidence of acute cholecystitis. 7. Mild degenerative changes of the visualized spine.   Electronically Signed   By: Jack Portlandavid  Porter M.D.   On: 01/20/2014 15:57    Anti-infectives: Anti-infectives   Start     Dose/Rate Route Frequency Ordered Stop   01/21/14 0945  cefOXitin (MEFOXIN) 2 g in dextrose 5 % 50 mL IVPB     2 g 100 mL/hr over 30 Minutes Intravenous On call to O.R. 01/21/14 0939 01/21/14 1245      Assessment/Plan: s/p Procedure(s): LAPAROSCOPIC CHOLECYSTECTOMY (N/A) Discharge  LOS: 1 day    Jack Porter A.  Jack Porter 01/22/2014

## 2014-01-22 NOTE — Discharge Summary (Signed)
Physician Discharge Summary  Patient ID: Jack Porter MRN: [id number] DOB/AGE: 34/02/1981 34 y.o.  Admit date: 01/21/2014 Discharge date: 01/22/2014  Admission Diagnoses: CHRONIC CHOLECYSTITIS  Discharge Diagnoses: SAME Active Problems:   Chronic cholecystitis   Discharged Condition: good  Hospital Course:unremarkable.  Tolerated laparoscopic cholecystectomy without difficulty.  No events overnight. Discharge home.   Consults: None  Significant Diagnostic Studies: labs:  CBC    Component Value Date/Time   WBC 9.1 01/21/2014 1525   WBC 7.6 01/20/2014 1316   RBC 5.62 01/21/2014 1525   RBC 5.39 01/20/2014 1316   HGB 16.5 01/21/2014 1525   HGB 15.7 01/20/2014 1316   HCT 48.9 01/21/2014 1525   HCT 48.3 01/20/2014 1316   PLT 243 01/21/2014 1525   MCV 87.0 01/21/2014 1525   MCV 89.7 01/20/2014 1316   MCH 29.4 01/21/2014 1525   MCH 29.1 01/20/2014 1316   MCHC 33.7 01/21/2014 1525   MCHC 32.5 01/20/2014 1316   RDW 12.7 01/21/2014 1525     Treatments: surgery: lap chole   Discharge Exam: Blood pressure 114/74, pulse 88, temperature 97.9 F (36.6 C), temperature source Oral, resp. rate 16, height 5\' 11"  (1.803 m), weight 276 lb (125.193 kg), SpO2 97.00%. Incision/Wound:SOFT Min tender  Incisions C/D/I  Disposition: Final discharge disposition not confirmed  Discharge Orders   Future Orders Complete By Expires   Diet - low sodium heart healthy  As directed    Increase activity slowly  As directed        Medication List         bismuth subsalicylate 262 MG/15ML suspension  Commonly known as:  PEPTO BISMOL  Take 30 mLs by mouth every 6 (six) hours as needed.     glipiZIDE 5 MG 24 hr tablet  Commonly known as:  GLUCOTROL XL  Take 1 tablet (5 mg total) by mouth daily with breakfast.     omeprazole 40 MG capsule  Commonly known as:  PRILOSEC  Take 1 capsule (40 mg total) by mouth daily.     ondansetron 8 MG disintegrating tablet  Commonly known as:  ZOFRAN-ODT  Take 1  tablet (8 mg total) by mouth every 8 (eight) hours as needed for nausea.     oxyCODONE-acetaminophen 5-325 MG per tablet  Commonly known as:  PERCOCET/ROXICET  Take 1-2 tablets by mouth every 4 (four) hours as needed for moderate pain.     PEPCID AC MAXIMUM STRENGTH 20 MG Chew  Generic drug:  Famotidine  Chew 1 tablet by mouth once.     traMADol 50 MG tablet  Commonly known as:  ULTRAM  Take 1 tablet (50 mg total) by mouth every 6 (six) hours as needed.           Follow-up Information   Follow up with Atilano InaWILSON,ERIC M, MD. Schedule an appointment as soon as possible for a visit in 2 weeks. (Call Monday for an appointment in 2-3 weeks.  No lifting over 20 pounds for 2 weeks.)    Specialty:  General Surgery   Contact information:   [address] [place] [address] [phone]       Signed: [name] 01/22/2014, 9:26 AM

## 2014-01-24 ENCOUNTER — Encounter (HOSPITAL_COMMUNITY): Payer: Self-pay | Admitting: General Surgery

## 2014-02-03 ENCOUNTER — Telehealth (INDEPENDENT_AMBULATORY_CARE_PROVIDER_SITE_OTHER): Payer: Self-pay | Admitting: General Surgery

## 2014-02-03 ENCOUNTER — Encounter (INDEPENDENT_AMBULATORY_CARE_PROVIDER_SITE_OTHER): Payer: Self-pay | Admitting: General Surgery

## 2014-02-03 NOTE — Telephone Encounter (Signed)
LMOM for patient to come by the office and pick up letter for work

## 2014-02-17 ENCOUNTER — Encounter (INDEPENDENT_AMBULATORY_CARE_PROVIDER_SITE_OTHER): Payer: 59 | Admitting: General Surgery

## 2014-03-02 ENCOUNTER — Ambulatory Visit (INDEPENDENT_AMBULATORY_CARE_PROVIDER_SITE_OTHER): Payer: 59 | Admitting: General Surgery

## 2014-03-02 ENCOUNTER — Encounter (INDEPENDENT_AMBULATORY_CARE_PROVIDER_SITE_OTHER): Payer: Self-pay | Admitting: General Surgery

## 2014-03-02 VITALS — BP 125/81 | HR 66 | Temp 97.6°F | Resp 14 | Ht 71.0 in | Wt 271.8 lb

## 2014-03-02 DIAGNOSIS — Z09 Encounter for follow-up examination after completed treatment for conditions other than malignant neoplasm: Secondary | ICD-10-CM

## 2014-03-02 NOTE — Patient Instructions (Signed)
Slowly resume heavy lifting Avoid fatty greasy foods Try eating smaller meals

## 2014-03-03 NOTE — Progress Notes (Signed)
Subjective:     Patient ID: Jack Porter, male   DOB: [birth year], 33 y.o.   MRN: [id number]  HPI 33 year old male comes in for followup after undergoing laparoscopic cholecystectomy with cholangiogram on April 17. He states he is doing well. He still has some occasional abdominal discomfort on the right side but it is mild and intermittent. He denies any fevers or chills. He reports bowel movements. He has some occasional nausea. He reports a normal appetite. He reports normal energy level. He denies any jaundice.   Review of Systems     Objective:   Physical Exam BP 125/81  Pulse 66  Temp(Src) 97.6 F (36.4 C) (Temporal)  Resp 14  Ht 5\' 11"  (1.803 m)  Wt 271 lb 12.8 oz (123.288 kg)  BMI 37.93 kg/m2  Gen: alert, NAD, non-toxic appearing Pupils: equal, no scleral icterus Pulm: Lungs clear to auscultation, symmetric chest rise CV: regular rate and rhythm Abd: soft, nontender, nondistended. Well-healed trocar sites. No cellulitis. No incisional hernia Ext: no edema,  Skin: no rash, no jaundice     Assessment:     Status post laparoscopic cholecystectomy with cholangiogram     Plan:     Overall I think he is doing well. He looks well. We reviewed his pathology report which showed chronic cholecystitis and cholelithiasis. He was given a note for work to avoid heavy lifting for another 2 weeks. Followup as needed  Mary Sella. Andrey Campanile, MD, FACS General, Bariatric, & Minimally Invasive Surgery [facility], [place]

## 2014-06-22 ENCOUNTER — Other Ambulatory Visit: Payer: Self-pay

## 2014-06-22 MED ORDER — OMEPRAZOLE 40 MG PO CPDR: 40.0000 mg | DELAYED_RELEASE_CAPSULE | Freq: Every day | ORAL | Status: DC

## 2014-06-22 MED ORDER — GLIPIZIDE ER 5 MG PO TB24: 5.0000 mg | ORAL_TABLET | Freq: Every day | ORAL | Status: DC

## 2014-07-24 IMAGING — CT CT ABD-PELV W/ CM
2 of 4 series · 17 of 46 positions shown, 19 images · IV contrast (READICAT/WATER & [ID] OMNI 300)
Comparison: None.

CLINICAL DATA: Mid abdominal pain for 4 weeks. Nausea and
constipation.

EXAM:
CT ABDOMEN AND PELVIS WITH CONTRAST
TECHNIQUE: Multidetector CT imaging of the abdomen and pelvis was performed
using the standard protocol following bolus administration of
intravenous contrast.
CONTRAST:  125mL OMNIPAQUE IOHEXOL 300 MG/ML  SOLN

[Series 2: abd/pelvis with · axial · 0.88mm/px · z∈[-450,+40]mm · 14 of 108 slices shown, 16 images]
[im 5/108  soft-tissue]
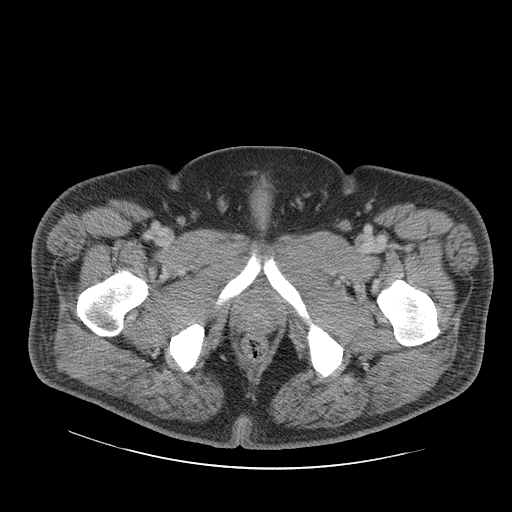
[im 5/108  bone]
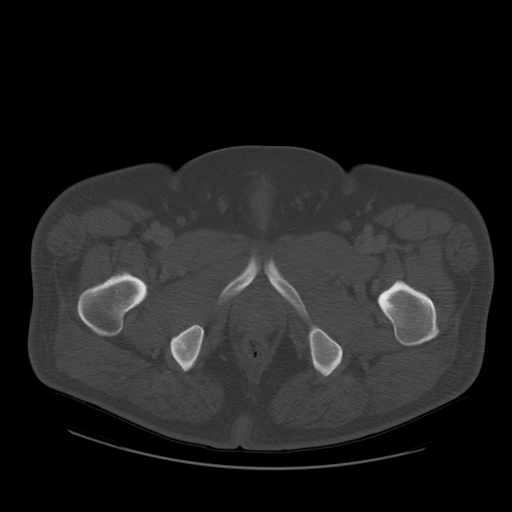
[im 14/108  soft-tissue]
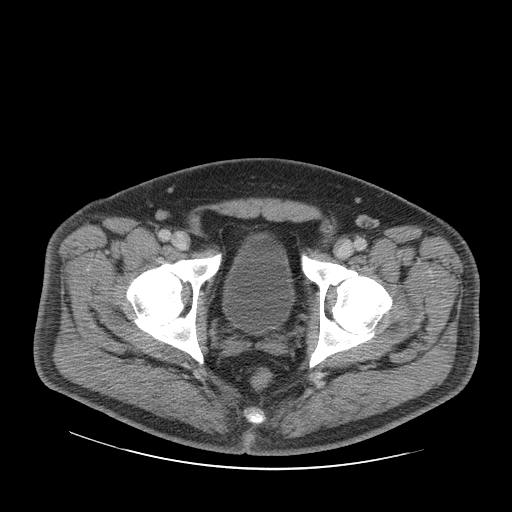
[im 23/108  soft-tissue]
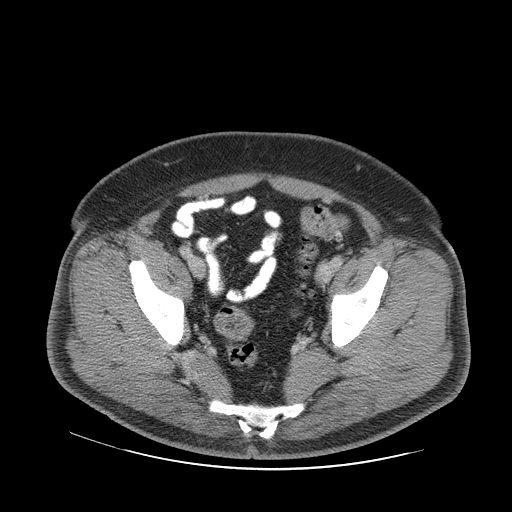
[im 27/108  soft-tissue]
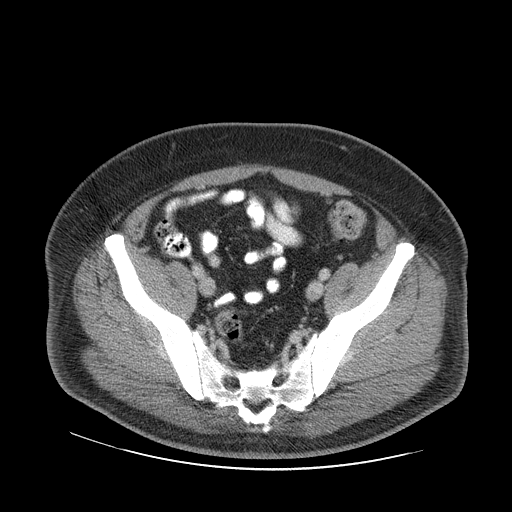
[im 36/108  soft-tissue]
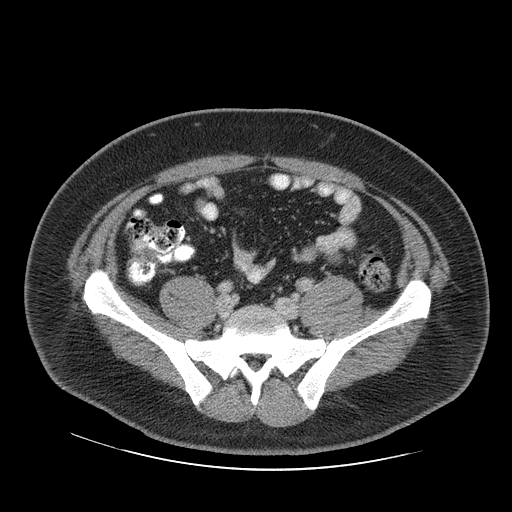
[im 45/108  soft-tissue]
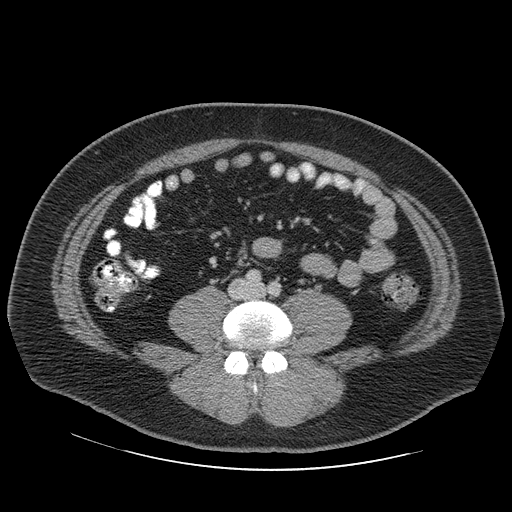
[im 50/108  soft-tissue]
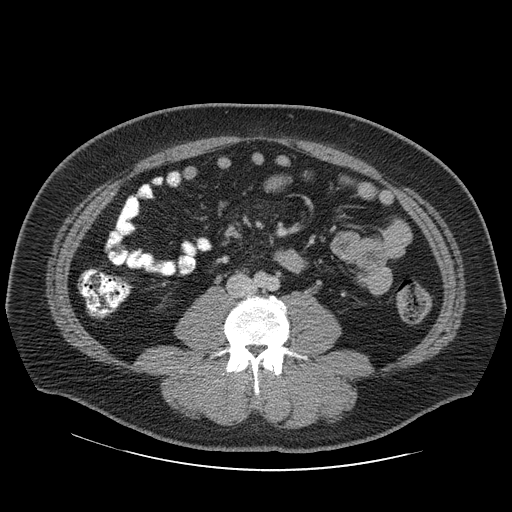
[im 58/108  soft-tissue]
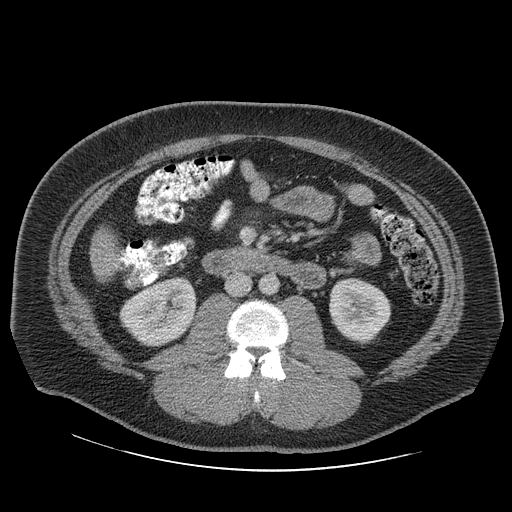
[im 63/108  soft-tissue]
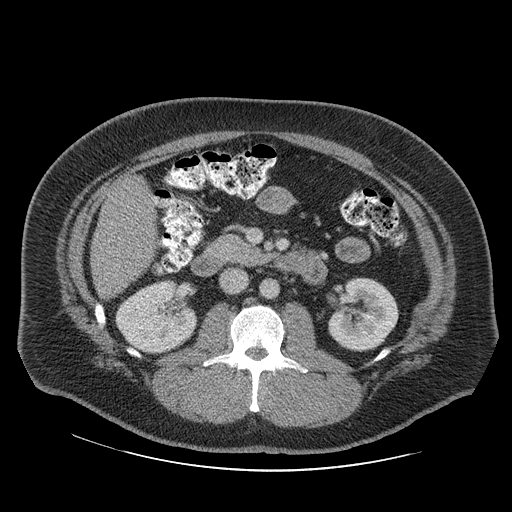
[im 63/108  bone]
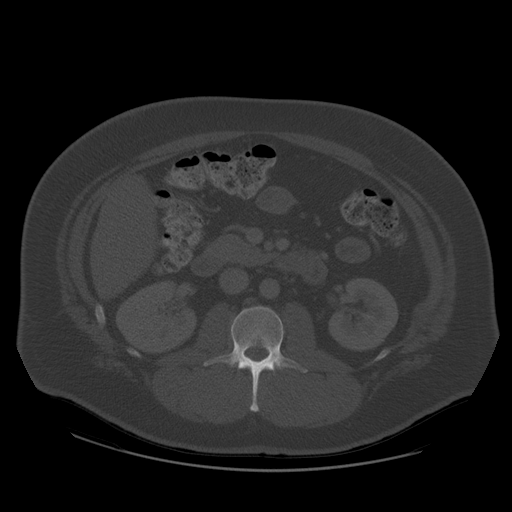
[im 72/108  soft-tissue]
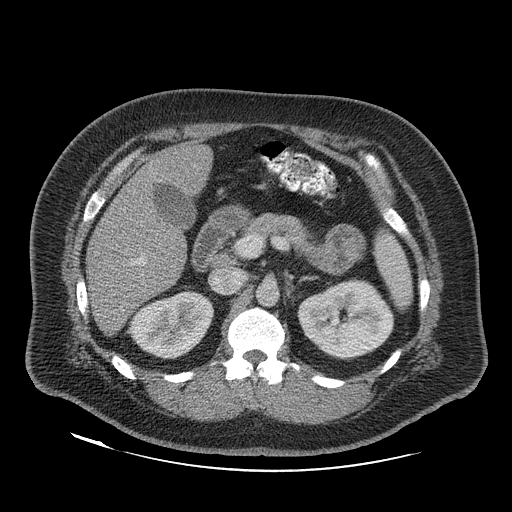
[im 81/108  soft-tissue]
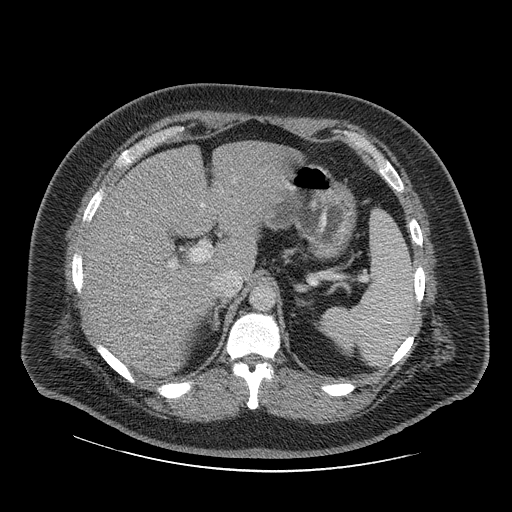
[im 85/108  soft-tissue]
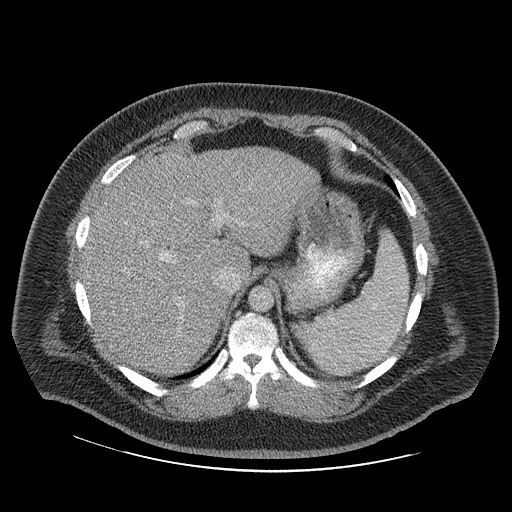
[im 94/108  soft-tissue]
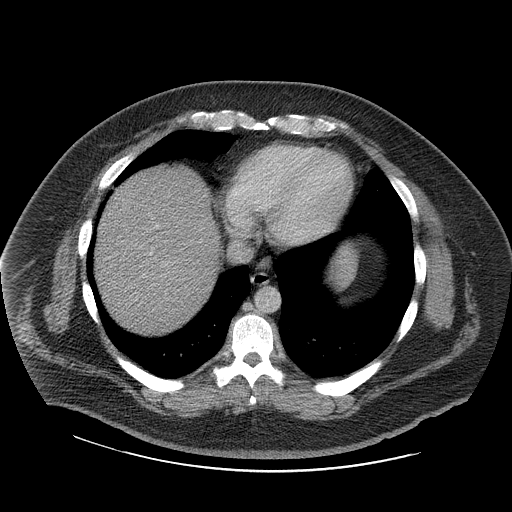
[im 103/108  soft-tissue]
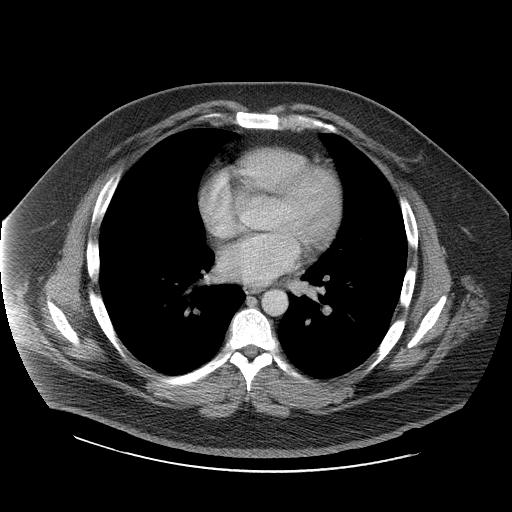

[Series 400: coronal · coronal · 1.07mm/px · 3 of 145 slices shown]
[im 49/145  soft-tissue]
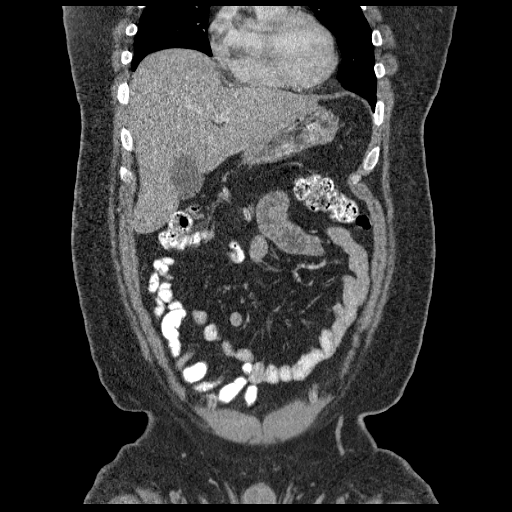
[im 65/145  soft-tissue]
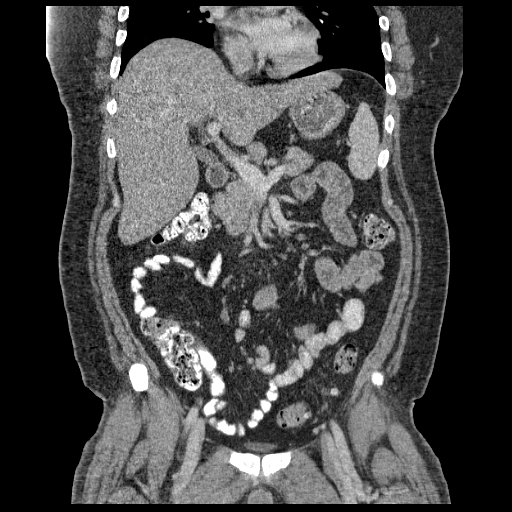
[im 81/145  soft-tissue]
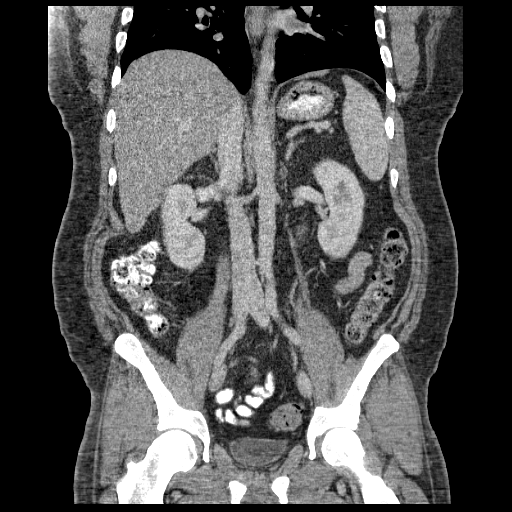

[17 of 46 positions shown; findings below may reference images not displayed]

FINDINGS: Clear lung bases.  The heart is normal in size.

Liver is of low attenuation consistent with fatty infiltration. No
liver mass or lesion seen.

Multiple gallstones. No gallbladder wall thickening or
pericholecystic inflammation. No bile duct dilation.

Normal spleen and pancreas. No adrenal masses. Normal kidneys,
ureters and the allow.

There are enlarged left was along the gastrohepatic ligament.
Reference measurement of a node anterior to the portal vein and
superior to the pancreas measures 17 mm in short axis. No other
adenopathy.

There are scattered left colon diverticula. No diverticulitis. Stool
in the colon with mild increased. Normal small bowel. Normal
appendix. Is mal hazy opacity in the fat at the root of the small
bowel mesentery. This is nonspecific. There are multiple small
mesenteric lymph nodes. None are enlarged. This is likely a chronic
finding.

No ascites. Mild degenerative changes of the lower thoracic and
lower lumbar spine. No other bony abnormality.
IMPRESSION: 1. No acute findings.
2. Mild increased stool in the colon. Scattered left colon
diverticula. No diverticulitis.
3. Hepatic steatosis.
4. Enlarged gastrohepatic ligament lymph nodes. These are likely
reactive.
5. Mild haziness in the root of the small bowel mesentery, which is
likely a chronic finding. There are associated small lymph nodes.
6. Gallstones.  No evidence of acute cholecystitis.
7. Mild degenerative changes of the visualized spine.

## 2015-01-31 ENCOUNTER — Ambulatory Visit (INDEPENDENT_AMBULATORY_CARE_PROVIDER_SITE_OTHER): Payer: BLUE CROSS/BLUE SHIELD | Admitting: Emergency Medicine

## 2015-01-31 VITALS — BP 127/79 | HR 80 | Temp 98.6°F | Resp 20 | Ht 72.0 in | Wt 282.6 lb

## 2015-01-31 DIAGNOSIS — S335XXA Sprain of ligaments of lumbar spine, initial encounter: Secondary | ICD-10-CM | POA: Diagnosis not present

## 2015-01-31 MED ORDER — ACETAMINOPHEN-CODEINE #3 300-30 MG PO TABS: 1.0000 | ORAL_TABLET | ORAL | Status: DC | PRN

## 2015-01-31 MED ORDER — CYCLOBENZAPRINE HCL 10 MG PO TABS: 10.0000 mg | ORAL_TABLET | Freq: Three times a day (TID) | ORAL | Status: DC | PRN

## 2015-01-31 MED ORDER — NAPROXEN SODIUM 550 MG PO TABS: 550.0000 mg | ORAL_TABLET | Freq: Two times a day (BID) | ORAL | Status: AC

## 2015-01-31 NOTE — Progress Notes (Signed)
[facility] [address], [place] [address] [phone]  Date:  01/31/2015   Name:  Jack ChuckRaul R Valenta   DOB:  11/08/1980   MRN:  [id number]  PCP:  No PCP Per Patient    Chief Complaint: Flank Pain   History of Present Illness:  Jack ChuckRaul R Kulak is a 34 y.o. very pleasant male patient who presents with the following:  Saturday was lifting a dresser and injured his left side.  Say he twisted and felt immediate pain in the side that radiates from the CVA region around to the side No GU or GI symptoms. No neuro symptoms Worse with twisting and bending. Improves with rest Pain has not diminished since Saturday. No improvement with over the counter medications or other home remedies.  Denies other complaint or health concern today.   Patient Active Problem List   Diagnosis Date Noted  . Type II or unspecified type diabetes mellitus without mention of complication, not stated as uncontrolled 01/20/2014    Past Medical History  Diagnosis Date  . Diabetes mellitus without complication   . GERD (gastroesophageal reflux disease)   . H/O hiatal hernia     Past Surgical History  Procedure Laterality Date  . Cholecystectomy  01/21/2014  . Cholecystectomy N/A 01/21/2014    Procedure: LAPAROSCOPIC CHOLECYSTECTOMY;  Surgeon: Atilano InaEric M Wilson, MD;  Location: [facility];  Service: General;  Laterality: N/A;    History  Substance Use Topics  . Smoking status: Former Smoker -- 0.25 packs/day for 3 years    Types: Cigarettes  . Smokeless tobacco: Not on file  . Alcohol Use: No     Comment: rarely    Family History  Problem Relation Age of Onset  . Diabetes Mother   . Heart disease Sister     No Known Allergies  Medication list has been reviewed and updated.  Current Outpatient Prescriptions on File Prior to Visit  Medication Sig Dispense Refill  . glipiZIDE (GLUCOTROL XL) 5 MG 24 hr tablet Take 1 tablet (5 mg total) by mouth daily with breakfast. 90 tablet 2   . omeprazole (PRILOSEC) 40 MG capsule Take 1 capsule (40 mg total) by mouth daily. 90 capsule 2   No current facility-administered medications on file prior to visit.    Review of Systems:  As per HPI, otherwise negative.    Physical Examination: Filed Vitals:   01/31/15 1204  BP: 127/79  Pulse: 80  Temp: 98.6 F (37 C)  Resp: 20   Filed Vitals:   01/31/15 1204  Height: 6' (1.829 m)  Weight: 282 lb 9.6 oz (128.187 kg)   Body mass index is 38.32 kg/(m^2). Ideal Body Weight: Weight in (lb) to have BMI = 25: 183.9   GEN: obese, NAD, Non-toxic, Alert & Oriented x 3 HEENT: Atraumatic, Normocephalic.  Ears and Nose: No external deformity. EXTR: No clubbing/cyanosis/edema NEURO: Normal gait.  PSYCH: Normally interactive. Conversant. Not depressed or anxious appearing.  Calm demeanor.  Back:  Left flank tenderness and spasm  Assessment and Plan: Back strain Anaprox Flexeril tyl #3  Signed,  Phillips OdorJeffery Anderson, MD

## 2015-01-31 NOTE — Patient Instructions (Signed)

## 2015-02-08 ENCOUNTER — Ambulatory Visit (INDEPENDENT_AMBULATORY_CARE_PROVIDER_SITE_OTHER): Payer: BLUE CROSS/BLUE SHIELD | Admitting: Internal Medicine

## 2015-02-08 VITALS — BP 120/80 | HR 80 | Temp 97.9°F | Resp 16 | Ht 72.0 in | Wt 278.0 lb

## 2015-02-08 DIAGNOSIS — S39012D Strain of muscle, fascia and tendon of lower back, subsequent encounter: Secondary | ICD-10-CM

## 2015-02-08 NOTE — Progress Notes (Signed)
   Subjective:    Patient ID: Docia ChuckRaul R Woodford, male    DOB: 04/14/1981, 34 y.o.   MRN: [id number]  HPI Strained back at work, still not well. No WC. Has pain with flexion.  Review of Systems     Objective:   Physical Exam  Constitutional: He is oriented to person, place, and time. He appears well-developed and well-nourished. No distress.  HENT:  Head: Normocephalic.  Eyes: EOM are normal.  Neck: Normal range of motion.  Cardiovascular: Normal rate.   Pulmonary/Chest: Effort normal.  Musculoskeletal:       Lumbar back: He exhibits tenderness, pain and spasm. He exhibits normal range of motion, no bony tenderness, no swelling, no edema, no deformity and normal pulse.  Neurological: He is alert and oriented to person, place, and time. He has normal strength and normal reflexes. He displays normal reflexes. No cranial nerve deficit or sensory deficit. Gait normal.          Assessment & Plan:  LS strain 90% well Rest 3-5 more days/Do back exercises/No lifting Do xr if not well RTC Monday am

## 2015-02-08 NOTE — Patient Instructions (Addendum)
Back Injury Prevention Back injuries can be extremely painful and difficult to heal. After having one back injury, you are much more likely to experience another later on. It is important to learn how to avoid injuring or re-injuring your back. The following tips can help you to prevent a back injury. PHYSICAL FITNESS  Exercise regularly and try to develop good tone in your abdominal muscles. Your abdominal muscles provide a lot of the support needed by your back.  Do aerobic exercises (walking, jogging, biking, swimming) regularly.  Do exercises that increase balance and strength (tai chi, yoga) regularly. This can decrease your risk of falling and injuring your back.  Stretch before and after exercising.  Maintain a healthy weight. The more you weigh, the more stress is placed on your back. For every pound of weight, 10 times that amount of pressure is placed on the back. DIET  Talk to your caregiver about how much calcium and vitamin D you need per day. These nutrients help to prevent weakening of the bones (osteoporosis). Osteoporosis can cause broken (fractured) bones that lead to back pain.  Include good sources of calcium in your diet, such as dairy products, green, leafy vegetables, and products with calcium added (fortified).  Include good sources of vitamin D in your diet, such as milk and foods that are fortified with vitamin D.  Consider taking a nutritional supplement or a multivitamin if needed.  Stop smoking if you smoke. POSTURE  Sit and stand up straight. Avoid leaning forward when you sit or hunching over when you stand.  Choose chairs with good low back (lumbar) support.  If you work at a desk, sit close to your work so you do not need to lean over. Keep your chin tucked in. Keep your neck drawn back and elbows bent at a right angle. Your arms should look like the letter "L."  Sit high and close to the steering wheel when you drive. Add a lumbar support to your car  seat if needed.  Avoid sitting or standing in one position for too long. Take breaks to get up, stretch, and walk around at least once every hour. Take breaks if you are driving for long periods of time.  Sleep on your side with your knees slightly bent, or sleep on your back with a pillow under your knees. Do not sleep on your stomach. LIFTING, TWISTING, AND REACHING  Avoid heavy lifting, especially repetitive lifting. If you must do heavy lifting:  Stretch before lifting.  Work slowly.  Rest between lifts.  Use carts and dollies to move objects when possible.  Make several small trips instead of carrying 1 heavy load.  Ask for help when you need it.  Ask for help when moving big, awkward objects.  Follow these steps when lifting:  Stand with your feet shoulder-width apart.  Get as close to the object as you can. Do not try to pick up heavy objects that are far from your body.  Use handles or lifting straps if they are available.  Bend at your knees. Squat down, but keep your heels off the floor.  Keep your shoulders pulled back, your chin tucked in, and your back straight.  Lift the object slowly, tightening the muscles in your legs, abdomen, and buttocks. Keep the object as close to the center of your body as possible.  When you put a load down, use these same guidelines in reverse.  Do not:  Lift the object above your waist.    Twist at the waist while lifting or carrying a load. Move your feet if you need to turn, not your waist.  Bend over without bending at your knees.  Avoid reaching over your head, across a table, or for an object on a high surface. OTHER TIPS  Avoid wet floors and keep sidewalks clear of ice to prevent falls.  Do not sleep on a mattress that is too soft or too hard.  Keep items that are used frequently within easy reach.  Put heavier objects on shelves at waist level and lighter objects on lower or higher shelves.  Find ways to  decrease your stress, such as exercise, massage, or relaxation techniques. Stress can build up in your muscles. Tense muscles are more vulnerable to injury.  Seek treatment for depression or anxiety if needed. These conditions can increase your risk of developing back pain. SEEK MEDICAL CARE IF:  You injure your back.  You have questions about diet, exercise, or other ways to prevent back injuries. MAKE SURE YOU:  Understand these instructions.  Will watch your condition.  Will get help right away if you are not doing well or get worse. Document Released: 10/31/2004 Document Revised: 12/16/2011 Document Reviewed: 11/04/2011 [facility] Patient Information 2015 [place], [place]. This information is not intended to replace advice given to you by your health care provider. Make sure you discuss any questions you have with your health care provider. Metformin tablets What is this medicine? METFORMIN (met FOR min) is used to treat type 2 diabetes. It helps to control blood sugar. Treatment is combined with diet and exercise. This medicine can be used alone or with other medicines for diabetes. This medicine may be used for other purposes; ask your health care provider or pharmacist if you have questions. COMMON BRAND NAME(S): Glucophage What should I tell my health care provider before I take this medicine? They need to know if you have any of these conditions: -anemia -frequently drink alcohol-containing beverages -become easily dehydrated -heart attack -heart failure that is treated with medications -kidney disease -liver disease -polycystic ovary syndrome -serious infection or injury -vomiting -an unusual or allergic reaction to metformin, other medicines, foods, dyes, or preservatives -pregnant or trying to get pregnant -breast-feeding How should I use this medicine? Take this medicine by mouth. Take it with meals. Swallow the tablets with a drink of water. Follow the directions on  the prescription label. Take your medicine at regular intervals. Do not take your medicine more often than directed. Talk to your pediatrician regarding the use of this medicine in children. While this drug may be prescribed for children as young as 21 years of age for selected conditions, precautions do apply. Overdosage: If you think you have taken too much of this medicine contact a poison control center or emergency room at once. NOTE: This medicine is only for you. Do not share this medicine with others. What if I miss a dose? If you miss a dose, take it as soon as you can. If it is almost time for your next dose, take only that dose. Do not take double or extra doses. What may interact with this medicine? Do not take this medicine with any of the following medications: -dofetilide -gatifloxacin -certain contrast medicines given before X-rays, CT scans, MRI, or other procedures This medicine may also interact with the following medications: -digoxin -diuretics -male hormones, like estrogens or progestins and birth control pills -isoniazid -medicines for blood pressure, heart disease, irregular heart beat -morphine -nicotinic acid -  phenothiazines like chlorpromazine, mesoridazine, prochlorperazine, thioridazine -phenytoin -procainamide -quinidine -quinine -ranitidine -steroid medicines like prednisone or cortisone -stimulant medicines for attention disorders, weight loss, or to stay awake -thyroid medicines -trimethoprim -vancomycin This list may not describe all possible interactions. Give your health care provider a list of all the medicines, herbs, non-prescription drugs, or dietary supplements you use. Also tell them if you smoke, drink alcohol, or use illegal drugs. Some items may interact with your medicine. What should I watch for while using this medicine? Visit your doctor or health care professional for regular checks on your progress. A test called the HbA1C (A1C)  will be monitored. This is a simple blood test. It measures your blood sugar control over the last 2 to 3 months. You will receive this test every 3 to 6 months. Learn how to check your blood sugar. Learn the symptoms of low and high blood sugar and how to manage them. Always carry a quick-source of sugar with you in case you have symptoms of low blood sugar. Examples include hard sugar candy or glucose tablets. Make sure others know that you can choke if you eat or drink when you develop serious symptoms of low blood sugar, such as seizures or unconsciousness. They must get medical help at once. Tell your doctor or health care professional if you have high blood sugar. You might need to change the dose of your medicine. If you are sick or exercising more than usual, you might need to change the dose of your medicine. Do not skip meals. Ask your doctor or health care professional if you should avoid alcohol. Many nonprescription cough and cold products contain sugar or alcohol. These can affect blood sugar. This medicine may cause ovulation in premenopausal women who do not have regular monthly periods. This may increase your chances of becoming pregnant. You should not take this medicine if you become pregnant or think you may be pregnant. Talk with your doctor or health care professional about your birth control options while taking this medicine. Contact your doctor or health care professional right away if think you are pregnant. If you are going to need surgery, a MRI, CT scan, or other procedure, tell your doctor that you are taking this medicine. You may need to stop taking this medicine before the procedure. Wear a medical ID bracelet or chain, and carry a card that describes your disease and details of your medicine and dosage times. What side effects may I notice from receiving this medicine? Side effects that you should report to your doctor or health care professional as soon as  possible: -allergic reactions like skin rash, itching or hives, swelling of the face, lips, or tongue -breathing problems -feeling faint or lightheaded, falls -muscle aches or pains -signs and symptoms of low blood sugar such as feeling anxious, confusion, dizziness, increased hunger, unusually weak or tired, sweating, shakiness, cold, irritable, headache, blurred vision, fast heartbeat, loss of consciousness -slow or irregular heartbeat -unusual stomach pain or discomfort -unusually tired or weak Side effects that usually do not require medical attention (report to your doctor or health care professional if they continue or are bothersome): -diarrhea -headache -heartburn -metallic taste in mouth -nausea -stomach gas, upset This list may not describe all possible side effects. Call your doctor for medical advice about side effects. You may report side effects to FDA at 1-800-FDA-1088. Where should I keep my medicine? Keep out of the reach of children. Store at room temperature between 15 and 30 degrees  C (59 and 86 degrees F). Protect from moisture and light. Throw away any unused medicine after the expiration date. NOTE: This sheet is a summary. It may not cover all possible information. If you have questions about this medicine, talk to your doctor, pharmacist, or health care provider.  2015, [employer]. (2013-01-05 16:03:44)

## 2015-02-13 ENCOUNTER — Ambulatory Visit (INDEPENDENT_AMBULATORY_CARE_PROVIDER_SITE_OTHER): Payer: BLUE CROSS/BLUE SHIELD | Admitting: Internal Medicine

## 2015-02-13 VITALS — BP 124/80 | HR 97 | Temp 98.0°F | Resp 18 | Ht 72.0 in | Wt 278.0 lb

## 2015-02-13 DIAGNOSIS — M545 Low back pain: Secondary | ICD-10-CM

## 2015-02-13 NOTE — Progress Notes (Signed)
   Subjective:    Patient ID: Jack Porter, male    DOB: 07/22/1981, 34 y.o.   MRN: [id number]  HPI LBP has resolved. Is doing stretches and exercises. Has a bike brace.   Review of Systems     Objective:   Physical Exam  Constitutional: He is oriented to person, place, and time. He appears well-developed and well-nourished. No distress.  HENT:  Head: Normocephalic.  Eyes: EOM are normal.  Neck: Normal range of motion.  Pulmonary/Chest: Effort normal.  Musculoskeletal:       Lumbar back: Normal.  Neurological: He is alert and oriented to person, place, and time. He has normal strength. No sensory deficit. He displays a negative Romberg sign.          Assessment & Plan:  Resolved LBP Proper lifting

## 2015-02-13 NOTE — Patient Instructions (Signed)
Place skin tag removal patient instructions here.

## 2015-03-08 ENCOUNTER — Telehealth: Payer: Self-pay | Admitting: Emergency Medicine

## 2015-03-08 NOTE — Telephone Encounter (Signed)
[facility] sent a refill request today and patient would like to change the pharmacy to [facility] [address].

## 2015-03-09 NOTE — Telephone Encounter (Signed)
Called pt, he needs office visit for any diabetes supplies. He has not been evaluated in over a year. He was here for an injury but diabetes was not discussed. Left message for pt to call back.

## 2015-07-04 ENCOUNTER — Ambulatory Visit: Payer: 59

## 2015-08-12 ENCOUNTER — Encounter (HOSPITAL_COMMUNITY): Payer: Self-pay | Admitting: Emergency Medicine

## 2015-08-12 ENCOUNTER — Emergency Department (INDEPENDENT_AMBULATORY_CARE_PROVIDER_SITE_OTHER)
Admission: EM | Admit: 2015-08-12 | Discharge: 2015-08-12 | Disposition: A | Discharge status: Home / Self Care | Payer: 59 | Source: Home / Self Care

## 2015-08-12 DIAGNOSIS — R6889 Other general symptoms and signs: Secondary | ICD-10-CM

## 2015-08-12 DIAGNOSIS — R69 Illness, unspecified: Secondary | ICD-10-CM | POA: Diagnosis not present

## 2015-08-12 LAB — POCT URINALYSIS DIP (DEVICE)
Bilirubin Urine: NEGATIVE
GLUCOSE, UA: NEGATIVE mg/dL
Hgb urine dipstick: NEGATIVE
Ketones, ur: NEGATIVE mg/dL
LEUKOCYTES UA: NEGATIVE
NITRITE: NEGATIVE
Protein, ur: NEGATIVE mg/dL
Specific Gravity, Urine: 1.025 (ref 1.005–1.030)
UROBILINOGEN UA: 4 mg/dL — AB (ref 0.0–1.0)
pH: 6 (ref 5.0–8.0)

## 2015-08-12 LAB — POCT I-STAT, CHEM 8
BUN: 11 mg/dL (ref 6–20)
CALCIUM ION: 1.2 mmol/L (ref 1.12–1.23)
CHLORIDE: 102 mmol/L (ref 101–111)
Creatinine, Ser: 0.8 mg/dL (ref 0.61–1.24)
Glucose, Bld: 129 mg/dL — ABNORMAL HIGH (ref 65–99)
HCT: 55 % — ABNORMAL HIGH (ref 39.0–52.0)
Hemoglobin: 18.7 g/dL — ABNORMAL HIGH (ref 13.0–17.0)
Potassium: 4.4 mmol/L (ref 3.5–5.1)
Sodium: 139 mmol/L (ref 135–145)
TCO2: 26 mmol/L (ref 0–100)

## 2015-08-12 NOTE — ED Provider Notes (Signed)
CSN: [id number]     Arrival date & time 08/12/15  1648 History   None    No chief complaint on file.  (Consider location/radiation/quality/duration/timing/severity/associated sxs/prior Treatment) HPI History obtained from patient: Pt presents today states he is not feeling well. Has not had his diabetic medication in over 1 year. States he is arguing more with his girl friend, feels depressed, and thinks his lack of diabetic management is the cause.   Has not checked his blood sugar, no recent HBA1c.  Pt states he has lost 65 pounds.   Past Medical History  Diagnosis Date  . Diabetes mellitus without complication   . GERD (gastroesophageal reflux disease)   . H/O hiatal hernia    Past Surgical History  Procedure Laterality Date  . Cholecystectomy  01/21/2014  . Cholecystectomy N/A 01/21/2014    Procedure: LAPAROSCOPIC CHOLECYSTECTOMY;  Surgeon: Atilano InaEric M Wilson, MD;  Location: [facility];  Service: General;  Laterality: N/A;   Family History  Problem Relation Age of Onset  . Diabetes Mother   . Heart disease Sister    Social History  Substance Use Topics  . Smoking status: Former Smoker -- 0.25 packs/day for 3 years    Types: Cigarettes  . Smokeless tobacco: Not on file  . Alcohol Use: No     Comment: rarely    Review of Systems ROS +'ve Not feeling well  Denies: HEADACHE, NAUSEA, ABDOMINAL PAIN, CHEST PAIN, CONGESTION, DYSURIA, SHORTNESS OF BREATH  Allergies  Review of patient's allergies indicates no known allergies.  Home Medications   Prior to Admission medications   Medication Sig Start Date End Date Taking? Authorizing Provider  acetaminophen-codeine (TYLENOL #3) 300-30 MG per tablet Take 1-2 tablets by mouth every 4 (four) hours as needed. 01/31/15   Carmelina DaneJeffery S Anderson, MD  cyclobenzaprine (FLEXERIL) 10 MG tablet Take 1 tablet (10 mg total) by mouth 3 (three) times daily as needed for muscle spasms. 01/31/15   Carmelina DaneJeffery S Anderson, MD  glipiZIDE (GLUCOTROL XL) 5 MG 24 hr  tablet Take 1 tablet (5 mg total) by mouth daily with breakfast. 06/22/14   Collene GobbleSteven A Daub, MD  naproxen sodium (ANAPROX DS) 550 MG tablet Take 1 tablet (550 mg total) by mouth 2 (two) times daily with a meal. 01/31/15 01/31/16  Carmelina DaneJeffery S Anderson, MD  omeprazole (PRILOSEC) 40 MG capsule Take 1 capsule (40 mg total) by mouth daily. 06/22/14   Collene GobbleSteven A Daub, MD   Meds Ordered and Administered this Visit  Medications - No data to display  BP 126/82 mmHg  Pulse 90  Temp(Src) 99.5 F (37.5 C) (Oral)  Resp 16  SpO2 99% No data found.   Physical Exam NURSES NOTES AND VITAL SIGNS REVIEWED. CONSTITUTIONAL: Well developed, well nourished, no acute distress HEENT: normocephalic, atraumatic EYES: Conjunctiva normal NECK:normal ROM, supple PULMONARY:No respiratory distress, normal effort, Lungs: CTAb/l CARDIOVASCULAR: RRR, no murmur ABDOMEN: soft, ND, NT, +'ve BS MUSCULOSKELETAL: Normal ROM of all extremities SKIN: warm and dry without rash PSYCHIATRIC: Mood and affect normal  ED Course  Procedures (including critical care time)  Labs Review Labs Reviewed  POCT I-STAT, CHEM 8 - Abnormal; Notable for the following:    Glucose, Bld 129 (*)    Hemoglobin 18.7 (*)    HCT 55.0 (*)    All other components within normal limits    Imaging Review No results found.   Visual Acuity Review  Right Eye Distance:   Left Eye Distance:   Bilateral Distance:    Right  Eye Near:   Left Eye Near:    Bilateral Near:         MDM   1. Feeling unwell    Treatment options discussed including: checking bloods, starting medication. PCP referral. Investigations: CBC, basic chem, UA   Pt is stable for discharge at this time. No medication given at this time.  Continue symptomatic treatment at home. Advised to follow up with PCP or return to the clinic if there are new or worsening of symptoms.  All questions answered.  Instructions of care provided.       Tharon Aquas, PA 08/12/15  [phone]

## 2015-08-12 NOTE — Discharge Instructions (Signed)
Diabetes and Exercise Your random tested blood sugar today is 129. You will need to have a Hb A1C performed at your follow up appointment Exercising regularly is important. It is not just about losing weight. It has many health benefits, such as:  Improving your overall fitness, flexibility, and endurance.  Increasing your bone density.  Helping with weight control.  Decreasing your body fat.  Increasing your muscle strength.  Reducing stress and tension.  Improving your overall health. People with diabetes who exercise gain additional benefits because exercise:  Reduces appetite.  Improves the body's use of blood sugar (glucose).  Helps lower or control blood glucose.  Decreases blood pressure.  Helps control blood lipids (such as cholesterol and triglycerides).  Improves the body's use of the hormone insulin by:  Increasing the body's insulin sensitivity.  Reducing the body's insulin needs.  Decreases the risk for heart disease because exercising:  Lowers cholesterol and triglycerides levels.  Increases the levels of good cholesterol (such as high-density lipoproteins [HDL]) in the body.  Lowers blood glucose levels. YOUR ACTIVITY PLAN  Choose an activity that you enjoy, and set realistic goals. To exercise safely, you should begin practicing any new physical activity slowly, and gradually increase the intensity of the exercise over time. Your health care provider or diabetes educator can help create an activity plan that works for you. General recommendations include:  Encouraging children to engage in at least 60 minutes of physical activity each day.  Stretching and performing strength training exercises, such as yoga or weight lifting, at least 2 times per week.  Performing a total of at least 150 minutes of moderate-intensity exercise each week, such as brisk walking or water aerobics.  Exercising at least 3 days per week, making sure you allow no more than 2  consecutive days to pass without exercising.  Avoiding long periods of inactivity (90 minutes or more). When you have to spend an extended period of time sitting down, take frequent breaks to walk or stretch. RECOMMENDATIONS FOR EXERCISING WITH TYPE 1 OR TYPE 2 DIABETES   Check your blood glucose before exercising. If blood glucose levels are greater than 240 mg/dL, check for urine ketones. Do not exercise if ketones are present.  Avoid injecting insulin into areas of the body that are going to be exercised. For example, avoid injecting insulin into:  The arms when playing tennis.  The legs when jogging.  Keep a record of:  Food intake before and after you exercise.  Expected peak times of insulin action.  Blood glucose levels before and after you exercise.  The type and amount of exercise you have done.  Review your records with your health care provider. Your health care provider will help you to develop guidelines for adjusting food intake and insulin amounts before and after exercising.  If you take insulin or oral hypoglycemic agents, watch for signs and symptoms of hypoglycemia. They include:  Dizziness.  Shaking.  Sweating.  Chills.  Confusion.  Drink plenty of water while you exercise to prevent dehydration or heat stroke. Body water is lost during exercise and must be replaced.  Talk to your health care provider before starting an exercise program to make sure it is safe for you. Remember, almost any type of activity is better than none.   This information is not intended to replace advice given to you by your health care provider. Make sure you discuss any questions you have with your health care provider.   Document Released:  12/14/2003 Document Revised: 02/07/2015 Document Reviewed: 03/02/2013 Elsevier Interactive Patient Education [employer] Inc.

## 2015-08-14 LAB — HEMOGLOBIN A1C
Hgb A1c MFr Bld: 5.7 % — ABNORMAL HIGH (ref 4.8–5.6)
MEAN PLASMA GLUCOSE: 117 mg/dL

## 2015-08-14 NOTE — ED Notes (Signed)
Final report of HA1C available for review

## 2017-08-04 DIAGNOSIS — H52223 Regular astigmatism, bilateral: Secondary | ICD-10-CM | POA: Diagnosis not present

## 2017-11-04 ENCOUNTER — Encounter: Payer: Self-pay | Admitting: Family Medicine

## 2017-11-04 ENCOUNTER — Ambulatory Visit: Payer: BLUE CROSS/BLUE SHIELD | Admitting: Family Medicine

## 2017-11-04 VITALS — BP 131/91 | HR 103 | Temp 98.2°F | Ht 74.5 in | Wt 301.6 lb

## 2017-11-04 DIAGNOSIS — R748 Abnormal levels of other serum enzymes: Secondary | ICD-10-CM

## 2017-11-04 DIAGNOSIS — E118 Type 2 diabetes mellitus with unspecified complications: Secondary | ICD-10-CM | POA: Diagnosis not present

## 2017-11-04 DIAGNOSIS — K429 Umbilical hernia without obstruction or gangrene: Secondary | ICD-10-CM | POA: Diagnosis not present

## 2017-11-04 DIAGNOSIS — R0683 Snoring: Secondary | ICD-10-CM | POA: Diagnosis not present

## 2017-11-04 LAB — BAYER DCA HB A1C WAIVED: HB A1C (BAYER DCA - WAIVED): 9.4 % — ABNORMAL HIGH

## 2017-11-04 MED ORDER — SITAGLIPTIN PHOS-METFORMIN HCL 50-1000 MG PO TABS: 1.0000 | ORAL_TABLET | Freq: Two times a day (BID) | ORAL | 3 refills | Status: DC

## 2017-11-04 NOTE — Progress Notes (Signed)
   HPI  Patient presents today here to establish care.  Patient was previously seen for diabetes a few years ago, he gained good control with diet exercise and stopped glipizide.  He tolerated this well.  He is having symptoms similar to diabetes again. He notes polyuria and tingling fingers.  He understand his dietary indescretion  He is also concerned he has an umbilical hernia with bulging of the umbilicus for a few months.  PMH: DM2, GERD,  Surgical Hx- s/p cholecystectomy Social Hx: Former smoker ROS: Per HPI  Objective: BP (!) 131/91   Pulse (!) 103   Temp 98.2 F (36.8 C) (Oral)   Ht 6' 2.5" (1.892 m)   Wt (!) 301 lb 9.6 oz (136.8 kg)   BMI 38.21 kg/m  Gen: NAD, alert, cooperative with exam HEENT: NCAT, nares clear, oropharynx moist and clear CV: RRR, good S1/S2, no murmur Resp: CTABL, no wheezes, non-labored Abd: umbilical wall defect midline at the umbilicus Ext: No edema, warm Neuro: Alert and oriented, No gross deficits  Assessment and plan:  #Type 2 diabetes Uncontrolled, A1c 9.4. Starting Janumet Refer to nutrition Return to clinic in 3 months  #Snoring Refer to neurology for consideration of sleep study Likely has OSA  #Umbilical hernia Refer to general surgery Mildly symptomatic, defect palpated today without any bowel present     Orders Placed This Encounter  Procedures  . Bayer DCA Hb A1c Waived  . CBC with Differential/Platelet  . CMP14+EGFR  . Lipid panel  . TSH  . Ambulatory referral to General Surgery    Referral Priority:   Routine    Referral Type:   Surgical    Referral Reason:   Specialty Services Required    Referred to Provider:   Donnie Mesa, MD    Requested Specialty:   General Surgery    Number of Visits Requested:   1  . Ambulatory referral to Neurology    Referral Priority:   Routine    Referral Type:   Consultation    Referral Reason:   Specialty Services Required    Requested Specialty:   Neurology   Number of Visits Requested:   1  . Ambulatory referral to diabetic education    Referral Priority:   Routine    Referral Type:   Consultation    Referral Reason:   Specialty Services Required    Number of Visits Requested:   1    Meds ordered this encounter  Medications  . sitaGLIPtin-metformin (JANUMET) 50-1000 MG tablet    Sig: Take 1 tablet by mouth 2 (two) times daily with a meal.    Dispense:  60 tablet    Refill:  [place], MD [place] 11/04/2017, 2:02 PM

## 2017-11-05 LAB — CBC WITH DIFFERENTIAL/PLATELET
BASOS ABS: 0 10*3/uL (ref 0.0–0.2)
BASOS: 1 %
EOS (ABSOLUTE): 0.2 10*3/uL (ref 0.0–0.4)
Eos: 3 %
Hematocrit: 47.6 % (ref 37.5–51.0)
Hemoglobin: 16.6 g/dL (ref 13.0–17.7)
IMMATURE GRANS (ABS): 0 10*3/uL (ref 0.0–0.1)
Immature Granulocytes: 1 %
LYMPHS: 46 %
Lymphocytes Absolute: 3.1 10*3/uL (ref 0.7–3.1)
MCH: 29.7 pg (ref 26.6–33.0)
MCHC: 34.9 g/dL (ref 31.5–35.7)
MCV: 85 fL (ref 79–97)
MONOS ABS: 0.3 10*3/uL (ref 0.1–0.9)
Monocytes: 4 %
NEUTROS ABS: 2.9 10*3/uL (ref 1.4–7.0)
NEUTROS PCT: 45 %
PLATELETS: 193 10*3/uL (ref 150–379)
RBC: 5.59 x10E6/uL (ref 4.14–5.80)
RDW: 14.3 % (ref 12.3–15.4)
WBC: 6.4 10*3/uL (ref 3.4–10.8)

## 2017-11-05 LAB — CMP14+EGFR
A/G RATIO: 1.4 (ref 1.2–2.2)
ALK PHOS: 125 IU/L — AB (ref 39–117)
ALT: 64 IU/L — AB (ref 0–44)
AST: 50 IU/L — AB (ref 0–40)
Albumin: 4.5 g/dL (ref 3.5–5.5)
BILIRUBIN TOTAL: 1.4 mg/dL — AB (ref 0.0–1.2)
BUN/Creatinine Ratio: 14 (ref 9–20)
BUN: 10 mg/dL (ref 6–20)
CHLORIDE: 101 mmol/L (ref 96–106)
CO2: 21 mmol/L (ref 20–29)
Calcium: 9.4 mg/dL (ref 8.7–10.2)
Creatinine, Ser: 0.71 mg/dL — ABNORMAL LOW (ref 0.76–1.27)
GFR calc Af Amer: 139 mL/min/{1.73_m2} (ref >59)
GFR calc non Af Amer: 120 mL/min/{1.73_m2} (ref >59)
GLUCOSE: 204 mg/dL — AB (ref 65–99)
Globulin, Total: 3.3 g/dL (ref 1.5–4.5)
POTASSIUM: 4.2 mmol/L (ref 3.5–5.2)
Sodium: 140 mmol/L (ref 134–144)
Total Protein: 7.8 g/dL (ref 6.0–8.5)

## 2017-11-05 LAB — TSH: TSH: 1.93 u[IU]/mL (ref 0.450–4.500)

## 2017-11-05 LAB — LIPID PANEL
CHOLESTEROL TOTAL: 202 mg/dL — AB (ref 100–199)
Chol/HDL Ratio: 7 ratio — ABNORMAL HIGH (ref 0.0–5.0)
HDL: 29 mg/dL — AB (ref >39)
LDL Calculated: 137 mg/dL — ABNORMAL HIGH (ref 0–99)
TRIGLYCERIDES: 182 mg/dL — AB (ref 0–149)
VLDL CHOLESTEROL CAL: 36 mg/dL (ref 5–40)

## 2017-11-06 NOTE — Addendum Note (Signed)
Addended byDory Peru: Ashe Gago C on: 11/06/2017 03:09 PM   Modules accepted: Orders

## 2017-11-25 ENCOUNTER — Ambulatory Visit: Payer: Self-pay | Admitting: Surgery

## 2018-02-03 ENCOUNTER — Ambulatory Visit: Payer: BLUE CROSS/BLUE SHIELD | Admitting: Family Medicine

## 2018-02-03 ENCOUNTER — Encounter: Payer: Self-pay | Admitting: Family Medicine

## 2018-02-03 VITALS — BP 127/86 | HR 104 | Temp 97.0°F | Ht 74.5 in | Wt 303.0 lb

## 2018-02-03 DIAGNOSIS — K429 Umbilical hernia without obstruction or gangrene: Secondary | ICD-10-CM | POA: Diagnosis not present

## 2018-02-03 DIAGNOSIS — E118 Type 2 diabetes mellitus with unspecified complications: Secondary | ICD-10-CM | POA: Diagnosis not present

## 2018-02-03 DIAGNOSIS — E878 Other disorders of electrolyte and fluid balance, not elsewhere classified: Secondary | ICD-10-CM | POA: Diagnosis not present

## 2018-02-03 LAB — BAYER DCA HB A1C WAIVED: HB A1C: 11.1 % — AB (ref <7.0)

## 2018-02-03 MED ORDER — GLIPIZIDE 5 MG PO TABS: 5.0000 mg | ORAL_TABLET | Freq: Two times a day (BID) | ORAL | 3 refills | Status: DC

## 2018-02-03 MED ORDER — METFORMIN HCL 500 MG PO TABS: 500.0000 mg | ORAL_TABLET | Freq: Two times a day (BID) | ORAL | 3 refills | Status: DC

## 2018-02-03 NOTE — Patient Instructions (Signed)
Great to see you!  Start metformin 1 pill twice daily as well as glipizide.   If anything is not tolerated or not affordable please let me know

## 2018-02-03 NOTE — Progress Notes (Signed)
   HPI  Patient presents today for follow-up diabetes.  Patient states that he is been watching his diet moderately.  However he has had a very stressful time with a custody battle. He did not start Janumet due to cost He has been working out 4 times a week using creatine, and also other supplements.  Patient states that his umbilical hernia is beginning to bother him at times. Would like to see a surgeon to address it  PMH: Smoking status noted ROS: Per HPI  Objective: BP 127/86   Pulse (!) 104   Temp (!) 97 F (36.1 C) (Oral)   Ht 6' 2.5" (1.892 m)   Wt (!) 303 lb (137.4 kg)   BMI 38.38 kg/m  Gen: NAD, alert, cooperative with exam HEENT: NCAT CV: RRR, good S1/S2, no murmur Resp: CTABL, no wheezes, non-labored Abd: Soft, nontender umbilical hernia Ext: No edema, warm Neuro: Alert and oriented, No gross deficits  Assessment and plan:  #Type 2 diabetes Uncontrolled, A1c worsened to 11.1 Patient could not tolerate Janumet, will start metformin 500 twice daily plus glipizide 5 mg twice daily Discussed call back if not affordable or not tolerated  #Umbilical hernia Refer to surgery, I would recommend better diabetic control before surgery     Orders Placed This Encounter  Procedures  . Microalbumin / creatinine urine ratio  . Bayer [facility] Hb A1c Ellender Hose, MD [facility] 02/03/2018, 2:08 PM

## 2018-02-04 LAB — CMP14+EGFR
ALT: 71 IU/L — ABNORMAL HIGH (ref 0–44)
AST: 64 IU/L — ABNORMAL HIGH (ref 0–40)
Albumin/Globulin Ratio: 1.2 (ref 1.2–2.2)
Albumin: 4 g/dL (ref 3.5–5.5)
Alkaline Phosphatase: 139 IU/L — ABNORMAL HIGH (ref 39–117)
BUN/Creatinine Ratio: 16 (ref 9–20)
BUN: 10 mg/dL (ref 6–20)
Bilirubin Total: 0.6 mg/dL (ref 0.0–1.2)
CO2: 19 mmol/L — ABNORMAL LOW (ref 20–29)
Calcium: 9.2 mg/dL (ref 8.7–10.2)
Chloride: 101 mmol/L (ref 96–106)
Creatinine, Ser: 0.61 mg/dL — ABNORMAL LOW (ref 0.76–1.27)
GFR calc Af Amer: 147 mL/min/{1.73_m2} (ref >59)
GFR calc non Af Amer: 128 mL/min/{1.73_m2} (ref >59)
Globulin, Total: 3.4 g/dL (ref 1.5–4.5)
Glucose: 406 mg/dL (ref 65–99)
Potassium: 4.6 mmol/L (ref 3.5–5.2)
Sodium: 137 mmol/L (ref 134–144)
Total Protein: 7.4 g/dL (ref 6.0–8.5)

## 2018-02-05 ENCOUNTER — Other Ambulatory Visit: Payer: BLUE CROSS/BLUE SHIELD

## 2018-02-05 DIAGNOSIS — R748 Abnormal levels of other serum enzymes: Secondary | ICD-10-CM

## 2018-02-05 NOTE — Addendum Note (Signed)
Addended by: Lorelee Cover C on: 02/05/2018 11:47 AM   Modules accepted: Orders

## 2018-02-06 LAB — CMP14+EGFR
A/G RATIO: 1.2 (ref 1.2–2.2)
ALT: 75 IU/L — AB (ref 0–44)
AST: 72 IU/L — AB (ref 0–40)
Albumin: 4.1 g/dL (ref 3.5–5.5)
Alkaline Phosphatase: 131 IU/L — ABNORMAL HIGH (ref 39–117)
BILIRUBIN TOTAL: 0.8 mg/dL (ref 0.0–1.2)
BUN/Creatinine Ratio: 15 (ref 9–20)
BUN: 11 mg/dL (ref 6–20)
CALCIUM: 9.4 mg/dL (ref 8.7–10.2)
CO2: 24 mmol/L (ref 20–29)
Chloride: 101 mmol/L (ref 96–106)
Creatinine, Ser: 0.73 mg/dL — ABNORMAL LOW (ref 0.76–1.27)
GFR, EST AFRICAN AMERICAN: 137 mL/min/{1.73_m2} (ref >59)
GFR, EST NON AFRICAN AMERICAN: 118 mL/min/{1.73_m2} (ref >59)
GLOBULIN, TOTAL: 3.3 g/dL (ref 1.5–4.5)
Glucose: 363 mg/dL — ABNORMAL HIGH (ref 65–99)
POTASSIUM: 4.5 mmol/L (ref 3.5–5.2)
SODIUM: 138 mmol/L (ref 134–144)
TOTAL PROTEIN: 7.4 g/dL (ref 6.0–8.5)

## 2018-02-23 ENCOUNTER — Ambulatory Visit: Payer: Self-pay | Admitting: Surgery

## 2018-02-23 DIAGNOSIS — K429 Umbilical hernia without obstruction or gangrene: Secondary | ICD-10-CM | POA: Diagnosis not present

## 2018-02-23 NOTE — H&P (Signed)
History of Present Illness Jack Porter. [name] MD; 02/23/2018 10:37 AM) The patient is a 37 year old male who presents with an umbilical hernia. Ref: Murtis Sink Umbilical hernia  This is a 37 year old male who is obese and has had a previous labs, cholecystectomy who presents with a one-year history of an enlarging umbilical hernia. 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 The bulge in his umbilicus has become larger and is more uncomfortable. He denies any GI obstructive symptoms. He is on medication for type 2 diabetes. His weight has gone up proximally 30 pounds since his cholecystectomy in 2015. He presents now for evaluation for umbilical hernia repair. No imaging.   Past Surgical History Sander Nephew, CMA; 02/23/2018 10:17 AM) Gallbladder Surgery - Open  Diagnostic Studies History Sander Nephew, CMA; 02/23/2018 10:17 AM) Colonoscopy 1-5 years ago  Allergies Duwayne Heck Gerrigner, CMA; 02/23/2018 10:17 AM) No Known Allergies [02/23/2018]: Allergies Reconciled  Medication History Sander Nephew, CMA; 02/23/2018 10:17 AM) GlipiZIDE (  Tablet, Oral) Active. MetFORMIN HCl (  Tablet, Oral) Active. Medications Reconciled  Social History Duwayne Heck [employer] fast stream[employer], CMA; 02/23/2018 10:17 AM) Alcohol use Remotely quit alcohol use. Caffeine use Carbonated beverages, Tea. Illicit drug use Remotely quit drug use. Tobacco use Former smoker.  Family History Sander Nephew, CMA; 02/23/2018 10:17 AM) Alcohol Abuse Brother, Mother. Colon Cancer Father. Diabetes Mellitus Mother, Sister. Hypertension Brother. Migraine Headache Brother.  Other Problems Sander Nephew, CMA; 02/23/2018 10:17 AM) Back Pain Diabetes Mellitus     Review of Systems Duwayne Heck Gerrigner CMA; 02/23/2018 10:17 AM) General Present- Fatigue  and Night Sweats. Not Present- Appetite Loss, Chills, Fever, Weight Gain and Weight Loss. Skin Not Present- Change in Wart/Mole, Dryness, Hives, Jaundice, New Lesions, Non-Healing Wounds, Rash and Ulcer. HEENT Not Present- Earache, Hearing Loss, Hoarseness, Nose Bleed, Oral Ulcers, Ringing in the Ears, Seasonal Allergies, Sinus Pain, Sore Throat, Visual Disturbances, Wears glasses/contact lenses and Yellow Eyes. Respiratory Present- Snoring. Not Present- Bloody sputum, Chronic Cough, Difficulty Breathing and Wheezing. Breast Not Present- Breast Mass, Breast Pain, Nipple Discharge and Skin Changes. Cardiovascular Not Present- Chest Pain, Difficulty Breathing Lying Down, Leg Cramps, Palpitations, Rapid Heart Rate, Shortness of Breath and Swelling of Extremities. Gastrointestinal Not Present- Abdominal Pain, Bloating, Bloody Stool, Change in Bowel Habits, Chronic diarrhea, Constipation, Difficulty Swallowing, Excessive gas, Gets full quickly at meals, Hemorrhoids, Indigestion, Nausea, Rectal Pain and Vomiting. Male Genitourinary Not Present- Blood in Urine, Change in Urinary Stream, Frequency, Impotence, Nocturia, Painful Urination, Urgency and Urine Leakage. Musculoskeletal Not Present- Back Pain, Joint Pain, Joint Stiffness, Muscle Pain, Muscle Weakness and Swelling of Extremities. Neurological Not Present- Decreased Memory, Fainting, Headaches, Numbness, Seizures, Tingling, Tremor, Trouble walking and Weakness. Psychiatric Not Present- Anxiety, Bipolar, Change in Sleep Pattern, Depression, Fearful and Frequent crying. Endocrine Not Present- Cold Intolerance, Excessive Hunger, Hair Changes, Heat Intolerance, Hot flashes and New Diabetes. Hematology Not Present- Blood Thinners, Easy Bruising, Excessive bleeding, Gland problems, HIV and Persistent Infections.  Vitals Duwayne Heck Gerrigner CMA; 02/23/2018 10:18 AM) 02/23/2018 10:17 AM Weight: 303.38 lb Height: 74in Body Surface Area: 2.59 m Body Mass  Index: 38.95 kg/m  Temp.: 69F(Oral)  Pulse: 120 (Regular)  BP: 130/90 (Sitting, Left Arm, Standard)      Physical Exam Molli Hazard K. Malin Sambrano MD; 02/23/2018 10:39 AM)  The physical exam findings are as follows: Note:WDWN in NAD Eyes: Pupils equal, round; sclera anicteric HENT: Oral mucosa  moist; good dentition Neck: No masses palpated, no thyromegaly Lungs: CTA bilaterally; normal respiratory effort CV: Regular rate and rhythm; no murmurs; extremities well-perfused with no edema Abd: +bowel sounds, soft, non-tender, no palpable organomegaly; visible supraumbilical hernia - bulge protrudes bilaterally; partially reducible; no skin changes; healed incision Skin: Warm, dry; no sign of jaundice Psychiatric - alert and oriented x 4; calm mood and affect    Assessment & Plan Molli Hazard K. Whitni Pasquini MD; 02/23/2018 10:34 AM)  UMBILICAL HERNIA WITHOUT OBSTRUCTION OR GANGRENE (K42.9)  Current Plans Schedule for Surgery - umbilical hernia repair with mesh. The surgical procedure has been discussed with the patient. Potential risks, benefits, alternative treatments, and expected outcomes have been explained. All of the patient's questions at this time have been answered. The likelihood of reaching the patient's treatment goal is good. The patient understand the proposed surgical procedure and wishes to proceed.  Jack Porter. Corliss Skains, MD, [facility] Surgery  General/ Trauma Surgery  02/23/2018 10:39 AM

## 2018-02-27 ENCOUNTER — Other Ambulatory Visit: Payer: Self-pay

## 2018-02-27 MED ORDER — BLOOD GLUCOSE MONITOR KIT: PACK | 0 refills | Status: AC

## 2018-11-02 ENCOUNTER — Encounter: Payer: Self-pay | Admitting: Family Medicine

## 2018-11-02 ENCOUNTER — Ambulatory Visit (INDEPENDENT_AMBULATORY_CARE_PROVIDER_SITE_OTHER): Payer: Self-pay | Admitting: Family Medicine

## 2018-11-02 VITALS — BP 126/94 | HR 115 | Temp 97.7°F | Ht 74.0 in | Wt 290.0 lb

## 2018-11-02 DIAGNOSIS — E781 Pure hyperglyceridemia: Secondary | ICD-10-CM

## 2018-11-02 DIAGNOSIS — E669 Obesity, unspecified: Secondary | ICD-10-CM | POA: Insufficient documentation

## 2018-11-02 DIAGNOSIS — Z6837 Body mass index (BMI) 37.0-37.9, adult: Secondary | ICD-10-CM

## 2018-11-02 DIAGNOSIS — E1169 Type 2 diabetes mellitus with other specified complication: Secondary | ICD-10-CM

## 2018-11-02 DIAGNOSIS — E119 Type 2 diabetes mellitus without complications: Secondary | ICD-10-CM

## 2018-11-02 DIAGNOSIS — M722 Plantar fascial fibromatosis: Secondary | ICD-10-CM

## 2018-11-02 LAB — BAYER DCA HB A1C WAIVED: HB A1C: 9.3 % — AB (ref <7.0)

## 2018-11-02 MED ORDER — METFORMIN HCL 500 MG PO TABS: 500.0000 mg | ORAL_TABLET | Freq: Two times a day (BID) | ORAL | 3 refills | Status: DC

## 2018-11-02 MED ORDER — GLIPIZIDE 5 MG PO TABS: 5.0000 mg | ORAL_TABLET | Freq: Two times a day (BID) | ORAL | 3 refills | Status: DC

## 2018-11-02 MED ORDER — NAPROXEN 500 MG PO TABS: 500.0000 mg | ORAL_TABLET | Freq: Two times a day (BID) | ORAL | 0 refills | Status: AC

## 2018-11-02 NOTE — Patient Instructions (Signed)
Plantar Fasciitis  Plantar fasciitis is a painful foot condition that affects the heel. It occurs when the band of tissue that connects the toes to the heel bone (plantar fascia) becomes irritated. This can happen as the result of exercising too much or doing other repetitive activities (overuse injury). The pain from plantar fasciitis can range from mild irritation to severe pain that makes it difficult to walk or move. The pain is usually worse in the morning after sleeping, or after sitting or lying down for a while. Pain may also be worse after long periods of walking or standing. What are the causes? This condition may be caused by:  Standing for long periods of time.  Wearing shoes that do not have good arch support.  Doing activities that put stress on joints (high-impact activities), including running, aerobics, and ballet.  Being overweight.  An abnormal way of walking (gait).  Tight muscles in the back of your lower leg (calf).  High arches in your feet.  Starting a new athletic activity. What are the signs or symptoms? The main symptom of this condition is heel pain. Pain may:  Be worse with first steps after a time of rest, especially in the morning after sleeping or after you have been sitting or lying down for a while.  Be worse after long periods of standing still.  Decrease after 30-45 minutes of activity, such as gentle walking. How is this diagnosed? This condition may be diagnosed based on your medical history and your symptoms. Your health care provider may ask questions about your activity level. Your health care provider will do a physical exam to check for:  A tender area on the bottom of your foot.  A high arch in your foot.  Pain when you move your foot.  Difficulty moving your foot. You may have imaging tests to confirm the diagnosis, such as:  X-rays.  Ultrasound.  MRI. How is this treated? Treatment for plantar fasciitis depends on how  severe your condition is. Treatment may include:  Rest, ice, applying pressure (compression), and raising the affected foot (elevation). This may be called RICE therapy. Your health care provider may recommend RICE therapy along with over-the-counter pain medicines to manage your pain.  Exercises to stretch your calves and your plantar fascia.  A splint that holds your foot in a stretched, upward position while you sleep (night splint).  Physical therapy to relieve symptoms and prevent problems in the future.  Injections of steroid medicine (cortisone) to relieve pain and inflammation.  Stimulating your plantar fascia with electrical impulses (extracorporeal shock wave therapy). This is usually the last treatment option before surgery.  Surgery, if other treatments have not worked after 12 months. Follow these instructions at home:  Managing pain, stiffness, and swelling  If directed, put ice on the painful area: ? Put ice in a plastic bag, or use a frozen bottle of water. ? Place a towel between your skin and the bag or bottle. ? Roll the bottom of your foot over the bag or bottle. ? Do this for 20 minutes, 2-3 times a day.  Wear athletic shoes that have air-sole or gel-sole cushions, or try wearing soft shoe inserts that are designed for plantar fasciitis.  Raise (elevate) your foot above the level of your heart while you are sitting or lying down. Activity  Avoid activities that cause pain. Ask your health care provider what activities are safe for you.  Do physical therapy exercises and stretches as told   by your health care provider.  Try activities and forms of exercise that are easier on your joints (low-impact). Examples include swimming, water aerobics, and biking. General instructions  Take over-the-counter and prescription medicines only as told by your health care provider.  Wear a night splint while sleeping, if told by your health care provider. Loosen the splint  if your toes tingle, become numb, or turn cold and blue.  Maintain a healthy weight, or work with your health care provider to lose weight as needed.  Keep all follow-up visits as told by your health care provider. This is important. Contact a health care provider if you:  Have symptoms that do not go away after caring for yourself at home.  Have pain that gets worse.  Have pain that affects your ability to move or do your daily activities. Summary  Plantar fasciitis is a painful foot condition that affects the heel. It occurs when the band of tissue that connects the toes to the heel bone (plantar fascia) becomes irritated.  The main symptom of this condition is heel pain that may be worse after exercising too much or standing still for a long time.  Treatment varies, but it usually starts with rest, ice, compression, and elevation (RICE therapy) and over-the-counter medicines to manage pain. This information is not intended to replace advice given to you by your health care provider. Make sure you discuss any questions you have with your health care provider. Document Released: 06/18/2001 Document Revised: 07/21/2017 Document Reviewed: 07/21/2017 Elsevier Interactive Patient Education  2019 [employer] Diabetes Basics  Diabetes (diabetes mellitus) is a long-term (chronic) disease. It occurs when the body does not properly use sugar (glucose) that is released from food after you eat. Diabetes may be caused by one or both of these problems:  Your pancreas does not make enough of a hormone called insulin.  Your body does not react in a normal way to insulin that it makes. Insulin lets sugars (glucose) go into cells in your body. This gives you energy. If you have diabetes, sugars cannot get into cells. This causes high blood sugar (hyperglycemia). Follow these instructions at home: How is diabetes treated? You may need to take insulin or other diabetes medicines daily to keep your  blood sugar in balance. Take your diabetes medicines every day as told by your doctor. List your diabetes medicines here: Diabetes medicines  Name of medicine: ______________________________ ? Amount (dose): _______________ Time (a.m./p.m.): _______________ Notes: ___________________________________  Name of medicine: ______________________________ ? Amount (dose): _______________ Time (a.m./p.m.): _______________ Notes: ___________________________________  Name of medicine: ______________________________ ? Amount (dose): _______________ Time (a.m./p.m.): _______________ Notes: ___________________________________ If you use insulin, you will learn how to give yourself insulin by injection. You may need to adjust the amount based on the food that you eat. List the types of insulin you use here: Insulin  Insulin type: ______________________________ ? Amount (dose): _______________ Time (a.m./p.m.): _______________ Notes: ___________________________________  Insulin type: ______________________________ ? Amount (dose): _______________ Time (a.m./p.m.): _______________ Notes: ___________________________________  Insulin type: ______________________________ ? Amount (dose): _______________ Time (a.m./p.m.): _______________ Notes: ___________________________________  Insulin type: ______________________________ ? Amount (dose): _______________ Time (a.m./p.m.): _______________ Notes: ___________________________________  Insulin type: ______________________________ ? Amount (dose): _______________ Time (a.m./p.m.): _______________ Notes: ___________________________________ How do I manage my blood sugar?  Check your blood sugar levels using a blood glucose monitor as directed by your doctor. Your doctor will set treatment goals for you. Generally, you should have these blood sugar levels:  Before meals (preprandial): 80-130 mg/dL (  4.4-7.2 mmol/L).  After meals (postprandial): below 180  mg/dL (10 mmol/L).  A1c level: less than 7%. Write down the times that you will check your blood sugar levels: Blood sugar checks  Time: _______________ Notes: ___________________________________  Time: _______________ Notes: ___________________________________  Time: _______________ Notes: ___________________________________  Time: _______________ Notes: ___________________________________  Time: _______________ Notes: ___________________________________  Time: _______________ Notes: ___________________________________  What do I need to know about low blood sugar? Low blood sugar is called hypoglycemia. This is when blood sugar is at or below 70 mg/dL (3.9 mmol/L). Symptoms may include:  Feeling: ? Hungry. ? Worried or nervous (anxious). ? Sweaty and clammy. ? Confused. ? Dizzy. ? Sleepy. ? Sick to your stomach (nauseous).  Having: ? A fast heartbeat. ? A headache. ? A change in your vision. ? Tingling or no feeling (numbness) around the mouth, lips, or tongue. ? Jerky movements that you cannot control (seizure).  Having trouble with: ? Moving (coordination). ? Sleeping. ? Passing out (fainting). ? Getting upset easily (irritability). Treating low blood sugar To treat low blood sugar, eat or drink something sugary right away. If you can think clearly and swallow safely, follow the 15:15 rule:  Take 15 grams of a fast-acting carb (carbohydrate). Talk with your doctor about how much you should take.  Some fast-acting carbs are: ? Sugar tablets (glucose pills). Take 3-4 glucose pills. ? 6-8 pieces of hard candy. ? 4-6 oz (120-150 mL) of fruit juice. ? 4-6 oz (120-150 mL) of regular (not diet) soda. ? 1 Tbsp (15 mL) honey or sugar.  Check your blood sugar 15 minutes after you take the carb.  If your blood sugar is still at or below 70 mg/dL (3.9 mmol/L), take 15 grams of a carb again.  If your blood sugar does not go above 70 mg/dL (3.9 mmol/L) after 3 tries,  get help right away.  After your blood sugar goes back to normal, eat a meal or a snack within 1 hour. Treating very low blood sugar If your blood sugar is at or below 54 mg/dL (3 mmol/L), you have very low blood sugar (severe hypoglycemia). This is an emergency. Do not wait to see if the symptoms will go away. Get medical help right away. Call your local emergency services (911 in the [place] Do not drive yourself to the hospital. Questions to ask your health care provider  Do I need to meet with a diabetes educator?  What equipment will I need to care for myself at home?  What diabetes medicines do I need? When should I take them?  How often do I need to check my blood sugar?  What number can I call if I have questions?  When is my next doctor's visit?  Where can I find a support group for people with diabetes? Where to find more information  American Diabetes Association: www.diabetes.org  American Association of Diabetes Educators: www.diabeteseducator.org/patient-resources Contact a doctor if:  Your blood sugar is at or above 240 mg/dL (91.6 mmol/L) for 2 days in a row.  You have been sick or have had a fever for 2 days or more, and you are not getting better.  You have any of these problems for more than 6 hours: ? You cannot eat or drink. ? You feel sick to your stomach (nauseous). ? You throw up (vomit). ? You have watery poop (diarrhea). Get help right away if:  Your blood sugar is lower than 54 mg/dL (3 mmol/L).  You get confused.  You  have trouble: ? Thinking clearly. ? Breathing. Summary  Diabetes (diabetes mellitus) is a long-term (chronic) disease. It occurs when the body does not properly use sugar (glucose) that is released from food after digestion.  Take insulin and diabetes medicines as told.  Check your blood sugar every day, as often as told.  Keep all follow-up visits as told by your doctor. This is important. This information is not intended  to replace advice given to you by your health care provider. Make sure you discuss any questions you have with your health care provider. Document Released: 12/26/2017 Document Revised: 03/16/2018 Document Reviewed: 12/26/2017 Elsevier Interactive Patient Education  2019 [employer]

## 2018-11-02 NOTE — Progress Notes (Signed)
Subjective:    Patient ID: Jack Porter, male    DOB: [birth year], 38 y.o.   MRN: [id number]  Chief Complaint:  Foot Pain (left)   HPI: Jack Porter is a 38 y.o. male presenting on 11/02/2018 for Foot Pain (left)   1. Plantar fasciitis of left foot  Pt presents today with left foot pain. States this started several weeks ago and is getting worse. Pt states the pain is worse when he gets out of bed in the morning and is aggravated by walking. Pt states the pain is located on his heel. States he does have slight swelling at times. Denies injury. He has not tried anything for the pain.    2. Type 2 diabetes mellitus without complication, without long-term current use of insulin (Jack Porter)  Has been out of medications for around 4 months. States he does not have insurance and was unable to afford the medications or appointments. States he does not check his blood sugar at home. He does not watch is diet or exercise on a regular basis.  He denies polydipsia, polyuria, or polyphagia.    3. BMI 37.0-37.9, adult  Does not diet or exercise.      Relevant past medical, surgical, family, and social history reviewed and updated as indicated.  Allergies and medications reviewed and updated.   Past Medical History:  Diagnosis Date  . Diabetes mellitus without complication ([place])   . GERD (gastroesophageal reflux disease)   . H/O hiatal hernia     Past Surgical History:  Procedure Laterality Date  . CHOLECYSTECTOMY  01/21/2014  . CHOLECYSTECTOMY N/A 01/21/2014   Procedure: LAPAROSCOPIC CHOLECYSTECTOMY;  Surgeon: Gayland Curry, MD;  Location: [facility];  Service: General;  Laterality: N/A;    Social History   Socioeconomic History  . Marital status: Single    Spouse name: Not on file  . Number of children: Not on file  . Years of education: Not on file  . Highest education level: Not on file  Occupational History  . Not on file  Social Needs  . Financial resource strain: Not on file   . Food insecurity:    Worry: Not on file    Inability: Not on file  . Transportation needs:    Medical: Not on file    Non-medical: Not on file  Tobacco Use  . Smoking status: Former Smoker    Packs/day: 0.25    Years: 3.00    Pack years: 0.75    Types: Cigarettes  . Smokeless tobacco: Never Used  Substance and Sexual Activity  . Alcohol use: No    Comment: rarely  . Drug use: No  . Sexual activity: Yes  Lifestyle  . Physical activity:    Days per week: Not on file    Minutes per session: Not on file  . Stress: Not on file  Relationships  . Social connections:    Talks on phone: Not on file    Gets together: Not on file    Attends religious service: Not on file    Active member of club or organization: Not on file    Attends meetings of clubs or organizations: Not on file    Relationship status: Not on file  . Intimate partner violence:    Fear of current or ex partner: Not on file    Emotionally abused: Not on file    Physically abused: Not on file    Forced sexual activity: Not on file  Other Topics Concern  . Not on file  Social History Narrative  . Not on file    Outpatient Encounter Medications as of 11/02/2018  Medication Sig  . blood glucose meter kit and supplies KIT Dispense based on patient and insurance preference. Use up to four times daily as directed. (FOR ICD-9 250.00, 250.01). (Patient not taking: Reported on 11/02/2018)  . glipiZIDE (GLUCOTROL) 5 MG tablet Take 1 tablet (5 mg total) by mouth 2 (two) times daily before a meal.  . metFORMIN (GLUCOPHAGE) 500 MG tablet Take 1 tablet (500 mg total) by mouth 2 (two) times daily with a meal.  . naproxen (NAPROSYN) 500 MG tablet Take 1 tablet (500 mg total) by mouth 2 (two) times daily with a meal for 14 days.  . [DISCONTINUED] glipiZIDE (GLUCOTROL) 5 MG tablet Take 1 tablet (5 mg total) by mouth 2 (two) times daily before a meal. (Patient not taking: Reported on 11/02/2018)  . [DISCONTINUED] metFORMIN  (GLUCOPHAGE) 500 MG tablet Take 1 tablet (500 mg total) by mouth 2 (two) times daily with a meal. (Patient not taking: Reported on 11/02/2018)   No facility-administered encounter medications on file as of 11/02/2018.     No Known Allergies  Review of Systems  Constitutional: Negative for chills, fatigue and fever.  Eyes: Negative for photophobia and visual disturbance.  Respiratory: Negative for cough, chest tightness and shortness of breath.   Cardiovascular: Negative for chest pain, palpitations and leg swelling.  Gastrointestinal: Negative for abdominal pain, constipation, diarrhea, nausea and vomiting.  Endocrine: Negative for polydipsia, polyphagia and polyuria.  Genitourinary: Negative for frequency and urgency.  Musculoskeletal:       Left foot and heel pain  Neurological: Negative for dizziness, tremors, seizures, syncope, facial asymmetry, speech difficulty, weakness, light-headedness, numbness and headaches.  Psychiatric/Behavioral: Negative for confusion.  All other systems reviewed and are negative.       Objective:    BP (!) 126/94   Pulse (!) 115   Temp 97.7 F (36.5 C) (Oral)   Ht 6' 2" (1.88 m)   Wt 290 lb (131.5 kg)   BMI 37.23 kg/m    Wt Readings from Last 3 Encounters:  11/02/18 290 lb (131.5 kg)  02/03/18 (!) 303 lb (137.4 kg)  11/04/17 (!) 301 lb 9.6 oz (136.8 kg)    Physical Exam Vitals signs and nursing note reviewed.  Constitutional:      General: He is not in acute distress.    Appearance: Normal appearance. He is well-developed and well-groomed. He is obese. He is not ill-appearing or toxic-appearing.  HENT:     Head: Normocephalic and atraumatic.     Nose: Nose normal.     Mouth/Throat:     Mouth: Mucous membranes are moist.     Pharynx: Oropharynx is clear.  Eyes:     Extraocular Movements: Extraocular movements intact.     Conjunctiva/sclera: Conjunctivae normal.     Pupils: Pupils are equal, round, and reactive to light.  Neck:      Musculoskeletal: Full passive range of motion without pain and neck supple.     Thyroid: No thyroid mass, thyromegaly or thyroid tenderness.     Trachea: Trachea and phonation normal.     Comments: Acanthosis nigricans Cardiovascular:     Rate and Rhythm: Normal rate and regular rhythm.     Pulses: Normal pulses.     Heart sounds: Normal heart sounds. No murmur. No friction rub. No gallop.   Pulmonary:  Effort: Pulmonary effort is normal.     Breath sounds: Normal breath sounds.  Musculoskeletal:     Left foot: Normal range of motion and normal capillary refill. Tenderness present. No bony tenderness, crepitus, deformity or laceration.       Feet:  Lymphadenopathy:     Cervical: No cervical adenopathy.  Skin:    General: Skin is warm and dry.     Capillary Refill: Capillary refill takes less than 2 seconds.  Neurological:     General: No focal deficit present.     Mental Status: He is alert and oriented to person, place, and time.     Cranial Nerves: Cranial nerves are intact.     Sensory: Sensation is intact.     Motor: Motor function is intact.  Psychiatric:        Mood and Affect: Mood normal.        Behavior: Behavior normal. Behavior is cooperative.        Thought Content: Thought content normal.        Judgment: Judgment normal.     Results for orders placed or performed in visit on 02/05/18  CMP14+EGFR  Result Value Ref Range   Glucose 363 (H) 65 - 99 mg/dL   BUN 11 6 - 20 mg/dL   Creatinine, Ser 0.73 (L) 0.76 - 1.27 mg/dL   GFR calc non Af Amer 118 >59 mL/min/1.73   GFR calc Af Amer 137 >59 mL/min/1.73   BUN/Creatinine Ratio 15 9 - 20   Sodium 138 134 - 144 mmol/L   Potassium 4.5 3.5 - 5.2 mmol/L   Chloride 101 96 - 106 mmol/L   CO2 24 20 - 29 mmol/L   Calcium 9.4 8.7 - 10.2 mg/dL   Total Protein 7.4 6.0 - 8.5 g/dL   Albumin 4.1 3.5 - 5.5 g/dL   Globulin, Total 3.3 1.5 - 4.5 g/dL   Albumin/Globulin Ratio 1.2 1.2 - 2.2   Bilirubin Total 0.8 0.0 - 1.2  mg/dL   Alkaline Phosphatase 131 (H) 39 - 117 IU/L   AST 72 (H) 0 - 40 IU/L   ALT 75 (H) 0 - 44 IU/L     A1C 9.3 in office today  Pertinent labs & imaging results that were available during my care of the patient were reviewed by me and considered in my medical decision making.  Assessment & Plan:  Xavien was seen today for foot pain.  Diagnoses and all orders for this visit:  Plantar fasciitis of left foot Conservative measures discussed. Appropriate fitting shoes or inserts. Roll foot on frozen water bottle several times per day. Report any new or worsening symptoms.  -     naproxen (NAPROSYN) 500 MG tablet; Take 1 tablet (500 mg total) by mouth 2 (two) times daily with a meal for 14 days.  Type 2 diabetes mellitus without complication, without long-term current use of insulin ([facility]) A1C 9.3 in office today. Labs pending. Pt aware to wait for results prior to starting medications. Pt aware he needs to schedule an eye exam. Diet and exercise encouraged.  -     CMP14+EGFR -     Lipid panel -     Bayer DCA Hb A1c Waived -     Microalbumin / creatinine urine ratio -     TSH -     metFORMIN (GLUCOPHAGE) 500 MG tablet; Take 1 tablet (500 mg total) by mouth 2 (two) times daily with a meal. -     glipiZIDE (GLUCOTROL)  5 MG tablet; Take 1 tablet (5 mg total) by mouth 2 (two) times daily before a meal.  BMI 37.0-37.9, adult Diet and exercise encouraged.  -     CMP14+EGFR -     Lipid panel -     TSH     Continue all other maintenance medications.  Follow up plan: Return in about 4 weeks (around 11/30/2018), or if symptoms worsen or fail to improve.  Educational handout given for diabetes, plantar fascitis   The above assessment and management plan was discussed with the patient. The patient verbalized understanding of and has agreed to the management plan. Patient is aware to call the clinic if symptoms persist or worsen. Patient is aware when to return to the clinic for a follow-up  visit. Patient educated on when it is appropriate to go to the emergency department.   Michelle Rakes, FNP-C [facility] [phone]  

## 2018-11-03 LAB — CMP14+EGFR
A/G RATIO: 1.3 (ref 1.2–2.2)
ALBUMIN: 4.1 g/dL (ref 4.0–5.0)
ALK PHOS: 124 IU/L — AB (ref 39–117)
ALT: 55 IU/L — ABNORMAL HIGH (ref 0–44)
AST: 37 IU/L (ref 0–40)
BUN / CREAT RATIO: 15 (ref 9–20)
BUN: 11 mg/dL (ref 6–20)
Bilirubin Total: 0.7 mg/dL (ref 0.0–1.2)
CO2: 20 mmol/L (ref 20–29)
Calcium: 9.1 mg/dL (ref 8.7–10.2)
Chloride: 102 mmol/L (ref 96–106)
Creatinine, Ser: 0.72 mg/dL — ABNORMAL LOW (ref 0.76–1.27)
GFR calc non Af Amer: 118 mL/min/{1.73_m2} (ref >59)
GFR, EST AFRICAN AMERICAN: 137 mL/min/{1.73_m2} (ref >59)
Globulin, Total: 3.1 g/dL (ref 1.5–4.5)
Glucose: 371 mg/dL — ABNORMAL HIGH (ref 65–99)
POTASSIUM: 5 mmol/L (ref 3.5–5.2)
SODIUM: 140 mmol/L (ref 134–144)
TOTAL PROTEIN: 7.2 g/dL (ref 6.0–8.5)

## 2018-11-03 LAB — LIPID PANEL
CHOLESTEROL TOTAL: 197 mg/dL (ref 100–199)
Chol/HDL Ratio: 8.2 ratio — ABNORMAL HIGH (ref 0.0–5.0)
HDL: 24 mg/dL — ABNORMAL LOW (ref >39)
Triglycerides: 596 mg/dL (ref 0–149)

## 2018-11-03 LAB — MICROALBUMIN / CREATININE URINE RATIO
CREATININE, UR: 72 mg/dL
MICROALB/CREAT RATIO: 28 mg/g{creat} (ref 0–29)
MICROALBUM., U, RANDOM: 20 ug/mL

## 2018-11-03 LAB — TSH: TSH: 1.88 u[IU]/mL (ref 0.450–4.500)

## 2018-11-03 MED ORDER — FENOFIBRATE 145 MG PO TABS: 145.0000 mg | ORAL_TABLET | Freq: Every day | ORAL | 5 refills | Status: DC

## 2018-11-03 NOTE — Addendum Note (Signed)
Addended by: Sonny Masters on: 11/03/2018 08:12 AM   Modules accepted: Orders

## 2018-12-04 ENCOUNTER — Ambulatory Visit: Payer: Self-pay | Admitting: Family Medicine

## 2018-12-07 ENCOUNTER — Ambulatory Visit: Payer: Self-pay | Admitting: Family Medicine

## 2018-12-08 ENCOUNTER — Ambulatory Visit: Payer: Self-pay | Admitting: Family Medicine

## 2018-12-14 ENCOUNTER — Encounter: Payer: Self-pay | Admitting: Family Medicine

## 2018-12-14 ENCOUNTER — Ambulatory Visit: Payer: Self-pay | Admitting: Family Medicine

## 2018-12-14 VITALS — BP 138/84 | HR 88 | Temp 97.0°F | Ht 74.0 in | Wt 294.0 lb

## 2018-12-14 DIAGNOSIS — E1169 Type 2 diabetes mellitus with other specified complication: Secondary | ICD-10-CM

## 2018-12-14 DIAGNOSIS — E781 Pure hyperglyceridemia: Secondary | ICD-10-CM

## 2018-12-14 DIAGNOSIS — R748 Abnormal levels of other serum enzymes: Secondary | ICD-10-CM

## 2018-12-14 DIAGNOSIS — E119 Type 2 diabetes mellitus without complications: Secondary | ICD-10-CM

## 2018-12-14 LAB — BAYER DCA HB A1C WAIVED: HB A1C (BAYER DCA - WAIVED): 8 % — ABNORMAL HIGH (ref <7.0)

## 2018-12-14 MED ORDER — GLIPIZIDE 5 MG PO TABS: 5.0000 mg | ORAL_TABLET | Freq: Two times a day (BID) | ORAL | 3 refills | Status: DC

## 2018-12-14 MED ORDER — METFORMIN HCL 500 MG PO TABS: 500.0000 mg | ORAL_TABLET | Freq: Two times a day (BID) | ORAL | 3 refills | Status: DC

## 2018-12-14 MED ORDER — FENOFIBRATE 145 MG PO TABS: 145.0000 mg | ORAL_TABLET | Freq: Every day | ORAL | 5 refills | Status: DC

## 2018-12-14 NOTE — Progress Notes (Signed)
Subjective:  Patient ID: Jack Porter, male    DOB: Oct 07, 1981, 38 y.o.   MRN: [id number]  Chief Complaint:  Diabetes (follow up)   HPI: Jack Porter is a 38 y.o. male presenting on 12/14/2018 for Diabetes (follow up)   1. Type 2 diabetes mellitus without complication, without long-term current use of insulin (HCC)  Diabetes mellitus 2 Compliant with meds - Yes Checking CBGs? No Exercising regularly? - Yes Watching carbohydrate intake? - Yes Neuropathy ? - No Hypoglycemic events - No Pertinent ROS:  Polyuria - No Polydipsia - No Vision problems - No   2. Elevated liver enzymes  Denies abdominal pain or icterus. No abnormal stools or confusion. No excessive alcohol or tylenol.    3. Type 2 diabetes mellitus with hypertriglyceridemia (HCC)  Compliant with fenofibrate. Has been watching diet and exercising.       Relevant past medical, surgical, family, and social history reviewed and updated as indicated.  Allergies and medications reviewed and updated.   Past Medical History:  Diagnosis Date  . Diabetes mellitus without complication ([place])   . GERD (gastroesophageal reflux disease)   . H/O hiatal hernia     Past Surgical History:  Procedure Laterality Date  . CHOLECYSTECTOMY  01/21/2014  . CHOLECYSTECTOMY N/A 01/21/2014   Procedure: LAPAROSCOPIC CHOLECYSTECTOMY;  Surgeon: Gayland Curry, MD;  Location: [facility];  Service: General;  Laterality: N/A;    Social History   Socioeconomic History  . Marital status: Single    Spouse name: Not on file  . Number of children: Not on file  . Years of education: Not on file  . Highest education level: Not on file  Occupational History  . Not on file  Social Needs  . Financial resource strain: Not on file  . Food insecurity:    Worry: Not on file    Inability: Not on file  . Transportation needs:    Medical: Not on file    Non-medical: Not on file  Tobacco Use  . Smoking status: Former Smoker    Packs/day: 0.25     Years: 3.00    Pack years: 0.75    Types: Cigarettes  . Smokeless tobacco: Never Used  Substance and Sexual Activity  . Alcohol use: No    Comment: rarely  . Drug use: No  . Sexual activity: Yes  Lifestyle  . Physical activity:    Days per week: Not on file    Minutes per session: Not on file  . Stress: Not on file  Relationships  . Social connections:    Talks on phone: Not on file    Gets together: Not on file    Attends religious service: Not on file    Active member of club or organization: Not on file    Attends meetings of clubs or organizations: Not on file    Relationship status: Not on file  . Intimate partner violence:    Fear of current or ex partner: Not on file    Emotionally abused: Not on file    Physically abused: Not on file    Forced sexual activity: Not on file  Other Topics Concern  . Not on file  Social History Narrative  . Not on file    Outpatient Encounter Medications as of 12/14/2018  Medication Sig  . blood glucose meter kit and supplies KIT Dispense based on patient and insurance preference. Use up to four times daily as directed. (FOR ICD-9 250.00,  250.01).  . fenofibrate (TRICOR) 145 MG tablet Take 1 tablet (145 mg total) by mouth daily.  Marland Kitchen glipiZIDE (GLUCOTROL) 5 MG tablet Take 1 tablet (5 mg total) by mouth 2 (two) times daily before a meal.  . metFORMIN (GLUCOPHAGE) 500 MG tablet Take 1 tablet (500 mg total) by mouth 2 (two) times daily with a meal.  . [DISCONTINUED] fenofibrate (TRICOR) 145 MG tablet Take 1 tablet (145 mg total) by mouth daily.  . [DISCONTINUED] glipiZIDE (GLUCOTROL) 5 MG tablet Take 1 tablet (5 mg total) by mouth 2 (two) times daily before a meal.  . [DISCONTINUED] metFORMIN (GLUCOPHAGE) 500 MG tablet Take 1 tablet (500 mg total) by mouth 2 (two) times daily with a meal.   No facility-administered encounter medications on file as of 12/14/2018.     No Known Allergies  Review of Systems  Constitutional: Negative for  activity change, appetite change, chills, diaphoresis, fatigue, fever and unexpected weight change.  Respiratory: Negative for cough and shortness of breath.   Cardiovascular: Negative for chest pain, palpitations and leg swelling.  Gastrointestinal: Negative for abdominal pain, anal bleeding, blood in stool, constipation, diarrhea, nausea and vomiting.  Endocrine: Negative for cold intolerance, heat intolerance, polydipsia, polyphagia and polyuria.  Genitourinary: Negative for difficulty urinating and frequency.  Musculoskeletal: Negative for arthralgias and myalgias.  Neurological: Negative for dizziness, tremors, seizures, syncope, facial asymmetry, speech difficulty, weakness, light-headedness, numbness and headaches.  Psychiatric/Behavioral: Negative for confusion.  All other systems reviewed and are negative.       Objective:  BP 138/84   Pulse 88   Temp (!) 97 F (36.1 C) (Oral)   Ht _0  (1.88 m)   Wt 294 lb (133.4 kg)   BMI 37.75 kg/m    Wt Readings from Last 3 Encounters:  12/14/18 294 lb (133.4 kg)  11/02/18 290 lb (131.5 kg)  02/03/18 (!) 303 lb (137.4 kg)    Physical Exam Vitals signs and nursing note reviewed.  Constitutional:      General: He is not in acute distress.    Appearance: He is well-developed and well-groomed. He is obese. He is not ill-appearing, toxic-appearing or diaphoretic.  HENT:     Head: Normocephalic and atraumatic.     Mouth/Throat:     Mouth: Mucous membranes are moist.     Pharynx: Oropharynx is clear.  Eyes:     Conjunctiva/sclera: Conjunctivae normal.     Pupils: Pupils are equal, round, and reactive to light.  Neck:     Musculoskeletal: Normal range of motion and neck supple.     Thyroid: No thyromegaly.     Trachea: No tracheal deviation.  Cardiovascular:     Rate and Rhythm: Normal rate and regular rhythm.     Heart sounds: Normal heart sounds. No murmur. No friction rub. No gallop.   Pulmonary:     Effort: Pulmonary  effort is normal. No respiratory distress.     Breath sounds: Normal breath sounds. No wheezing.  Abdominal:     General: Bowel sounds are normal.     Palpations: Abdomen is soft.     Tenderness: There is no abdominal tenderness.  Musculoskeletal: Normal range of motion.  Skin:    General: Skin is warm and dry.     Capillary Refill: Capillary refill takes less than 2 seconds.  Neurological:     General: No focal deficit present.     Mental Status: He is alert and oriented to person, place, and time.  Psychiatric:  Behavior: Behavior normal. Behavior is cooperative.        Thought Content: Thought content normal.        Judgment: Judgment normal.     Results for orders placed or performed in visit on 11/02/18  CMP14+EGFR  Result Value Ref Range   Glucose 371 (H) 65 - 99 mg/dL   BUN 11 6 - 20 mg/dL   Creatinine, Ser 0.72 (L) 0.76 - 1.27 mg/dL   GFR calc non Af Amer 118 >59 mL/min/1.73   GFR calc Af Amer 137 >59 mL/min/1.73   BUN/Creatinine Ratio 15 9 - 20   Sodium 140 134 - 144 mmol/L   Potassium 5.0 3.5 - 5.2 mmol/L   Chloride 102 96 - 106 mmol/L   CO2 20 20 - 29 mmol/L   Calcium 9.1 8.7 - 10.2 mg/dL   Total Protein 7.2 6.0 - 8.5 g/dL   Albumin 4.1 4.0 - 5.0 g/dL   Globulin, Total 3.1 1.5 - 4.5 g/dL   Albumin/Globulin Ratio 1.3 1.2 - 2.2   Bilirubin Total 0.7 0.0 - 1.2 mg/dL   Alkaline Phosphatase 124 (H) 39 - 117 IU/L   AST 37 0 - 40 IU/L   ALT 55 (H) 0 - 44 IU/L  Lipid panel  Result Value Ref Range   Cholesterol, Total 197 100 - 199 mg/dL   Triglycerides 596 (HH) 0 - 149 mg/dL   HDL 24 (L) >39 mg/dL   VLDL Cholesterol Cal Comment 5 - 40 mg/dL   LDL Calculated Comment 0 - 99 mg/dL   Chol/HDL Ratio 8.2 (H) 0.0 - 5.0 ratio  Bayer DCA Hb A1c Waived  Result Value Ref Range   HB A1C (BAYER DCA - WAIVED) 9.3 (H) <7.0 %  Microalbumin / creatinine urine ratio  Result Value Ref Range   Creatinine, Urine 72.0 Not Estab. mg/dL   Microalbumin, Urine 20.0 Not Estab.  ug/mL   Microalb/Creat Ratio 28 0 - 29 mg/g creat  TSH  Result Value Ref Range   TSH 1.880 0.450 - 4.500 uIU/mL     A1C 8.0 today.   Pertinent labs & imaging results that were available during my care of the patient were reviewed by me and considered in my medical decision making.  Assessment & Plan:  Kaid was seen today for diabetes.  Diagnoses and all orders for this visit:  Type 2 diabetes mellitus without complication, without long-term current use of insulin (HCC) A1C down to 8 today in office. Will recheck in three months and change therapy if it remains elevated. Diet and exercise encouraged.  -     Bayer DCA Hb A1c Waived -     glipiZIDE (GLUCOTROL) 5 MG tablet; Take 1 tablet (5 mg total) by mouth 2 (two) times daily before a meal. -     metFORMIN (GLUCOPHAGE) 500 MG tablet; Take 1 tablet (500 mg total) by mouth 2 (two) times daily with a meal.  Elevated liver enzymes Labs pending, if remain elevated will order [place].  -     CMP14+EGFR -     Bayer DCA Hb A1c Waived  Type 2 diabetes mellitus with hypertriglyceridemia (HCC) Diet and exercise encouraged. Medications as prescribed. If levels remain elevated, will add statin.  -     CMP14+EGFR -     Lipid panel -     fenofibrate (TRICOR) 145 MG tablet; Take 1 tablet (145 mg total) by mouth daily.     Continue all other maintenance medications.  Follow up plan: Return  in about 3 months (around 03/16/2019), or if symptoms worsen or fail to improve, for DM.  Educational handout given for Diabetes and nutrition   The above assessment and management plan was discussed with the patient. The patient verbalized understanding of and has agreed to the management plan. Patient is aware to call the clinic if symptoms persist or worsen. Patient is aware when to return to the clinic for a follow-up visit. Patient educated on when it is appropriate to go to the emergency department.   Monia Pouch, FNP-C [facility] [phone]

## 2018-12-14 NOTE — Patient Instructions (Signed)
Diabetes Mellitus and Nutrition, Adult  When you have diabetes (diabetes mellitus), it is very important to have healthy eating habits because your blood sugar (glucose) levels are greatly affected by what you eat and drink. Eating healthy foods in the appropriate amounts, at about the same times every day, can help you:  · Control your blood glucose.  · Lower your risk of heart disease.  · Improve your blood pressure.  · Reach or maintain a healthy weight.  Every person with diabetes is different, and each person has different needs for a meal plan. Your health care provider may recommend that you work with a diet and nutrition specialist (dietitian) to make a meal plan that is best for you. Your meal plan may vary depending on factors such as:  · The calories you need.  · The medicines you take.  · Your weight.  · Your blood glucose, blood pressure, and cholesterol levels.  · Your activity level.  · Other health conditions you have, such as heart or kidney disease.  How do carbohydrates affect me?  Carbohydrates, also called carbs, affect your blood glucose level more than any other type of food. Eating carbs naturally raises the amount of glucose in your blood. Carb counting is a method for keeping track of how many carbs you eat. Counting carbs is important to keep your blood glucose at a healthy level, especially if you use insulin or take certain oral diabetes medicines.  It is important to know how many carbs you can safely have in each meal. This is different for every person. Your dietitian can help you calculate how many carbs you should have at each meal and for each snack.  Foods that contain carbs include:  · Bread, cereal, rice, pasta, and crackers.  · Potatoes and corn.  · Peas, beans, and lentils.  · Milk and yogurt.  · Fruit and juice.  · Desserts, such as cakes, cookies, ice cream, and candy.  How does alcohol affect me?  Alcohol can cause a sudden decrease in blood glucose (hypoglycemia),  especially if you use insulin or take certain oral diabetes medicines. Hypoglycemia can be a life-threatening condition. Symptoms of hypoglycemia (sleepiness, dizziness, and confusion) are similar to symptoms of having too much alcohol.  If your health care provider says that alcohol is safe for you, follow these guidelines:  · Limit alcohol intake to no more than 1 drink per day for nonpregnant women and 2 drinks per day for men. One drink equals 12 oz of beer, 5 oz of wine, or 1½ oz of hard liquor.  · Do not drink on an empty stomach.  · Keep yourself hydrated with water, diet soda, or unsweetened iced tea.  · Keep in mind that regular soda, juice, and other mixers may contain a lot of sugar and must be counted as carbs.  What are tips for following this plan?    Reading food labels  · Start by checking the serving size on the "Nutrition Facts" label of packaged foods and drinks. The amount of calories, carbs, fats, and other nutrients listed on the label is based on one serving of the item. Many items contain more than one serving per package.  · Check the total grams (g) of carbs in one serving. You can calculate the number of servings of carbs in one serving by dividing the total carbs by 15. For example, if a food has 30 g of total carbs, it would be equal to 2   servings of carbs.  · Check the number of grams (g) of saturated and trans fats in one serving. Choose foods that have low or no amount of these fats.  · Check the number of milligrams (mg) of salt (sodium) in one serving. Most people should limit total sodium intake to less than 2,300 mg per day.  · Always check the nutrition information of foods labeled as "low-fat" or "nonfat". These foods may be higher in added sugar or refined carbs and should be avoided.  · Talk to your dietitian to identify your daily goals for nutrients listed on the label.  Shopping  · Avoid buying canned, premade, or processed foods. These foods tend to be high in fat, sodium,  and added sugar.  · Shop around the outside edge of the grocery store. This includes fresh fruits and vegetables, bulk grains, fresh meats, and fresh dairy.  Cooking  · Use low-heat cooking methods, such as baking, instead of high-heat cooking methods like deep frying.  · Cook using healthy oils, such as olive, canola, or sunflower oil.  · Avoid cooking with butter, cream, or high-fat meats.  Meal planning  · Eat meals and snacks regularly, preferably at the same times every day. Avoid going long periods of time without eating.  · Eat foods high in fiber, such as fresh fruits, vegetables, beans, and whole grains. Talk to your dietitian about how many servings of carbs you can eat at each meal.  · Eat 4-6 ounces (oz) of lean protein each day, such as lean meat, chicken, fish, eggs, or tofu. One oz of lean protein is equal to:  ? 1 oz of meat, chicken, or fish.  ? 1 egg.  ? ¼ cup of tofu.  · Eat some foods each day that contain healthy fats, such as avocado, nuts, seeds, and fish.  Lifestyle  · Check your blood glucose regularly.  · Exercise regularly as told by your health care provider. This may include:  ? 150 minutes of moderate-intensity or vigorous-intensity exercise each week. This could be brisk walking, biking, or water aerobics.  ? Stretching and doing strength exercises, such as yoga or weightlifting, at least 2 times a week.  · Take medicines as told by your health care provider.  · Do not use any products that contain nicotine or tobacco, such as cigarettes and e-cigarettes. If you need help quitting, ask your health care provider.  · Work with a counselor or diabetes educator to identify strategies to manage stress and any emotional and social challenges.  Questions to ask a health care provider  · Do I need to meet with a diabetes educator?  · Do I need to meet with a dietitian?  · What number can I call if I have questions?  · When are the best times to check my blood glucose?  Where to find more  information:  · American Diabetes Association: diabetes.org  · Academy of Nutrition and Dietetics: www.eatright.org  · National Institute of Diabetes and Digestive and Kidney Diseases (NIH): www.niddk.nih.gov  Summary  · A healthy meal plan will help you control your blood glucose and maintain a healthy lifestyle.  · Working with a diet and nutrition specialist (dietitian) can help you make a meal plan that is best for you.  · Keep in mind that carbohydrates (carbs) and alcohol have immediate effects on your blood glucose levels. It is important to count carbs and to use alcohol carefully.  This information is not intended to   replace advice given to you by your health care provider. Make sure you discuss any questions you have with your health care provider.  Document Released: 06/20/2005 Document Revised: 04/23/2017 Document Reviewed: 10/28/2016  Elsevier Interactive Patient Education © 2019 [employer]

## 2018-12-15 LAB — CMP14+EGFR
ALBUMIN: 4.3 g/dL (ref 4.0–5.0)
ALT: 63 IU/L — ABNORMAL HIGH (ref 0–44)
AST: 38 IU/L (ref 0–40)
Albumin/Globulin Ratio: 1.4 (ref 1.2–2.2)
Alkaline Phosphatase: 100 IU/L (ref 39–117)
BUN/Creatinine Ratio: 16 (ref 9–20)
BUN: 11 mg/dL (ref 6–20)
Bilirubin Total: 0.9 mg/dL (ref 0.0–1.2)
CO2: 22 mmol/L (ref 20–29)
Calcium: 9.6 mg/dL (ref 8.7–10.2)
Chloride: 102 mmol/L (ref 96–106)
Creatinine, Ser: 0.69 mg/dL — ABNORMAL LOW (ref 0.76–1.27)
GFR calc Af Amer: 139 mL/min/{1.73_m2} (ref >59)
GFR calc non Af Amer: 120 mL/min/{1.73_m2} (ref >59)
Globulin, Total: 3.1 g/dL (ref 1.5–4.5)
Glucose: 218 mg/dL — ABNORMAL HIGH (ref 65–99)
Potassium: 5.1 mmol/L (ref 3.5–5.2)
Sodium: 139 mmol/L (ref 134–144)
Total Protein: 7.4 g/dL (ref 6.0–8.5)

## 2018-12-15 LAB — LIPID PANEL
Chol/HDL Ratio: 6 ratio — ABNORMAL HIGH (ref 0.0–5.0)
Cholesterol, Total: 173 mg/dL (ref 100–199)
HDL: 29 mg/dL — ABNORMAL LOW (ref >39)
LDL Calculated: 90 mg/dL (ref 0–99)
Triglycerides: 272 mg/dL — ABNORMAL HIGH (ref 0–149)
VLDL Cholesterol Cal: 54 mg/dL — ABNORMAL HIGH (ref 5–40)

## 2019-02-16 ENCOUNTER — Ambulatory Visit (INDEPENDENT_AMBULATORY_CARE_PROVIDER_SITE_OTHER): Payer: Self-pay | Admitting: Family Medicine

## 2019-02-16 ENCOUNTER — Other Ambulatory Visit: Payer: Self-pay

## 2019-02-16 ENCOUNTER — Encounter: Payer: Self-pay | Admitting: Family Medicine

## 2019-02-16 DIAGNOSIS — H00014 Hordeolum externum left upper eyelid: Secondary | ICD-10-CM

## 2019-02-16 MED ORDER — ERYTHROMYCIN 5 MG/GM OP OINT: 1.0000 "application " | TOPICAL_OINTMENT | Freq: Every day | OPHTHALMIC | 0 refills | Status: AC

## 2019-02-16 MED ORDER — ERYTHROMYCIN 5 MG/GM OP OINT: 1.0000 "application " | TOPICAL_OINTMENT | Freq: Every day | OPHTHALMIC | 0 refills | Status: DC

## 2019-02-16 NOTE — Progress Notes (Signed)
Virtual Visit via telephone Note Due to COVID-19, visit is conducted virtually and was requested by patient. This visit type was conducted due to national recommendations for restrictions regarding the COVID-19 Pandemic (e.g. social distancing) in an effort to limit this patient's exposure and mitigate transmission in our community. All issues noted in this document were discussed and addressed.  A physical exam was not performed with this format.   I connected with Jack Porter on 02/16/19 at 1005 by telephone and verified that I am speaking with the correct person using two identifiers. Jack Porter is currently located at home and family is currently with them during visit. The provider, Monia Pouch, FNP is located in their office at time of visit.  I discussed the limitations, risks, security and privacy concerns of performing an evaluation and management service by telephone and the availability of in person appointments. I also discussed with the patient that there may be a patient responsible charge related to this service. The patient expressed understanding and agreed to proceed.  Subjective:  Patient ID: Jack Porter, male    DOB: 03/15/1981, 38 y.o.   MRN: [id number]  Chief Complaint:  Eye Problem   HPI: Jack Porter is a 38 y.o. male presenting on 02/16/2019 for Eye Problem   Pt reports an ongoing lesion with redness and swelling to his left upper eyelid. This started 3 weeks ago and is becoming worse. Has tried warm compresses with no relief.   Eye Problem   The left eye is affected. This is a new problem. The current episode started 1 to 4 weeks ago. The problem occurs constantly. The problem has been gradually worsening. There was no injury mechanism. The pain is at a severity of 2/10. The pain is mild. There is no known exposure to pink eye. He does not wear contacts. Associated symptoms include an eye discharge, eye redness (to upper eyelid) and itching. Pertinent  negatives include no blurred vision, double vision, fever, foreign body sensation, nausea, photophobia, recent URI, vomiting or weakness. Treatments tried: warm compresses. The treatment provided no relief.     Relevant past medical, surgical, family, and social history reviewed and updated as indicated.  Allergies and medications reviewed and updated.   Past Medical History:  Diagnosis Date   Diabetes mellitus without complication (HCC)    GERD (gastroesophageal reflux disease)    H/O hiatal hernia     Past Surgical History:  Procedure Laterality Date   CHOLECYSTECTOMY  01/21/2014   CHOLECYSTECTOMY N/A 01/21/2014   Procedure: LAPAROSCOPIC CHOLECYSTECTOMY;  Surgeon: Gayland Curry, MD;  Location: [facility];  Service: General;  Laterality: N/A;    Social History   Socioeconomic History   Marital status: Single    Spouse name: Not on file   Number of children: Not on file   Years of education: Not on file   Highest education level: Not on file  Occupational History   Not on file  Social Needs   Financial resource strain: Not on file   Food insecurity:    Worry: Not on file    Inability: Not on file   Transportation needs:    Medical: Not on file    Non-medical: Not on file  Tobacco Use   Smoking status: Former Smoker    Packs/day: 0.25    Years: 3.00    Pack years: 0.75    Types: Cigarettes   Smokeless tobacco: Never Used  Substance and Sexual Activity  Alcohol use: No    Comment: rarely   Drug use: No   Sexual activity: Yes  Lifestyle   Physical activity:    Days per week: Not on file    Minutes per session: Not on file   Stress: Not on file  Relationships   Social connections:    Talks on phone: Not on file    Gets together: Not on file    Attends religious service: Not on file    Active member of club or organization: Not on file    Attends meetings of clubs or organizations: Not on file    Relationship status: Not on file   Intimate  partner violence:    Fear of current or ex partner: Not on file    Emotionally abused: Not on file    Physically abused: Not on file    Forced sexual activity: Not on file  Other Topics Concern   Not on file  Social History Narrative   Not on file    Outpatient Encounter Medications as of 02/16/2019  Medication Sig   blood glucose meter kit and supplies KIT Dispense based on patient and insurance preference. Use up to four times daily as directed. (FOR ICD-9 250.00, 250.01).   erythromycin ophthalmic ointment Place 1 application into the left eye at bedtime for 5 days.   fenofibrate (TRICOR) 145 MG tablet Take 1 tablet (145 mg total) by mouth daily.   glipiZIDE (GLUCOTROL) 5 MG tablet Take 1 tablet (5 mg total) by mouth 2 (two) times daily before a meal.   metFORMIN (GLUCOPHAGE) 500 MG tablet Take 1 tablet (500 mg total) by mouth 2 (two) times daily with a meal.   No facility-administered encounter medications on file as of 02/16/2019.     No Known Allergies  Review of Systems  Constitutional: Negative for chills and fever.  Eyes: Positive for pain (slight discomfort), discharge, redness (to upper eyelid) and itching. Negative for blurred vision, double vision, photophobia and visual disturbance.  Respiratory: Negative for cough, chest tightness and shortness of breath.   Cardiovascular: Negative for chest pain and palpitations.  Gastrointestinal: Negative for nausea and vomiting.  Neurological: Negative for weakness.  Psychiatric/Behavioral: Negative for confusion.  All other systems reviewed and are negative.        Observations/Objective: No vital signs or physical exam, this was a telephone or virtual health encounter.  Pt alert and oriented, answers all questions appropriately, and able to speak in full sentences.  Picture sent to provider. Red raised lesion to left superior eyelid with surrounding erythema. No drainage noted in image.   Assessment and Plan: Jack Porter  was seen today for eye problem.  Diagnoses and all orders for this visit:  Hordeolum externum of left upper eyelid Due to reported symptoms and image provided, will treat with antibiotic ointment as prescribed. Symptomatic care discussed: warm compresses several times daily. Wash with baby shampoo twice daily. Can massage lid to facilitate drainage. Report any new or worsening symptoms.  -     erythromycin ophthalmic ointment; Place 1 application into the left eye at bedtime for 5 days.     Follow Up Instructions: Return if symptoms worsen or fail to improve.    I discussed the assessment and treatment plan with the patient. The patient was provided an opportunity to ask questions and all were answered. The patient agreed with the plan and demonstrated an understanding of the instructions.   The patient was advised to call back or seek  an in-person evaluation if the symptoms worsen or if the condition fails to improve as anticipated.  The above assessment and management plan was discussed with the patient. The patient verbalized understanding of and has agreed to the management plan. Patient is aware to call the clinic if symptoms persist or worsen. Patient is aware when to return to the clinic for a follow-up visit. Patient educated on when it is appropriate to go to the emergency department.    I provided 15 minutes of non-face-to-face time during this encounter. The call started at 1005. The call ended at 1020. The other time was used for coordination of care.    Monia Pouch, FNP-C [facility] [address] [place], [place] [address] [phone]

## 2019-03-16 ENCOUNTER — Ambulatory Visit: Payer: Self-pay | Admitting: Family Medicine

## 2019-04-21 ENCOUNTER — Other Ambulatory Visit: Payer: Self-pay | Admitting: Family Medicine

## 2019-04-21 DIAGNOSIS — E119 Type 2 diabetes mellitus without complications: Secondary | ICD-10-CM

## 2019-07-09 ENCOUNTER — Other Ambulatory Visit: Payer: Self-pay

## 2019-07-09 DIAGNOSIS — E1169 Type 2 diabetes mellitus with other specified complication: Secondary | ICD-10-CM

## 2019-07-09 DIAGNOSIS — E119 Type 2 diabetes mellitus without complications: Secondary | ICD-10-CM

## 2019-07-09 MED ORDER — FENOFIBRATE 145 MG PO TABS: 145.0000 mg | ORAL_TABLET | Freq: Every day | ORAL | 0 refills | Status: DC

## 2019-07-09 MED ORDER — METFORMIN HCL 500 MG PO TABS: 500.0000 mg | ORAL_TABLET | Freq: Two times a day (BID) | ORAL | 0 refills | Status: DC

## 2019-07-09 MED ORDER — GLIPIZIDE 5 MG PO TABS: 5.0000 mg | ORAL_TABLET | Freq: Two times a day (BID) | ORAL | 0 refills | Status: DC

## 2019-07-23 ENCOUNTER — Other Ambulatory Visit: Payer: Self-pay

## 2019-07-26 ENCOUNTER — Other Ambulatory Visit: Payer: Self-pay

## 2019-07-26 ENCOUNTER — Encounter: Payer: Self-pay | Admitting: Family Medicine

## 2019-07-26 ENCOUNTER — Ambulatory Visit (INDEPENDENT_AMBULATORY_CARE_PROVIDER_SITE_OTHER): Payer: Self-pay | Admitting: Family Medicine

## 2019-07-26 VITALS — BP 133/82 | HR 102 | Temp 99.3°F | Resp 20 | Ht 74.0 in | Wt 295.0 lb

## 2019-07-26 DIAGNOSIS — R748 Abnormal levels of other serum enzymes: Secondary | ICD-10-CM

## 2019-07-26 DIAGNOSIS — E119 Type 2 diabetes mellitus without complications: Secondary | ICD-10-CM

## 2019-07-26 LAB — BAYER DCA HB A1C WAIVED: HB A1C (BAYER DCA - WAIVED): 10.6 % — ABNORMAL HIGH (ref <7.0)

## 2019-07-26 MED ORDER — SITAGLIP PHOS-METFORMIN HCL ER 50-1000 MG PO TB24: 1.0000 | ORAL_TABLET | Freq: Every day | ORAL | 6 refills | Status: DC

## 2019-07-26 NOTE — Progress Notes (Addendum)
Subjective:  Patient ID: Jack Porter, male    DOB: December 13, 1980  Age: 38 y.o. MRN: [id number]  CC: Medical Management of Chronic Issues (3-6 mo ) and Diabetes   HPI Jack Porter presents for Follow-up of Type 2 diabetes. Patient does not check blood sugar at home  Compliant with meds - No, often does not take his medication in the evenings Checking CBGs? About once every 2 weeks, last time sugar was 178 Exercising regularly? - Yes, walks on treadmill 2x wk for 30-40 mins, does manual labor with lifting for work Watching carbohydrate intake? - has stopped drinking sodas, otherwise admits diet is "not good"  Neuropathy ? - No Hypoglycemic events - No    Pertinent ROS:  Polyuria - No Polydipsia - No Vision problems - No  Medications as noted below. Taking them when he remembers without complication/adverse reaction being reported today. States when he is running low on his medications he tries to ration them.   History Jack Porter has a past medical history of Diabetes mellitus without complication ([place]), GERD (gastroesophageal reflux disease), and H/O hiatal hernia.   He has a past surgical history that includes Cholecystectomy (01/21/2014) and Cholecystectomy (N/A, 01/21/2014).   His family history includes Diabetes in his mother and sister; Heart disease in his sister.He reports that he has quit smoking. His smoking use included cigarettes. He has a 0.75 pack-year smoking history. He has never used smokeless tobacco. He reports that he does not drink alcohol or use drugs.  Current Outpatient Medications on File Prior to Visit  Medication Sig Dispense Refill  . blood glucose meter kit and supplies KIT Dispense based on patient and insurance preference. Use up to four times daily as directed. (FOR ICD-9 250.00, 250.01). 1 each 0  . fenofibrate (TRICOR) 145 MG tablet Take 1 tablet (145 mg total) by mouth daily. 30 tablet 0  . glipiZIDE (GLUCOTROL) 5 MG tablet Take 1 tablet (5 mg total)  by mouth 2 (two) times daily before a meal. (Needs to be seen before next refill) 60 tablet 0   No current facility-administered medications on file prior to visit.     ROS Review of Systems  Constitutional: Positive for fatigue. Negative for fever and unexpected weight change.  HENT: Negative for ear pain and sore throat.   Eyes: Negative for visual disturbance.  Respiratory: Negative for cough and shortness of breath.   Cardiovascular: Negative for chest pain, palpitations and leg swelling.  Gastrointestinal: Negative for abdominal pain, constipation, diarrhea, nausea and vomiting.  Endocrine: Negative for polydipsia, polyphagia and polyuria.  Genitourinary: Negative for dysuria.  Musculoskeletal: Negative for myalgias.  Skin: Negative for color change, rash and wound.  Neurological: Negative for dizziness, light-headedness and numbness.    Objective:  BP 133/82   Pulse (!) 102   Temp 99.3 F (37.4 C)   Resp 20   Ht _0  (1.88 m)   Wt 295 lb (133.8 kg)   SpO2 99%   BMI 37.88 kg/m   BP Readings from Last 3 Encounters:  07/26/19 133/82  12/14/18 138/84  11/02/18 (!) 126/94    Wt Readings from Last 3 Encounters:  07/26/19 295 lb (133.8 kg)  12/14/18 294 lb (133.4 kg)  11/02/18 290 lb (131.5 kg)    Physical Exam Vitals signs and nursing note reviewed.  Constitutional:      General: He is not in acute distress.    Appearance: Normal appearance. He is obese. He is not ill-appearing, toxic-appearing or  diaphoretic.  HENT:     Head: Normocephalic and atraumatic.  Eyes:     Extraocular Movements: Extraocular movements intact.     Pupils: Pupils are equal, round, and reactive to light.  Neck:     Musculoskeletal: Normal range of motion and neck supple.  Cardiovascular:     Rate and Rhythm: Normal rate and regular rhythm.     Heart sounds: Normal heart sounds. No murmur.  Pulmonary:     Effort: Pulmonary effort is normal.     Breath sounds: Normal breath sounds.   Abdominal:     General: Bowel sounds are normal.     Palpations: Abdomen is soft.     Tenderness: There is no abdominal tenderness.  Musculoskeletal: Normal range of motion.  Skin:    General: Skin is warm and dry.  Neurological:     Mental Status: He is alert and oriented to person, place, and time. Mental status is at baseline.  Psychiatric:        Mood and Affect: Mood normal.        Behavior: Behavior normal.        Thought Content: Thought content normal.        Judgment: Judgment normal.     Lab Results  Component Value Date   HGBA1C 10.6 (H) 07/26/2019   HGBA1C 8.0 (H) 12/14/2018   HGBA1C 9.3 (H) 11/02/2018    Lab Results  Component Value Date   WBC 6.4 11/04/2017   HGB 16.6 11/04/2017   HCT 47.6 11/04/2017   PLT 193 11/04/2017   GLUCOSE 218 (H) 12/14/2018   CHOL 173 12/14/2018   TRIG 272 (H) 12/14/2018   HDL 29 (L) 12/14/2018   LDLCALC 90 12/14/2018   ALT 63 (H) 12/14/2018   AST 38 12/14/2018   NA 139 12/14/2018   K 5.1 12/14/2018   CL 102 12/14/2018   CREATININE 0.69 (L) 12/14/2018   BUN 11 12/14/2018   CO2 22 12/14/2018   TSH 1.880 11/02/2018   HGBA1C 10.6 (H) 07/26/2019     Assessment & Plan:   Jack Porter was seen today for medical management of chronic issues and diabetes.  Diagnoses and all orders for this visit:  Type 2 diabetes mellitus without complication, without long-term current use of insulin ([facility]) Lab Results  Component Value Date   HGBA1C 10.6 (H) 07/26/2019   HGBA1C 8.0 (H) 12/14/2018   HGBA1C 9.3 (H) 11/02/2018  Discussed and reviewed A1C of 10.6 at today's visit.    Diabetes Control: uncontrolled Instruction/counseling given: reminded to get eye exam, discussed foot care, discussed the need for weight loss, discussed diet and other instruction/counseling: discussed importance of medication adherence   1.  Rx changes: discontinued metformin, start Janumet XR 2.  Education: Reviewed 'ABCs' of diabetes management (respective goals in  parentheses):  A1C (<7), blood pressure (<130/80), BMI (<25), and cholesterol (LDL <100). 3.  Discussed pathophysiology of DM; difference between type 1 and type 2 DM. 4.  CHO counting diet discussed.  Reviewed CHO amount in various foods and how to read nutrition labels.  Discussed recommended serving sizes.  5.  Recommend check BG several times a week and record readings to bring to next appointment. Report persistent high or low readings. 6.  Recommended increase physical activity - goal is 150 minutes per week and advance as tolerated. 7.  Adequate sleep, at least 6-8 hours per night.  8.  Smoking cessation.  9.  Follow up: 3 month   -  CBC with Differential/Platelet -     CMP14+EGFR -     Lipid panel -     Bayer DCA Hb A1c Waived -     SitaGLIPtin-MetFORMIN HCl 50-1000 MG TB24; Take 1 tablet by mouth daily.  Elevated liver enzymes Labs pending as below. Discussed diet.  -     CBC with Differential/Platelet -     CMP14+EGFR   I have discontinued Paola R. Holten's metFORMIN. I am also having him start on SitaGLIPtin-MetFORMIN HCl. Additionally, I am having him maintain his blood glucose meter kit and supplies, glipiZIDE, and fenofibrate.  Meds ordered this encounter  Medications  . SitaGLIPtin-MetFORMIN HCl 50-1000 MG TB24    Sig: Take 1 tablet by mouth daily.    Dispense:  30 tablet    Refill:  6    Order Specific Question:   Supervising Provider    Answer:   Claretta Fraise [phone]     Follow-up:   Return in about 3 months (around 10/26/2019), or if symptoms worsen or fail to improve, for DM.  The above assessment and management plan was discussed with the patient. The patient verbalized understanding of and has agreed to the management plan. Patient is aware to call the clinic if symptoms fail to improve or worsen. Patient is aware when to return to the clinic for a follow-up visit. Patient educated on when it is appropriate to go to the emergency department.   Marjorie Smolder, RN, FNP student [facility] [address] [place], [place] [address] [phone]   I personally was present during the history, physical exam, and medical decision-making activities of this service and have verified that the service and findings are accurately documented in the nurse practitioner student's note.  Monia Pouch, FNP-C [facility] [address] [place], [place] [address] [phone]

## 2019-07-26 NOTE — Patient Instructions (Signed)

## 2019-07-27 LAB — CMP14+EGFR
ALT: 75 IU/L — ABNORMAL HIGH (ref 0–44)
AST: 63 IU/L — ABNORMAL HIGH (ref 0–40)
Albumin/Globulin Ratio: 1.3 (ref 1.2–2.2)
Albumin: 4.2 g/dL (ref 4.0–5.0)
Alkaline Phosphatase: 132 IU/L — ABNORMAL HIGH (ref 39–117)
BUN/Creatinine Ratio: 13 (ref 9–20)
BUN: 9 mg/dL (ref 6–20)
Bilirubin Total: 0.8 mg/dL (ref 0.0–1.2)
CO2: 23 mmol/L (ref 20–29)
Calcium: 9.2 mg/dL (ref 8.7–10.2)
Chloride: 105 mmol/L (ref 96–106)
Creatinine, Ser: 0.7 mg/dL — ABNORMAL LOW (ref 0.76–1.27)
GFR calc Af Amer: 137 mL/min/{1.73_m2} (ref >59)
GFR calc non Af Amer: 119 mL/min/{1.73_m2} (ref >59)
Globulin, Total: 3.2 g/dL (ref 1.5–4.5)
Glucose: 266 mg/dL — ABNORMAL HIGH (ref 65–99)
Potassium: 4.3 mmol/L (ref 3.5–5.2)
Sodium: 139 mmol/L (ref 134–144)
Total Protein: 7.4 g/dL (ref 6.0–8.5)

## 2019-07-27 LAB — CBC WITH DIFFERENTIAL/PLATELET
Basophils Absolute: 0 10*3/uL (ref 0.0–0.2)
Basos: 1 %
EOS (ABSOLUTE): 0.1 10*3/uL (ref 0.0–0.4)
Eos: 2 %
Hematocrit: 47 % (ref 37.5–51.0)
Hemoglobin: 15.9 g/dL (ref 13.0–17.7)
Immature Grans (Abs): 0 10*3/uL (ref 0.0–0.1)
Immature Granulocytes: 0 %
Lymphocytes Absolute: 2.6 10*3/uL (ref 0.7–3.1)
Lymphs: 45 %
MCH: 29.6 pg (ref 26.6–33.0)
MCHC: 33.8 g/dL (ref 31.5–35.7)
MCV: 87 fL (ref 79–97)
Monocytes Absolute: 0.3 10*3/uL (ref 0.1–0.9)
Monocytes: 6 %
Neutrophils Absolute: 2.6 10*3/uL (ref 1.4–7.0)
Neutrophils: 46 %
Platelets: 147 10*3/uL — ABNORMAL LOW (ref 150–450)
RBC: 5.38 x10E6/uL (ref 4.14–5.80)
RDW: 13.1 % (ref 11.6–15.4)
WBC: 5.7 10*3/uL (ref 3.4–10.8)

## 2019-07-27 LAB — LIPID PANEL
Chol/HDL Ratio: 5.9 ratio — ABNORMAL HIGH (ref 0.0–5.0)
Cholesterol, Total: 164 mg/dL (ref 100–199)
HDL: 28 mg/dL — ABNORMAL LOW (ref >39)
LDL Chol Calc (NIH): 100 mg/dL — ABNORMAL HIGH (ref 0–99)
Triglycerides: 207 mg/dL — ABNORMAL HIGH (ref 0–149)
VLDL Cholesterol Cal: 36 mg/dL (ref 5–40)

## 2019-10-26 ENCOUNTER — Telehealth: Payer: Self-pay | Admitting: Family Medicine

## 2019-10-27 ENCOUNTER — Ambulatory Visit: Payer: Self-pay | Admitting: Family Medicine

## 2019-11-19 ENCOUNTER — Other Ambulatory Visit: Payer: Self-pay

## 2019-11-19 DIAGNOSIS — E781 Pure hyperglyceridemia: Secondary | ICD-10-CM

## 2019-11-19 DIAGNOSIS — E1169 Type 2 diabetes mellitus with other specified complication: Secondary | ICD-10-CM

## 2019-11-19 DIAGNOSIS — E119 Type 2 diabetes mellitus without complications: Secondary | ICD-10-CM

## 2019-11-19 MED ORDER — FENOFIBRATE 145 MG PO TABS: 145.0000 mg | ORAL_TABLET | Freq: Every day | ORAL | 0 refills | Status: DC

## 2019-11-19 MED ORDER — SITAGLIP PHOS-METFORMIN HCL ER 50-1000 MG PO TB24: 1.0000 | ORAL_TABLET | Freq: Every day | ORAL | 0 refills | Status: DC

## 2019-11-29 ENCOUNTER — Telehealth: Payer: Self-pay

## 2019-11-29 NOTE — Telephone Encounter (Signed)
Could he afford 62 every 90 days? If so, I can switch him to some medications on the $4 list at [facility]?  Pioglitazone 30mg , $38 for 90 days And  Glyburide/Metformin 5/500mg , $24 for 90 days

## 2019-11-29 NOTE — Telephone Encounter (Signed)
Patient unable to afford diabetic medication. He can get the fenofibrate. He hasn't been taking the glipizide because he thought he was supposed to stop it. Can he be changed to something cheaper? Please advise

## 2019-12-06 NOTE — Telephone Encounter (Signed)
Patient states that he can afford that. Will you go ahead and send to pharmacy. Does he also need to restart the glipizide? Or will this take the place of that?

## 2019-12-08 ENCOUNTER — Other Ambulatory Visit: Payer: Self-pay | Admitting: Family Medicine

## 2019-12-08 DIAGNOSIS — E1169 Type 2 diabetes mellitus with other specified complication: Secondary | ICD-10-CM

## 2019-12-08 DIAGNOSIS — E781 Pure hyperglyceridemia: Secondary | ICD-10-CM

## 2019-12-08 DIAGNOSIS — E119 Type 2 diabetes mellitus without complications: Secondary | ICD-10-CM

## 2019-12-08 MED ORDER — PIOGLITAZONE HCL 30 MG PO TABS: 30.0000 mg | ORAL_TABLET | Freq: Every day | ORAL | 3 refills | Status: DC

## 2019-12-08 MED ORDER — GLYBURIDE-METFORMIN 5-500 MG PO TABS: 1.0000 | ORAL_TABLET | Freq: Every day | ORAL | 3 refills | Status: DC

## 2019-12-08 NOTE — Telephone Encounter (Signed)
This will take the place of that. I will send in for him.

## 2019-12-08 NOTE — Telephone Encounter (Signed)
Patients wife notified

## 2019-12-29 DIAGNOSIS — M25511 Pain in right shoulder: Secondary | ICD-10-CM | POA: Insufficient documentation

## 2020-01-06 ENCOUNTER — Ambulatory Visit: Payer: 59 | Attending: Internal Medicine

## 2020-01-06 DIAGNOSIS — Z23 Encounter for immunization: Secondary | ICD-10-CM

## 2020-01-06 NOTE — Progress Notes (Signed)
   Covid-19 Vaccination Clinic  Name:  Jack Porter    MRN: [id number] DOB: 03-06-1981  01/06/2020  [name] was observed post Covid-19 immunization for 15 minutes without incident. He was provided with Vaccine Information Sheet and instruction to access the V-Safe system.   [name] was instructed to call 911 with any severe reactions post vaccine: Marland Kitchen Difficulty breathing  . Swelling of face and throat  . A fast heartbeat  . A bad rash all over body  . Dizziness and weakness   Immunizations Administered    Name Date Dose VIS Date Route   Moderna COVID-19 Vaccine 01/06/2020  1:00 AM 0.5 mL 09/07/2019 Intramuscular   Manufacturer: Moderna   Lot: [id number]   NDC: 32003-794-44

## 2020-01-20 DIAGNOSIS — S43439A Superior glenoid labrum lesion of unspecified shoulder, initial encounter: Secondary | ICD-10-CM | POA: Insufficient documentation

## 2020-02-03 ENCOUNTER — Ambulatory Visit: Payer: Self-pay | Attending: Internal Medicine

## 2020-02-03 DIAGNOSIS — Z23 Encounter for immunization: Secondary | ICD-10-CM

## 2020-02-03 NOTE — Progress Notes (Signed)
   Covid-19 Vaccination Clinic  Name:  Jack Porter    MRN: [id number] DOB: 1981/02/22  02/03/2020  [name] was observed post Covid-19 immunization for 15 minutes without incident. He was provided with Vaccine Information Sheet and instruction to access the V-Safe system.   [name] was instructed to call 911 with any severe reactions post vaccine: Marland Kitchen Difficulty breathing  . Swelling of face and throat  . A fast heartbeat  . A bad rash all over body  . Dizziness and weakness   Immunizations Administered    Name Date Dose VIS Date Route   Moderna COVID-19 Vaccine 02/03/2020 12:56 PM 0.5 mL 09/2019 Intramuscular   Manufacturer: Moderna   Lot: [id number]   NDC: 07354-301-48

## 2020-02-23 DIAGNOSIS — Z4789 Encounter for other orthopedic aftercare: Secondary | ICD-10-CM | POA: Insufficient documentation

## 2020-04-12 ENCOUNTER — Other Ambulatory Visit: Payer: Self-pay | Admitting: *Deleted

## 2020-04-12 DIAGNOSIS — E1169 Type 2 diabetes mellitus with other specified complication: Secondary | ICD-10-CM

## 2020-04-12 MED ORDER — FENOFIBRATE 145 MG PO TABS: 145.0000 mg | ORAL_TABLET | Freq: Every day | ORAL | 0 refills | Status: DC

## 2020-10-04 ENCOUNTER — Ambulatory Visit: Payer: Self-pay | Admitting: Nurse Practitioner

## 2020-10-11 ENCOUNTER — Ambulatory Visit: Payer: Self-pay | Admitting: Family Medicine

## 2020-10-17 ENCOUNTER — Ambulatory Visit: Payer: Self-pay | Admitting: Nurse Practitioner

## 2020-10-24 ENCOUNTER — Ambulatory Visit: Payer: Self-pay | Admitting: Nurse Practitioner

## 2020-10-27 ENCOUNTER — Ambulatory Visit: Payer: Self-pay | Admitting: Nurse Practitioner

## 2020-11-06 ENCOUNTER — Ambulatory Visit: Payer: Self-pay | Admitting: Family Medicine

## 2020-11-07 ENCOUNTER — Encounter: Payer: Self-pay | Admitting: Family Medicine

## 2020-12-28 ENCOUNTER — Ambulatory Visit (INDEPENDENT_AMBULATORY_CARE_PROVIDER_SITE_OTHER): Payer: Self-pay | Admitting: Family Medicine

## 2020-12-28 ENCOUNTER — Encounter: Payer: Self-pay | Admitting: Family Medicine

## 2020-12-28 ENCOUNTER — Other Ambulatory Visit: Payer: Self-pay

## 2020-12-28 VITALS — BP 128/86 | HR 80 | Temp 97.9°F | Ht 74.0 in | Wt 298.0 lb

## 2020-12-28 DIAGNOSIS — E782 Mixed hyperlipidemia: Secondary | ICD-10-CM | POA: Insufficient documentation

## 2020-12-28 DIAGNOSIS — B353 Tinea pedis: Secondary | ICD-10-CM

## 2020-12-28 DIAGNOSIS — E119 Type 2 diabetes mellitus without complications: Secondary | ICD-10-CM

## 2020-12-28 DIAGNOSIS — M62830 Muscle spasm of back: Secondary | ICD-10-CM

## 2020-12-28 DIAGNOSIS — R300 Dysuria: Secondary | ICD-10-CM

## 2020-12-28 DIAGNOSIS — E669 Obesity, unspecified: Secondary | ICD-10-CM

## 2020-12-28 DIAGNOSIS — R748 Abnormal levels of other serum enzymes: Secondary | ICD-10-CM

## 2020-12-28 DIAGNOSIS — E1169 Type 2 diabetes mellitus with other specified complication: Secondary | ICD-10-CM | POA: Insufficient documentation

## 2020-12-28 LAB — URINALYSIS, COMPLETE
Bilirubin, UA: NEGATIVE
Ketones, UA: NEGATIVE
Leukocytes,UA: NEGATIVE
Nitrite, UA: NEGATIVE
Protein,UA: NEGATIVE
RBC, UA: NEGATIVE
Specific Gravity, UA: >1.03 — ABNORMAL HIGH (ref 1.005–1.030)
Urobilinogen, Ur: >8 mg/dL — ABNORMAL HIGH (ref 0.2–1.0)
pH, UA: 6 (ref 5.0–7.5)

## 2020-12-28 LAB — MICROSCOPIC EXAMINATION
Bacteria, UA: NONE SEEN
RBC, Urine: NONE SEEN /hpf (ref 0–2)
WBC, UA: NONE SEEN /hpf (ref 0–5)

## 2020-12-28 LAB — BAYER DCA HB A1C WAIVED: HB A1C (BAYER DCA - WAIVED): 9.3 % — ABNORMAL HIGH (ref <7.0)

## 2020-12-28 MED ORDER — IGLUCOSE TEST STRIPS VI STRP: ORAL_STRIP | 12 refills | Status: AC

## 2020-12-28 MED ORDER — NYSTATIN 100000 UNIT/GM EX CREA: 1.0000 "application " | TOPICAL_CREAM | Freq: Two times a day (BID) | CUTANEOUS | 0 refills | Status: DC

## 2020-12-28 MED ORDER — PIOGLITAZONE HCL 30 MG PO TABS: 30.0000 mg | ORAL_TABLET | Freq: Every day | ORAL | 3 refills | Status: DC

## 2020-12-28 MED ORDER — EMPAGLIFLOZIN 10 MG PO TABS: 10.0000 mg | ORAL_TABLET | Freq: Every day | ORAL | 1 refills | Status: DC

## 2020-12-28 MED ORDER — GLYBURIDE-METFORMIN 5-500 MG PO TABS: 1.0000 | ORAL_TABLET | Freq: Every day | ORAL | 3 refills | Status: DC

## 2020-12-28 MED ORDER — CYCLOBENZAPRINE HCL 10 MG PO TABS: 10.0000 mg | ORAL_TABLET | Freq: Three times a day (TID) | ORAL | 1 refills | Status: DC | PRN

## 2020-12-28 NOTE — Patient Instructions (Signed)
Diabetes Mellitus and Foot Care Foot care is an important part of your health, especially when you have diabetes. Diabetes may cause you to have problems because of poor blood flow (circulation) to your feet and legs, which can cause your skin to:  Become thinner and drier.  Break more easily.  Heal more slowly.  Peel and crack. You may also have nerve damage (neuropathy) in your legs and feet, causing decreased feeling in them. This means that you may not notice minor injuries to your feet that could lead to more serious problems. Noticing and addressing any potential problems early is the best way to prevent future foot problems. How to care for your feet Foot hygiene  Wash your feet daily with warm water and mild soap. Do not use hot water. Then, pat your feet and the areas between your toes until they are completely dry. Do not soak your feet as this can dry your skin.  Trim your toenails straight across. Do not dig under them or around the cuticle. File the edges of your nails with an emery board or nail file.  Apply a moisturizing lotion or petroleum jelly to the skin on your feet and to dry, brittle toenails. Use lotion that does not contain alcohol and is unscented. Do not apply lotion between your toes.   Shoes and socks  Wear clean socks or stockings every day. Make sure they are not too tight. Do not wear knee-high stockings since they may decrease blood flow to your legs.  Wear shoes that fit properly and have enough cushioning. Always look in your shoes before you put them on to be sure there are no objects inside.  To break in new shoes, wear them for just a few hours a day. This prevents injuries on your feet. Wounds, scrapes, corns, and calluses  Check your feet daily for blisters, cuts, bruises, sores, and redness. If you cannot see the bottom of your feet, use a mirror or ask someone for help.  Do not cut corns or calluses or try to remove them with medicine.  If you  find a minor scrape, cut, or break in the skin on your feet, keep it and the skin around it clean and dry. You may clean these areas with mild soap and water. Do not clean the area with peroxide, alcohol, or iodine.  If you have a wound, scrape, corn, or callus on your foot, look at it several times a day to make sure it is healing and not infected. Check for: ? Redness, swelling, or pain. ? Fluid or blood. ? Warmth. ? Pus or a bad smell.   General tips  Do not cross your legs. This may decrease blood flow to your feet.  Do not use heating pads or hot water bottles on your feet. They may burn your skin. If you have lost feeling in your feet or legs, you may not know this is happening until it is too late.  Protect your feet from hot and cold by wearing shoes, such as at the beach or on hot pavement.  Schedule a complete foot exam at least once a year (annually) or more often if you have foot problems. Report any cuts, sores, or bruises to your health care provider immediately. Where to find more information  American Diabetes Association: www.diabetes.org  Association of Diabetes Care & Education Specialists: www.diabeteseducator.org Contact a health care provider if:  You have a medical condition that increases your risk of infection and   you have any cuts, sores, or bruises on your feet.  You have an injury that is not healing.  You have redness on your legs or feet.  You feel burning or tingling in your legs or feet.  You have pain or cramps in your legs and feet.  Your legs or feet are numb.  Your feet always feel cold.  You have pain around any toenails. Get help right away if:  You have a wound, scrape, corn, or callus on your foot and: ? You have pain, swelling, or redness that gets worse. ? You have fluid or blood coming from the wound, scrape, corn, or callus. ? Your wound, scrape, corn, or callus feels warm to the touch. ? You have pus or a bad smell coming from  the wound, scrape, corn, or callus. ? You have a fever. ? You have a red line going up your leg. Summary  Check your feet every day for blisters, cuts, bruises, sores, and redness.  Apply a moisturizing lotion or petroleum jelly to the skin on your feet and to dry, brittle toenails.  Wear shoes that fit properly and have enough cushioning.  If you have foot problems, report any cuts, sores, or bruises to your health care provider immediately.  Schedule a complete foot exam at least once a year (annually) or more often if you have foot problems. This information is not intended to replace advice given to you by your health care provider. Make sure you discuss any questions you have with your health care provider. Document Revised: 04/13/2020 Document Reviewed: 04/13/2020 Elsevier Patient Education  2021 Elsevier Inc.  

## 2020-12-28 NOTE — Progress Notes (Signed)
Established Patient Office Visit  Subjective:  Patient ID: Jack Porter, male    DOB: [birth year]  Age: 40 y.o. MRN: [id number]  CC:  Chief Complaint  Patient presents with  . Diabetes    HPI Jack Porter presents for chronic follow up.  1. T2DM Current symptoms include paresthesia of the feet. Patient denies foot ulcerations, nausea, visual disturbances, vomiting and weight loss.  Current diabetic medications include: glucovance and actos, has been out for a few weeks Compliant with meds - yes  Current monitoring regimen: home blood tests - daily Home blood sugar records: fasting range: 100, reports 180-190 in afternnon Any episodes of hypoglycemia? no  Known diabetic complications: peripheral neuropathy Eye exam current (within one year): no Weight trend: stable Current diet: regular, he has cut out sodas, and is working on portion control Current exercise: active at work  PNA Vaccine UTD?  No declines Hep B Vaccine?  No declines Tdap Vaccine UTD?  No declines Urine microalbumin UTD? Done today  Is He on ACE inhibitor or angiotensin II receptor blocker?  No Is He on statin? No, elevated liver enzymes  2. Low back left side Gottlieb reports back pain on his left side for about 10 days. It is intermittent, sharp, and feels like a spasm. It is worse at work and when laying down on that side. He did some have some dysuria with dark yellow urine a few days ago. Denies abdominal pain, vomiting, fever, chills, or swelling. Denies injury.    Past Medical History:  Diagnosis Date  . Diabetes mellitus without complication ([place])   . GERD (gastroesophageal reflux disease)   . H/O hiatal hernia     Past Surgical History:  Procedure Laterality Date  . CHOLECYSTECTOMY  01/21/2014  . CHOLECYSTECTOMY N/A 01/21/2014   Procedure: LAPAROSCOPIC CHOLECYSTECTOMY;  Surgeon: Gayland Curry, MD;  Location: [facility];  Service: General;  Laterality: N/A;    Family History  Problem  Relation Age of Onset  . Diabetes Mother   . Heart disease Sister   . Diabetes Sister     Social History   Socioeconomic History  . Marital status: Married    Spouse name: Not on file  . Number of children: Not on file  . Years of education: Not on file  . Highest education level: Not on file  Occupational History  . Not on file  Tobacco Use  . Smoking status: Former Smoker    Packs/day: 0.25    Years: 3.00    Pack years: 0.75    Types: Cigarettes  . Smokeless tobacco: Never Used  Vaping Use  . Vaping Use: Never used  Substance and Sexual Activity  . Alcohol use: No    Comment: rarely  . Drug use: No  . Sexual activity: Yes  Other Topics Concern  . Not on file  Social History Narrative  . Not on file   Social Determinants of Health   Financial Resource Strain: Not on file  Food Insecurity: Not on file  Transportation Needs: Not on file  Physical Activity: Not on file  Stress: Not on file  Social Connections: Not on file  Intimate Partner Violence: Not on file    Outpatient Medications Prior to Visit  Medication Sig Dispense Refill  . blood glucose meter kit and supplies KIT Dispense based on patient and insurance preference. Use up to four times daily as directed. (FOR ICD-9 250.00, 250.01). (Patient not taking: Reported on 12/28/2020) 1 each 0  .  fenofibrate (TRICOR) 145 MG tablet Take 1 tablet (145 mg total) by mouth daily. (Needs to be seen before next refill) (Patient not taking: Reported on 12/28/2020) 30 tablet 0  . glyBURIDE-metformin (GLUCOVANCE) 5-500 MG tablet Take 1 tablet by mouth daily with breakfast. 90 tablet 3  . pioglitazone (ACTOS) 30 MG tablet Take 1 tablet (30 mg total) by mouth daily. 90 tablet 3   No facility-administered medications prior to visit.    No Known Allergies  ROS Review of Systems As per HPI.     Objective:    Physical Exam Vitals and nursing note reviewed.  Constitutional:      General: He is not in acute distress.     Appearance: He is obese. He is not ill-appearing, toxic-appearing or diaphoretic.  HENT:     Head: Normocephalic and atraumatic.     Nose: Nose normal.     Mouth/Throat:     Mouth: Mucous membranes are moist.     Pharynx: Oropharynx is clear. No posterior oropharyngeal erythema.  Eyes:     Extraocular Movements: Extraocular movements intact.     Pupils: Pupils are equal, round, and reactive to light.  Cardiovascular:     Rate and Rhythm: Normal rate and regular rhythm.     Heart sounds: Normal heart sounds. No murmur heard.   Pulmonary:     Effort: Pulmonary effort is normal. No respiratory distress.  Abdominal:     Palpations: Abdomen is soft.     Tenderness: There is no right CVA tenderness, left CVA tenderness, guarding or rebound.  Musculoskeletal:     Cervical back: Neck supple. No tenderness.     Right lower leg: No edema.     Left lower leg: No edema.  Skin:    General: Skin is warm and dry.     Findings: No erythema.  Neurological:     General: No focal deficit present.     Mental Status: He is alert and oriented to person, place, and time.  Psychiatric:        Mood and Affect: Mood normal.        Behavior: Behavior normal.        Thought Content: Thought content normal.        Judgment: Judgment normal.    BP 128/86   Pulse 80   Temp 97.9 F (36.6 C) (Temporal)   Ht _0  (1.88 m)   Wt 298 lb (135.2 kg)   BMI 38.26 kg/m  Wt Readings from Last 3 Encounters:  12/28/20 298 lb (135.2 kg)  07/26/19 295 lb (133.8 kg)  12/14/18 294 lb (133.4 kg)   Diabetic Foot Exam - Simple   Simple Foot Form Diabetic Foot exam was performed with the following findings: Yes 12/28/2020 11:20 AM  Visual Inspection See comments: Yes Sensation Testing Intact to touch and monofilament testing bilaterally: Yes Pulse Check Posterior Tibialis and Dorsalis pulse intact bilaterally: Yes Comments Tinea pedis between left 3rd and 4th toes    Urine dipstick shows positive for  glucose and positive for urobilinogen.  Micro exam: 0 WBC's per HPF, 0 RBC's per HPF and no bacteria.   Health Maintenance Due  Topic Date Due  . OPHTHALMOLOGY EXAM  Never done  . FOOT EXAM  11/03/2019  . URINE MICROALBUMIN  11/03/2019  . HEMOGLOBIN A1C  01/24/2020    There are no preventive care reminders to display for this patient.  Lab Results  Component Value Date   TSH 1.880 11/02/2018  Lab Results  Component Value Date   WBC 5.7 07/26/2019   HGB 15.9 07/26/2019   HCT 47.0 07/26/2019   MCV 87 07/26/2019   PLT 147 (L) 07/26/2019   Lab Results  Component Value Date   NA 139 07/26/2019   K 4.3 07/26/2019   CO2 23 07/26/2019   GLUCOSE 266 (H) 07/26/2019   BUN 9 07/26/2019   CREATININE 0.70 (L) 07/26/2019   BILITOT 0.8 07/26/2019   ALKPHOS 132 (H) 07/26/2019   AST 63 (H) 07/26/2019   ALT 75 (H) 07/26/2019   PROT 7.4 07/26/2019   ALBUMIN 4.2 07/26/2019   CALCIUM 9.2 07/26/2019   Lab Results  Component Value Date   CHOL 164 07/26/2019   Lab Results  Component Value Date   HDL 28 (L) 07/26/2019   Lab Results  Component Value Date   LDLCALC 100 (H) 07/26/2019   Lab Results  Component Value Date   TRIG 207 (H) 07/26/2019   Lab Results  Component Value Date   CHOLHDL 5.9 (H) 07/26/2019   Lab Results  Component Value Date   HGBA1C 10.6 (H) 07/26/2019      Assessment & Plan:   Jack Porter was seen today for diabetes.  Diagnoses and all orders for this visit:  Type 2 diabetes mellitus with hypertriglyceridemia ([place]) Uncontrolled. A1c 9.3 today. Continue Actos and Glucovance. Samples of Jardiance given for 6 weeks. Patient does not insurance. Labs pending as below. Schedule appointment with Almyra Free in 4 weeks to discuss assistance with medications.  -     Microalbumin / creatinine urine ratio -     CBC with Differential/Platelet -     CMP14+EGFR -     Lipid panel -     Bayer DCA Hb A1c Waived -     glyBURIDE-metformin (GLUCOVANCE) 5-500 MG tablet; Take 1  tablet by mouth daily with breakfast. -     pioglitazone (ACTOS) 30 MG tablet; Take 1 tablet (30 mg total) by mouth daily. -     glucose blood (IGLUCOSE TEST STRIPS) test strip; Use as instructed -     empagliflozin (JARDIANCE) 10 MG TABS tablet; Take 1 tablet (10 mg total) by mouth daily before breakfast.  Obesity (BMI 30-39.9) Labs pending as below. Diet and exercise.  -     CBC with Differential/Platelet -     CMP14+EGFR -     Lipid panel  Mixed hyperlipidemia Not currently taking fenofibrate. Labs pending as below.  -     CMP14+EGFR -     Lipid panel  Elevated liver enzymes Labs as below.  -     CMP14+EGFR  Dysuria Resolved. UA negative for UTI.  -     Urinalysis, Complete  Tinea pedis of left foot Nystatin as below.  -     nystatin cream (MYCOSTATIN); Apply 1 application topically 2 (two) times daily.  Muscle spasm of back -     cyclobenzaprine (FLEXERIL) 10 MG tablet; Take 1 tablet (10 mg total) by mouth 3 (three) times daily as needed for muscle spasms.  Follow-up: Return in about 1 month (around 01/29/2021) for with Almyra Free with DM patient assistance.   The patient indicates understanding of these issues and agrees with the plan.  Gwenlyn Perking, FNP

## 2020-12-29 LAB — CMP14+EGFR
ALT: 41 IU/L (ref 0–44)
AST: 39 IU/L (ref 0–40)
Albumin/Globulin Ratio: 1.4 (ref 1.2–2.2)
Albumin: 4.1 g/dL (ref 4.0–5.0)
Alkaline Phosphatase: 106 IU/L (ref 44–121)
BUN/Creatinine Ratio: 18 (ref 9–20)
BUN: 11 mg/dL (ref 6–24)
Bilirubin Total: 1.6 mg/dL — ABNORMAL HIGH (ref 0.0–1.2)
CO2: 23 mmol/L (ref 20–29)
Calcium: 9.2 mg/dL (ref 8.7–10.2)
Chloride: 105 mmol/L (ref 96–106)
Creatinine, Ser: 0.62 mg/dL — ABNORMAL LOW (ref 0.76–1.27)
Globulin, Total: 3 g/dL (ref 1.5–4.5)
Glucose: 173 mg/dL — ABNORMAL HIGH (ref 65–99)
Potassium: 4.3 mmol/L (ref 3.5–5.2)
Sodium: 144 mmol/L (ref 134–144)
Total Protein: 7.1 g/dL (ref 6.0–8.5)
eGFR: 124 mL/min/{1.73_m2} (ref >59)

## 2020-12-29 LAB — MICROALBUMIN / CREATININE URINE RATIO
Creatinine, Urine: 161.9 mg/dL
Microalb/Creat Ratio: 14 mg/g creat (ref 0–29)
Microalbumin, Urine: 23.3 ug/mL

## 2020-12-29 LAB — LIPID PANEL
Chol/HDL Ratio: 5.1 ratio — ABNORMAL HIGH (ref 0.0–5.0)
Cholesterol, Total: 154 mg/dL (ref 100–199)
HDL: 30 mg/dL — ABNORMAL LOW (ref >39)
LDL Chol Calc (NIH): 106 mg/dL — ABNORMAL HIGH (ref 0–99)
Triglycerides: 97 mg/dL (ref 0–149)
VLDL Cholesterol Cal: 18 mg/dL (ref 5–40)

## 2020-12-29 LAB — CBC WITH DIFFERENTIAL/PLATELET
Basophils Absolute: 0 10*3/uL (ref 0.0–0.2)
Basos: 1 %
EOS (ABSOLUTE): 0.2 10*3/uL (ref 0.0–0.4)
Eos: 3 %
Hematocrit: 47.1 % (ref 37.5–51.0)
Hemoglobin: 16 g/dL (ref 13.0–17.7)
Immature Grans (Abs): 0 10*3/uL (ref 0.0–0.1)
Immature Granulocytes: 0 %
Lymphocytes Absolute: 2.7 10*3/uL (ref 0.7–3.1)
Lymphs: 46 %
MCH: 29.6 pg (ref 26.6–33.0)
MCHC: 34 g/dL (ref 31.5–35.7)
MCV: 87 fL (ref 79–97)
Monocytes Absolute: 0.4 10*3/uL (ref 0.1–0.9)
Monocytes: 7 %
Neutrophils Absolute: 2.5 10*3/uL (ref 1.4–7.0)
Neutrophils: 43 %
Platelets: 142 10*3/uL — ABNORMAL LOW (ref 150–450)
RBC: 5.41 x10E6/uL (ref 4.14–5.80)
RDW: 13.5 % (ref 11.6–15.4)
WBC: 5.9 10*3/uL (ref 3.4–10.8)

## 2021-01-01 ENCOUNTER — Other Ambulatory Visit: Payer: Self-pay | Admitting: Family Medicine

## 2021-01-01 DIAGNOSIS — R17 Unspecified jaundice: Secondary | ICD-10-CM

## 2021-01-01 DIAGNOSIS — E785 Hyperlipidemia, unspecified: Secondary | ICD-10-CM

## 2021-01-01 DIAGNOSIS — E1169 Type 2 diabetes mellitus with other specified complication: Secondary | ICD-10-CM

## 2021-01-01 MED ORDER — ATORVASTATIN CALCIUM 20 MG PO TABS: 20.0000 mg | ORAL_TABLET | Freq: Every day | ORAL | 3 refills | Status: DC

## 2021-01-30 ENCOUNTER — Encounter: Payer: Self-pay | Admitting: Pharmacist

## 2021-01-30 NOTE — Progress Notes (Signed)
No show  This encounter was created in error - please disregard.

## 2021-02-02 DIAGNOSIS — Z23 Encounter for immunization: Secondary | ICD-10-CM | POA: Diagnosis not present

## 2021-02-20 ENCOUNTER — Ambulatory Visit (INDEPENDENT_AMBULATORY_CARE_PROVIDER_SITE_OTHER): Payer: Self-pay | Admitting: Nurse Practitioner

## 2021-02-20 ENCOUNTER — Other Ambulatory Visit: Payer: Self-pay

## 2021-02-20 ENCOUNTER — Encounter: Payer: Self-pay | Admitting: Nurse Practitioner

## 2021-02-20 VITALS — BP 119/83 | HR 82 | Temp 98.1°F | Ht 74.0 in | Wt 296.0 lb

## 2021-02-20 DIAGNOSIS — M79671 Pain in right foot: Secondary | ICD-10-CM

## 2021-02-20 MED ORDER — IBUPROFEN 600 MG PO TABS: 600.0000 mg | ORAL_TABLET | Freq: Three times a day (TID) | ORAL | 0 refills | Status: DC | PRN

## 2021-02-20 MED ORDER — PREDNISONE 10 MG (21) PO TBPK: ORAL_TABLET | ORAL | 0 refills | Status: DC

## 2021-02-20 NOTE — Progress Notes (Signed)
Acute Office Visit  Subjective:    Patient ID: Jack Porter, male    DOB: 04-20-1981, 40 y.o.   MRN: [id number]  Chief Complaint  Patient presents with  . Foot Pain    HPI Patient is in today for Pain  He reports new onset right foot (planter fascia pain. was not an injury that may have caused the pain.  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  The pain started a few weeks ago and is worsening. The pain does radiate from heel to metatarsal. The pain is described as aching, sharp and soreness, is severe and 8/10 in intensity, occurring intermittently. Symptoms are worse in the: walking or standing  Aggravating factors: walking Relieving factors: sitting.  He has tried acetaminophen with little relief.   ---------------------------------------------------------------------------------------------------   Past Medical History:  Diagnosis Date  . Diabetes mellitus without complication ([place])   . GERD (gastroesophageal reflux disease)   . H/O hiatal hernia     Past Surgical History:  Procedure Laterality Date  . CHOLECYSTECTOMY  01/21/2014  . CHOLECYSTECTOMY N/A 01/21/2014   Procedure: LAPAROSCOPIC CHOLECYSTECTOMY;  Surgeon: Gayland Curry, MD;  Location: [facility];  Service: General;  Laterality: N/A;    Family History  Problem Relation Age of Onset  . Diabetes Mother   . Heart disease Sister   . Diabetes Sister     Social History   Socioeconomic History  . Marital status: Married    Spouse name: Not on file  . Number of children: Not on file  . Years of education: Not on file  . Highest education level: Not on file  Occupational History  . Not on file  Tobacco Use  . Smoking status: Former Smoker    Packs/day: 0.25    Years: 3.00    Pack years: 0.75    Types: Cigarettes  . Smokeless tobacco: Never Used  Vaping Use  . Vaping Use: Never used  Substance and Sexual Activity  . Alcohol use: No    Comment: rarely  . Drug use: No  . Sexual activity:  Yes  Other Topics Concern  . Not on file  Social History Narrative  . Not on file   Social Determinants of Health   Financial Resource Strain: Not on file  Food Insecurity: Not on file  Transportation Needs: Not on file  Physical Activity: Not on file  Stress: Not on file  Social Connections: Not on file  Intimate Partner Violence: Not on file    Outpatient Medications Prior to Visit  Medication Sig Dispense Refill  . atorvastatin (LIPITOR) 20 MG tablet Take 1 tablet (20 mg total) by mouth daily. 90 tablet 3  . blood glucose meter kit and supplies KIT Dispense based on patient and insurance preference. Use up to four times daily as directed. (FOR ICD-9 250.00, 250.01). 1 each 0  . cyclobenzaprine (FLEXERIL) 10 MG tablet Take 1 tablet (10 mg total) by mouth 3 (three) times daily as needed for muscle spasms. 30 tablet 1  . empagliflozin (JARDIANCE) 10 MG TABS tablet Take 1 tablet (10 mg total) by mouth daily before breakfast. 30 tablet 1  . glucose blood (IGLUCOSE TEST STRIPS) test strip Use as instructed 100 each 12  . glyBURIDE-metformin (GLUCOVANCE) 5-500 MG tablet Take 1 tablet by mouth daily with breakfast. 90 tablet 3  . nystatin cream (MYCOSTATIN) Apply 1 application topically 2 (two) times daily. 30 g 0  . pioglitazone (ACTOS) 30 MG tablet Take  1 tablet (30 mg total) by mouth daily. 90 tablet 3   No facility-administered medications prior to visit.    No Known Allergies  Review of Systems  Constitutional: Negative.   HENT: Negative.   Eyes: Negative.   Respiratory: Negative.   Gastrointestinal: Negative.   Genitourinary: Negative.   All other systems reviewed and are negative.      Objective:    Physical Exam Vitals and nursing note reviewed.  Constitutional:      Appearance: Normal appearance.  HENT:     Head: Normocephalic.     Nose: Nose normal.  Cardiovascular:     Rate and Rhythm: Normal rate and regular rhythm.     Pulses: Normal pulses.     Heart  sounds: Normal heart sounds.  Pulmonary:     Breath sounds: Normal breath sounds.  Abdominal:     General: Bowel sounds are normal.  Musculoskeletal:     Right foot: Decreased range of motion. Tenderness present. Normal pulse.  Neurological:     Mental Status: He is alert and oriented to person, place, and time.  Psychiatric:        Behavior: Behavior normal.     BP 119/83   Pulse 82   Temp 98.1 F (36.7 C) (Temporal)   Ht _0  (1.88 m)   Wt 296 lb (134.3 kg)   SpO2 98%   BMI 38.00 kg/m  Wt Readings from Last 3 Encounters:  02/20/21 296 lb (134.3 kg)  12/28/20 298 lb (135.2 kg)  07/26/19 295 lb (133.8 kg)    Health Maintenance Due  Topic Date Due  . OPHTHALMOLOGY EXAM  Never done  . COVID-19 Vaccine (3 - Booster for Moderna series) 07/05/2020    There are no preventive care reminders to display for this patient.   Lab Results  Component Value Date   TSH 1.880 11/02/2018   Lab Results  Component Value Date   WBC 5.9 12/28/2020   HGB 16.0 12/28/2020   HCT 47.1 12/28/2020   MCV 87 12/28/2020   PLT 142 (L) 12/28/2020   Lab Results  Component Value Date   NA 144 12/28/2020   K 4.3 12/28/2020   CO2 23 12/28/2020   GLUCOSE 173 (H) 12/28/2020   BUN 11 12/28/2020   CREATININE 0.62 (L) 12/28/2020   BILITOT 1.6 (H) 12/28/2020   ALKPHOS 106 12/28/2020   AST 39 12/28/2020   ALT 41 12/28/2020   PROT 7.1 12/28/2020   ALBUMIN 4.1 12/28/2020   CALCIUM 9.2 12/28/2020   EGFR 124 12/28/2020   Lab Results  Component Value Date   CHOL 154 12/28/2020   Lab Results  Component Value Date   HDL 30 (L) 12/28/2020   Lab Results  Component Value Date   LDLCALC 106 (H) 12/28/2020   Lab Results  Component Value Date   TRIG 97 12/28/2020   Lab Results  Component Value Date   CHOLHDL 5.1 (H) 12/28/2020   Lab Results  Component Value Date   HGBA1C 9.3 (H) 12/28/2020       Assessment & Plan:   Problem List Items Addressed This Visit      Other   Right  foot pain - Primary    Worsening pain of the plantar fascia radiating to metatarsal on the right foot.  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  Advised patient to rest foot, apply ice, elevate, ibuprofen as tolerated.  A prednisone pack and follow-up with  worsening or unresolved symptoms.  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  Orthopedic assessment in the future will be helpful.      Relevant Medications   predniSONE (STERAPRED UNI-PAK 21 TAB) 10 MG (21) TBPK tablet   ibuprofen (ADVIL) 600 MG tablet       Meds ordered this encounter  Medications  . predniSONE (STERAPRED UNI-PAK 21 TAB) 10 MG (21) TBPK tablet    Sig: 6 tablets day 1, 5 tablet day 2, 4 tablet day 3, 3 tablets day before, 2 tablets daily 5, 1 tablet day 6.    Dispense:  1 each    Refill:  0    Order Specific Question:   Supervising Provider    Answer:   Janora Norlander [[id number]]  . ibuprofen (ADVIL) 600 MG tablet    Sig: Take 1 tablet (600 mg total) by mouth every 8 (eight) hours as needed.    Dispense:  30 tablet    Refill:  0    Order Specific Question:   Supervising Provider    Answer:   Janora Norlander [[id number]]     Ivy Lynn, NP

## 2021-02-20 NOTE — Assessment & Plan Note (Signed)
Worsening pain of the plantar fascia radiating to metatarsal on the right foot.  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  Advised patient to rest foot, apply ice, elevate, ibuprofen as tolerated.  A prednisone pack and follow-up with worsening or unresolved symptoms.  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  Orthopedic assessment in the future will be helpful.

## 2021-02-20 NOTE — Patient Instructions (Signed)

## 2021-04-17 DIAGNOSIS — Z6841 Body Mass Index (BMI) 40.0 and over, adult: Secondary | ICD-10-CM | POA: Diagnosis not present

## 2021-04-17 DIAGNOSIS — R059 Cough, unspecified: Secondary | ICD-10-CM | POA: Diagnosis not present

## 2021-04-17 DIAGNOSIS — J020 Streptococcal pharyngitis: Secondary | ICD-10-CM | POA: Diagnosis not present

## 2021-04-23 ENCOUNTER — Other Ambulatory Visit: Payer: Self-pay

## 2021-04-23 ENCOUNTER — Encounter: Payer: Self-pay | Admitting: Nurse Practitioner

## 2021-04-23 ENCOUNTER — Ambulatory Visit (INDEPENDENT_AMBULATORY_CARE_PROVIDER_SITE_OTHER): Payer: BC Managed Care – PPO | Admitting: Nurse Practitioner

## 2021-04-23 VITALS — BP 109/70 | HR 78 | Temp 97.6°F | Ht 74.0 in | Wt 301.0 lb

## 2021-04-23 DIAGNOSIS — E781 Pure hyperglyceridemia: Secondary | ICD-10-CM

## 2021-04-23 DIAGNOSIS — E782 Mixed hyperlipidemia: Secondary | ICD-10-CM | POA: Diagnosis not present

## 2021-04-23 DIAGNOSIS — E1169 Type 2 diabetes mellitus with other specified complication: Secondary | ICD-10-CM | POA: Diagnosis not present

## 2021-04-23 DIAGNOSIS — E669 Obesity, unspecified: Secondary | ICD-10-CM

## 2021-04-23 NOTE — Assessment & Plan Note (Signed)
Completed Lipid panel labs, results pending.

## 2021-04-23 NOTE — Patient Instructions (Signed)
Calorie Counting for Weight Loss Calories are units of energy. Your body needs a certain number of calories from food to keep going throughout the day. When you eat or drink more calories than your body needs, your body stores the extra calories mostly as fat. When you eat or drink fewer calories than your body needs, your body burns fat to getthe energy it needs. Calorie counting means keeping track of how many calories you eat and drink each day. Calorie counting can be helpful if you need to lose weight. If you eat fewer calories than your body needs, you should lose weight. Ask yourhealth care provider what a healthy weight is for you. For calorie counting to work, you will need to eat the right number of calories each day to lose a healthy amount of weight per week. A dietitian can help you figure out how many calories you need in a day and will suggest ways to reach your calorie goal. A healthy amount of weight to lose each week is usually 1-2 lb (0.5-0.9 kg). This usually means that your daily calorie intake should be reduced by 500-750 calories. Eating 1,200-1,500 calories a day can help most women lose weight. Eating 1,500-1,800 calories a day can help most men lose weight. What do I need to know about calorie counting? Work with your health care provider or dietitian to determine how many calories you should get each day. To meet your daily calorie goal, you will need to: Find out how many calories are in each food that you would like to eat. Try to do this before you eat. Decide how much of the food you plan to eat. Keep a food log. Do this by writing down what you ate and how many calories it had. To successfully lose weight, it is important to balance calorie counting with ahealthy lifestyle that includes regular activity. Where do I find calorie information?  The number of calories in a food can be found on a Nutrition Facts label. If a food does not have a Nutrition Facts label, try to  look up the calories onlineor ask your dietitian for help. Remember that calories are listed per serving. If you choose to have more than one serving of a food, you will have to multiply the calories per serving by the number of servings you plan to eat. For example, the label on a package of bread might say that a serving size is 1 slice and that there are 90 calories in a serving. If you eat 1 slice, you will have eaten 90 calories. If you eat 2slices, you will have eaten 180 calories. How do I keep a food log? After each time that you eat, record the following in your food log as soon as possible: What you ate. Be sure to include toppings, sauces, and other extras on the food. How much you ate. This can be measured in cups, ounces, or number of items. How many calories were in each food and drink. The total number of calories in the food you ate. Keep your food log near you, such as in a pocket-sized notebook or on an app or website on your mobile phone. Some programs will calculate calories for you andshow you how many calories you have left to meet your daily goal. What are some portion-control tips? Know how many calories are in a serving. This will help you know how many servings you can have of a certain food. Use a measuring cup to   measure serving sizes. You could also try weighing out portions on a kitchen scale. With time, you will be able to estimate serving sizes for some foods. Take time to put servings of different foods on your favorite plates or in your favorite bowls and cups so you know what a serving looks like. Try not to eat straight from a food's packaging, such as from a bag or box. Eating straight from the package makes it hard to see how much you are eating and can lead to overeating. Put the amount you would like to eat in a cup or on a plate to make sure you are eating the right portion. Use smaller plates, glasses, and bowls for smaller portions and to prevent  overeating. Try not to multitask. For example, avoid watching TV or using your computer while eating. If it is time to eat, sit down at a table and enjoy your food. This will help you recognize when you are full. It will also help you be more mindful of what and how much you are eating. What are tips for following this plan? Reading food labels Check the calorie count compared with the serving size. The serving size may be smaller than what you are used to eating. Check the source of the calories. Try to choose foods that are high in protein, fiber, and vitamins, and low in saturated fat, trans fat, and sodium. Shopping Read nutrition labels while you shop. This will help you make healthy decisions about which foods to buy. Pay attention to nutrition labels for low-fat or fat-free foods. These foods sometimes have the same number of calories or more calories than the full-fat versions. They also often have added sugar, starch, or salt to make up for flavor that was removed with the fat. Make a grocery list of lower-calorie foods and stick to it. Cooking Try to cook your favorite foods in a healthier way. For example, try baking instead of frying. Use low-fat dairy products. Meal planning Use more fruits and vegetables. One-half of your plate should be fruits and vegetables. Include lean proteins, such as chicken, turkey, and fish. Lifestyle Each week, aim to do one of the following: 150 minutes of moderate exercise, such as walking. 75 minutes of vigorous exercise, such as running. General information Know how many calories are in the foods you eat most often. This will help you calculate calorie counts faster. Find a way of tracking calories that works for you. Get creative. Try different apps or programs if writing down calories does not work for you. What foods should I eat?  Eat nutritious foods. It is better to have a nutritious, high-calorie food, such as an avocado, than a food with  few nutrients, such as a bag of potato chips. Use your calories on foods and drinks that will fill you up and will not leave you hungry soon after eating. Examples of foods that fill you up are nuts and nut butters, vegetables, lean proteins, and high-fiber foods such as whole grains. High-fiber foods are foods with more than 5 g of fiber per serving. Pay attention to calories in drinks. Low-calorie drinks include water and unsweetened drinks. The items listed above may not be a complete list of foods and beverages you can eat. Contact a dietitian for more information. What foods should I limit? Limit foods or drinks that are not good sources of vitamins, minerals, or protein or that are high in unhealthy fats. These include: Candy. Other sweets. Sodas, specialty   coffee drinks, alcohol, and juice. The items listed above may not be a complete list of foods and beverages you should avoid. Contact a dietitian for more information. How do I count calories when eating out? Pay attention to portions. Often, portions are much larger when eating out. Try these tips to keep portions smaller: Consider sharing a meal instead of getting your own. If you get your own meal, eat only half of it. Before you start eating, ask for a container and put half of your meal into it. When available, consider ordering smaller portions from the menu instead of full portions. Pay attention to your food and drink choices. Knowing the way food is cooked and what is included with the meal can help you eat fewer calories. If calories are listed on the menu, choose the lower-calorie options. Choose dishes that include vegetables, fruits, whole grains, low-fat dairy products, and lean proteins. Choose items that are boiled, broiled, grilled, or steamed. Avoid items that are buttered, battered, fried, or served with cream sauce. Items labeled as crispy are usually fried, unless stated otherwise. Choose water, low-fat milk,  unsweetened iced tea, or other drinks without added sugar. If you want an alcoholic beverage, choose a lower-calorie option, such as a glass of wine or light beer. Ask for dressings, sauces, and syrups on the side. These are usually high in calories, so you should limit the amount you eat. If you want a salad, choose a garden salad and ask for grilled meats. Avoid extra toppings such as bacon, cheese, or fried items. Ask for the dressing on the side, or ask for olive oil and vinegar or lemon to use as dressing. Estimate how many servings of a food you are given. Knowing serving sizes will help you be aware of how much food you are eating at restaurants. Where to find more information Centers for Disease Control and Prevention: FootballExhibition.com.br [place] Department of Agriculture: WrestlingReporter.dk Summary Calorie counting means keeping track of how many calories you eat and drink each day. If you eat fewer calories than your body needs, you should lose weight. A healthy amount of weight to lose per week is usually 1-2 lb (0.5-0.9 kg). This usually means reducing your daily calorie intake by 500-750 calories. The number of calories in a food can be found on a Nutrition Facts label. If a food does not have a Nutrition Facts label, try to look up the calories online or ask your dietitian for help. Use smaller plates, glasses, and bowls for smaller portions and to prevent overeating. Use your calories on foods and drinks that will fill you up and not leave you hungry shortly after a meal. This information is not intended to replace advice given to you by your health care provider. Make sure you discuss any questions you have with your healthcare provider. Document Revised: 11/04/2019 Document Reviewed: 11/04/2019 Elsevier Patient Education  2022 [employer] High Cholesterol  High cholesterol is a condition in which the blood has high levels of a white, waxy substance similar to fat (cholesterol). The liver makes  all the cholesterol that the body needs. The human body needs small amounts of cholesterol to help build cells. A person gets extra orexcess cholesterol from the food that he or she eats. The blood carries cholesterol from the liver to the rest of the body. If you have high cholesterol, deposits (plaques) may build up on the walls of your arteries. Arteries are the blood vessels that carry blood away from your  heart. These plaques make the arteries narrowand stiff. Cholesterol plaques increase your risk for heart attack and stroke. Work withyour health care provider to keep your cholesterol levels in a healthy range. What increases the risk? The following factors may make you more likely to develop this condition: Eating foods that are high in animal fat (saturated fat) or cholesterol. Being overweight. Not getting enough exercise. A family history of high cholesterol (familial hypercholesterolemia). Use of tobacco products. Having diabetes. What are the signs or symptoms? There are no symptoms of this condition. How is this diagnosed? This condition may be diagnosed based on the results of a blood test. If you are older than 41 years of age, your health care provider may check your cholesterol levels every 4-6 years. You may be checked more often if you have high cholesterol or other risk factors for heart disease. The blood test for cholesterol measures: "Bad" cholesterol, or LDL cholesterol. This is the main type of cholesterol that causes heart disease. The desired level is less than 100 mg/dL. "Good" cholesterol, or HDL cholesterol. HDL helps protect against heart disease by cleaning the arteries and carrying the LDL to the liver for processing. The desired level for HDL is 60 mg/dL or higher. Triglycerides. These are fats that your body can store or burn for energy. The desired level is less than 150 mg/dL. Total cholesterol. This measures the total amount of cholesterol in your blood and  includes LDL, HDL, and triglycerides. The desired level is less than 200 mg/dL. How is this treated? This condition may be treated with: Diet changes. You may be asked to eat foods that have more fiber and less saturated fats or added sugar. Lifestyle changes. These may include regular exercise, maintaining a healthy weight, and quitting use of tobacco products. Medicines. These are given when diet and lifestyle changes have not worked. You may be prescribed a statin medicine to help lower your cholesterol levels. Follow these instructions at home: Eating and drinking  Eat a healthy, balanced diet. This diet includes: Daily servings of a variety of fresh, frozen, or canned fruits and vegetables. Daily servings of whole grain foods that are rich in fiber. Foods that are low in saturated fats and trans fats. These include poultry and fish without skin, lean cuts of meat, and low-fat dairy products. A variety of fish, especially oily fish that contain omega-3 fatty acids. Aim to eat fish at least 2 times a week. Avoid foods and drinks that have added sugar. Use healthy cooking methods, such as roasting, grilling, broiling, baking, poaching, steaming, and stir-frying. Do not fry your food except for stir-frying.  Lifestyle  Get regular exercise. Aim to exercise for a total of 150 minutes a week. Increase your activity level by doing activities such as gardening, walking, and taking the stairs. Do not use any products that contain nicotine or tobacco, such as cigarettes, e-cigarettes, and chewing tobacco. If you need help quitting, ask your health care provider.  General instructions Take over-the-counter and prescription medicines only as told by your health care provider. Keep all follow-up visits as told by your health care provider. This is important. Where to find more information American Heart Association: www.heart.org National Heart, Lung, and Blood Institute:  PopSteam.is Contact a health care provider if: You have trouble achieving or maintaining a healthy diet or weight. You are starting an exercise program. You are unable to stop smoking. Get help right away if: You have chest pain. You have trouble breathing. You  have any symptoms of a stroke. "BE FAST" is an easy way to remember the main warning signs of a stroke: B - Balance. Signs are dizziness, sudden trouble walking, or loss of balance. E - Eyes. Signs are trouble seeing or a sudden change in vision. F - Face. Signs are sudden weakness or numbness of the face, or the face or eyelid drooping on one side. A - Arms. Signs are weakness or numbness in an arm. This happens suddenly and usually on one side of the body. S - Speech. Signs are sudden trouble speaking, slurred speech, or trouble understanding what people say. T - Time. Time to call emergency services. Write down what time symptoms started. You have other signs of a stroke, such as: A sudden, severe headache with no known cause. Nausea or vomiting. Seizure. These symptoms may represent a serious problem that is an emergency. Do not wait to see if the symptoms will go away. Get medical help right away. Call your local emergency services (911 in the [place] Do not drive yourself to the hospital. Summary Cholesterol plaques increase your risk for heart attack and stroke. Work with your health care provider to keep your cholesterol levels in a healthy range. Eat a healthy, balanced diet, get regular exercise, and maintain a healthy weight. Do not use any products that contain nicotine or tobacco, such as cigarettes, e-cigarettes, and chewing tobacco. Get help right away if you have any symptoms of a stroke. This information is not intended to replace advice given to you by your health care provider. Make sure you discuss any questions you have with your healthcare provider. Document Revised: 08/23/2019 Document Reviewed:  08/23/2019 Elsevier Patient Education  2022 [employer] Inc. Diabetes Mellitus Basics  Diabetes mellitus, or diabetes, is a long-term (chronic) disease. It occurs when the body does not properly use sugar (glucose) that is released from food after you eat. Diabetes mellitus may be caused by one or both of these problems: Your pancreas does not make enough of a hormone called insulin. Your body does not react in a normal way to the insulin that it makes. Insulin lets glucose enter cells in your body. This gives you energy. If you have diabetes, glucose cannot get into cells. This causes high blood glucose (hyperglycemia). How to treat and manage diabetes You may need to take insulin or other diabetes medicines daily to keep your glucose in balance. If you are prescribed insulin, you will learn how to give yourself insulin by injection. You may need to adjust the amount of insulin youtake based on the foods that you eat. You will need to check your blood glucose levels using a glucose monitor as told by your health care provider. The readings can help determine if you havelow or high blood glucose. Generally, you should have these blood glucose levels: Before meals (preprandial): 80-130 mg/dL (1.6-1.0 mmol/L). After meals (postprandial): below 180 mg/dL (10 mmol/L). Hemoglobin A1c (HbA1c) level: less than 7%. Your health care provider will set treatment goals for you. Keep all follow-up visits. This is important. Follow these instructions at home: Diabetes medicines Take your diabetes medicines every day as told by your health care provider. List your diabetes medicines here: Name of medicine: ______________________________ Amount (dose): _______________ Time (a.m./p.m.): _______________ Notes: ___________________________________ Name of medicine: ______________________________ Amount (dose): _______________ Time (a.m./p.m.): _______________ Notes: ___________________________________ Name of  medicine: ______________________________ Amount (dose): _______________ Time (a.m./p.m.): _______________ Notes: ___________________________________ Insulin If you use insulin, list the types of insulin you use here:  Insulin type: ______________________________ Amount (dose): _______________ Time (a.m./p.m.): _______________Notes: ___________________________________ Insulin type: ______________________________ Amount (dose): _______________ Time (a.m./p.m.): _______________ Notes: ___________________________________ Insulin type: ______________________________ Amount (dose): _______________ Time (a.m./p.m.): _______________ Notes: ___________________________________ Insulin type: ______________________________ Amount (dose): _______________ Time (a.m./p.m.): _______________ Notes: ___________________________________ Insulin type: ______________________________ Amount (dose): _______________ Time (a.m./p.m.): _______________ Notes: ___________________________________ Managing blood glucose  Check your blood glucose levels using a glucose monitor as told by your healthcare provider. Write down the times that you check your glucose levels here: Time: _______________ Notes: ___________________________________ Time: _______________ Notes: ___________________________________ Time: _______________ Notes: ___________________________________ Time: _______________ Notes: ___________________________________ Time: _______________ Notes: ___________________________________ Time: _______________ Notes: ___________________________________  Low blood glucose Low blood glucose (hypoglycemia) is when glucose is at or below 70 mg/dL (3.9 mmol/L). Symptoms may include: Feeling: Hungry. Sweaty and clammy. Irritable or easily upset. Dizzy. Sleepy. Having: A fast heartbeat. A headache. A change in your vision. Numbness around the mouth, lips, or tongue. Having trouble with: Moving  (coordination). Sleeping. Treating low blood glucose To treat low blood glucose, eat or drink something containing sugar right away. If you can think clearly and swallow safely, follow the 15:15 rule: Take 15 grams of a fast-acting carb (carbohydrate), as told by your health care provider. Some fast-acting carbs are: Glucose tablets: take 3-4 tablets. Hard candy: eat 3-5 pieces. Fruit juice: drink 4 oz (120 mL). Regular (not diet) soda: drink 4-6 oz (120-180 mL). Honey or sugar: eat 1 Tbsp (15 mL). Check your blood glucose levels 15 minutes after you take the carb. If your glucose is still at or below 70 mg/dL (3.9 mmol/L), take 15 grams of a carb again. If your glucose does not go above 70 mg/dL (3.9 mmol/L) after 3 tries, get help right away. After your glucose goes back to normal, eat a meal or a snack within 1 hour. Treating very low blood glucose If your glucose is at or below 54 mg/dL (3 mmol/L), you have very low blood glucose (severe hypoglycemia). This is an emergency. Do not wait to see if the symptoms will go away. Get medical help right away. Call your local emergency services (911 in the [place] Do not drive yourself to the hospital. Questions to ask your health care provider Should I talk with a diabetes educator? What equipment will I need to care for myself at home? What diabetes medicines do I need? When should I take them? How often do I need to check my blood glucose levels? What number can I call if I have questions? When is my follow-up visit? Where can I find a support group for people with diabetes? Where to find more information American Diabetes Association: www.diabetes.org Association of Diabetes Care and Education Specialists: www.diabeteseducator.org Contact a health care provider if: Your blood glucose is at or above 240 mg/dL (16.113.3 mmol/L) for 2 days in a row. You have been sick or have had a fever for 2 days or more, and you are not getting better. You  have any of these problems for more than 6 hours: You cannot eat or drink. You feel nauseous. You vomit. You have diarrhea. Get help right away if: Your blood glucose is lower than 54 mg/dL (3 mmol/L). You get confused. You have trouble thinking clearly. You have trouble breathing. These symptoms may represent a serious problem that is an emergency. Do not wait to see if the symptoms will go away. Get medical help right away. Call your local emergency services (911 in the [place] Do not drive yourself to the hospital.  Summary Diabetes mellitus is a chronic disease that occurs when the body does not properly use sugar (glucose) that is released from food after you eat. Take insulin and diabetes medicines as told. Check your blood glucose every day, as often as told. Keep all follow-up visits. This is important. This information is not intended to replace advice given to you by your health care provider. Make sure you discuss any questions you have with your healthcare provider. Document Revised: 01/25/2020 Document Reviewed: 01/25/2020 Elsevier Patient Education  2022 [employer].

## 2021-04-23 NOTE — Assessment & Plan Note (Signed)
Completed labs: CBC, CMP, patient has not started any medication in the last 3 months, we are starting all over, I will send medication to pharmacy after lab result come back. Provided education to patient on the importance of taking medication as prescribed. For health maintenance . Patient reports blood sugars run up to 300+ but he normally will not check BS consistently.

## 2021-04-23 NOTE — Progress Notes (Signed)
Established Patient Office Visit  Subjective:  Patient ID: Jack Porter, male    DOB: 11/19/1980  Age: 40 y.o. MRN: [id number]  CC:  Chief Complaint  Patient presents with   Medical Management of Chronic Issues    HPI JONAVIN SEDER Mixed hyperlipidemia  Pt presents with hyperlipidemia. Patient was diagnosed in 12/28/2020. Compliance with treatment has been poor The patient is not compliant with medications, tries to maintains a low cholesterol diet , follows up as directed , and does not maintain an exercise regimen . The patient denies experiencing any hypercholesterolemia related symptoms.      The patient presents with history of (type 2) diabetes mellitus without complications. Patient was diagnosed in 01/20/2014 compliance with treatment has been good; the patient is not taking medication as directed , He does not maintains a diabetic diet and an exercise regimen , He tries to follows up as directed , and is keeping a glucose diary. Sugars runs 300+. Patient specifically denies associated symptoms, including blurred vision, fatigue, polydipsia, polyphagia and polyuria . Patient denies hypoglycemia. In regard to preventative care, the patient performs foot self-exams daily and last ophthalmology exam was in : will schedule   Obesity: Patient complains of obesity. Patient cites health, increased physical ability, self-image as reasons for wanting to lose weight.  Obesity History Weight in late teens: 150 + lb. Period of greatest weight gain: 360 lb during early adult years Lowest adult weight: 301 Lb Highest adult weight: 360 Lb Amount of time at present weight: 301 lb.   History of Weight Loss Efforts Greatest amount of weight lost: 60 lb  Amount of time that loss was maintained: Unknown Circumstances associated with regain of weight: lifestyle/diet Successful weight loss techniques attempted: self-directed dieting Unsuccessful weight loss techniques attempted: nutritionist  consultation and none  Current Exercise Habits none  Current Eating Habits Number of regular meals per day: a few Number of snacking episodes per day: a few Who shops for food? patient and spouse Who prepares food? patient Who eats with patient? patient and spouse Binge behavior?: no Purge behavior? no Anorexic behavior? no Eating precipitated by stress? no Guilt feelings associated with eating? no  Other Potential Contributing Factors Use of alcohol: average :drinks/week : no alcohol consumption Use of medications that may cause weight gain none History of past abuse? none Psych History:  None Comorbidities: diabetes mellitus and dyslipidemias   Past Medical History:  Diagnosis Date   Diabetes mellitus without complication (HCC)    GERD (gastroesophageal reflux disease)    H/O hiatal hernia     Past Surgical History:  Procedure Laterality Date   CHOLECYSTECTOMY  01/21/2014   CHOLECYSTECTOMY N/A 01/21/2014   Procedure: LAPAROSCOPIC CHOLECYSTECTOMY;  Surgeon: Gayland Curry, MD;  Location: [facility];  Service: General;  Laterality: N/A;    Family History  Problem Relation Age of Onset   Diabetes Mother    Heart disease Sister    Diabetes Sister     Social History   Socioeconomic History   Marital status: Married    Spouse name: Not on file   Number of children: Not on file   Years of education: Not on file   Highest education level: Not on file  Occupational History   Not on file  Tobacco Use   Smoking status: Former    Packs/day: 0.25    Years: 3.00    Pack years: 0.75    Types: Cigarettes   Smokeless tobacco: Never  Vaping Use  Vaping Use: Never used  Substance and Sexual Activity   Alcohol use: No    Comment: rarely   Drug use: No   Sexual activity: Yes  Other Topics Concern   Not on file  Social History Narrative   Not on file   Social Determinants of Health   Financial Resource Strain: Not on file  Food Insecurity: Not on file   Transportation Needs: Not on file  Physical Activity: Not on file  Stress: Not on file  Social Connections: Not on file  Intimate Partner Violence: Not on file    Outpatient Medications Prior to Visit  Medication Sig Dispense Refill   blood glucose meter kit and supplies KIT Dispense based on patient and insurance preference. Use up to four times daily as directed. (FOR ICD-9 250.00, 250.01). 1 each 0   glucose blood (IGLUCOSE TEST STRIPS) test strip Use as instructed 100 each 12   glyBURIDE-metformin (GLUCOVANCE) 5-500 MG tablet Take 1 tablet by mouth daily with breakfast. 90 tablet 3   pioglitazone (ACTOS) 30 MG tablet Take 1 tablet (30 mg total) by mouth daily. 90 tablet 3   atorvastatin (LIPITOR) 20 MG tablet Take 1 tablet (20 mg total) by mouth daily. (Patient not taking: Reported on 04/23/2021) 90 tablet 3   empagliflozin (JARDIANCE) 10 MG TABS tablet Take 1 tablet (10 mg total) by mouth daily before breakfast. (Patient not taking: Reported on 04/23/2021) 30 tablet 1   cyclobenzaprine (FLEXERIL) 10 MG tablet Take 1 tablet (10 mg total) by mouth 3 (three) times daily as needed for muscle spasms. 30 tablet 1   ibuprofen (ADVIL) 600 MG tablet Take 1 tablet (600 mg total) by mouth every 8 (eight) hours as needed. 30 tablet 0   nystatin cream (MYCOSTATIN) Apply 1 application topically 2 (two) times daily. 30 g 0   predniSONE (STERAPRED UNI-PAK 21 TAB) 10 MG (21) TBPK tablet 6 tablets day 1, 5 tablet day 2, 4 tablet day 3, 3 tablets day before, 2 tablets daily 5, 1 tablet day 6. 1 each 0   No facility-administered medications prior to visit.    No Known Allergies  ROS Review of Systems  Constitutional: Negative.   HENT: Negative.    Respiratory: Negative.    Cardiovascular: Negative.   Gastrointestinal: Negative.   Musculoskeletal: Negative.   Skin:  Negative for rash.  Neurological: Negative.   All other systems reviewed and are negative.    Objective:    Physical  Exam Vitals and nursing note reviewed.  Constitutional:      Appearance: Normal appearance. He is obese.  HENT:     Head: Normocephalic.     Nose: Nose normal.     Mouth/Throat:     Mouth: Mucous membranes are moist.     Pharynx: Oropharynx is clear.  Eyes:     Conjunctiva/sclera: Conjunctivae normal.  Cardiovascular:     Rate and Rhythm: Normal rate and regular rhythm.     Pulses: Normal pulses.     Heart sounds: Normal heart sounds.  Pulmonary:     Effort: Pulmonary effort is normal.     Breath sounds: Normal breath sounds.  Abdominal:     General: Bowel sounds are normal.  Skin:    General: Skin is warm.     Findings: No rash.  Neurological:     Mental Status: He is alert and oriented to person, place, and time.  Psychiatric:        Behavior: Behavior normal.  BP 109/70   Pulse 78   Temp 97.6 F (36.4 C) (Temporal)   Ht 6' 2"  (1.88 m)   Wt (!) 301 lb (136.5 kg)   SpO2 98%   BMI 38.65 kg/m  Wt Readings from Last 3 Encounters:  04/23/21 (!) 301 lb (136.5 kg)  02/20/21 296 lb (134.3 kg)  12/28/20 298 lb (135.2 kg)     Health Maintenance Due  Topic Date Due   OPHTHALMOLOGY EXAM  Never done   COVID-19 Vaccine (3 - Booster for Moderna series) 07/05/2020    There are no preventive care reminders to display for this patient.  Lab Results  Component Value Date   TSH 1.880 11/02/2018   Lab Results  Component Value Date   WBC 5.9 12/28/2020   HGB 16.0 12/28/2020   HCT 47.1 12/28/2020   MCV 87 12/28/2020   PLT 142 (L) 12/28/2020   Lab Results  Component Value Date   NA 144 12/28/2020   K 4.3 12/28/2020   CO2 23 12/28/2020   GLUCOSE 173 (H) 12/28/2020   BUN 11 12/28/2020   CREATININE 0.62 (L) 12/28/2020   BILITOT 1.6 (H) 12/28/2020   ALKPHOS 106 12/28/2020   AST 39 12/28/2020   ALT 41 12/28/2020   PROT 7.1 12/28/2020   ALBUMIN 4.1 12/28/2020   CALCIUM 9.2 12/28/2020   EGFR 124 12/28/2020   Lab Results  Component Value Date   CHOL 154  12/28/2020   Lab Results  Component Value Date   HDL 30 (L) 12/28/2020   Lab Results  Component Value Date   LDLCALC 106 (H) 12/28/2020   Lab Results  Component Value Date   TRIG 97 12/28/2020   Lab Results  Component Value Date   CHOLHDL 5.1 (H) 12/28/2020   Lab Results  Component Value Date   HGBA1C 9.3 (H) 12/28/2020      Assessment & Plan:   Problem List Items Addressed This Visit       Endocrine   Type 2 diabetes mellitus with hypertriglyceridemia ([place]) - Primary    Completed labs: CBC, CMP, patient has not started any medication in the last 3 months, we are starting all over, I will send medication to pharmacy after lab result come back. Provided education to patient on the importance of taking medication as prescribed. For health maintenance . Patient reports blood sugars run up to 300+ but he normally will not check BS consistently.        Relevant Orders   CBC with Differential   Comprehensive metabolic panel     Other   Obesity (BMI 30-39.9)    Education provided to patient and educational material given to patient for calorie counting and weight loss. And exercise. Healthy diet. Nutrition on the fast track and healthy diabetic eating plan.       Relevant Orders   CBC with Differential   Comprehensive metabolic panel   Lipid Panel   Mixed hyperlipidemia    Completed Lipid panel labs, results pending.       Relevant Orders   Lipid Panel    No orders of the defined types were placed in this encounter.   Follow-up: Return in about 3 months (around 07/24/2021).    Ivy Lynn, NP

## 2021-04-23 NOTE — Assessment & Plan Note (Signed)
Education provided to patient and educational material given to patient for calorie counting and weight loss. And exercise. Healthy diet. Nutrition on the fast track and healthy diabetic eating plan.

## 2021-04-24 LAB — CBC WITH DIFFERENTIAL/PLATELET
Basophils Absolute: 0 10*3/uL (ref 0.0–0.2)
Basos: 1 %
EOS (ABSOLUTE): 0.1 10*3/uL (ref 0.0–0.4)
Eos: 3 %
Hematocrit: 47.8 % (ref 37.5–51.0)
Hemoglobin: 16.2 g/dL (ref 13.0–17.7)
Immature Grans (Abs): 0 10*3/uL (ref 0.0–0.1)
Immature Granulocytes: 0 %
Lymphocytes Absolute: 1.8 10*3/uL (ref 0.7–3.1)
Lymphs: 44 %
MCH: 29.5 pg (ref 26.6–33.0)
MCHC: 33.9 g/dL (ref 31.5–35.7)
MCV: 87 fL (ref 79–97)
Monocytes Absolute: 0.2 10*3/uL (ref 0.1–0.9)
Monocytes: 6 %
Neutrophils Absolute: 1.9 10*3/uL (ref 1.4–7.0)
Neutrophils: 46 %
Platelets: 133 10*3/uL — ABNORMAL LOW (ref 150–450)
RBC: 5.49 x10E6/uL (ref 4.14–5.80)
RDW: 13.3 % (ref 11.6–15.4)
WBC: 4.2 10*3/uL (ref 3.4–10.8)

## 2021-04-24 LAB — COMPREHENSIVE METABOLIC PANEL
ALT: 26 IU/L (ref 0–44)
AST: 23 IU/L (ref 0–40)
Albumin/Globulin Ratio: 1.4 (ref 1.2–2.2)
Albumin: 4.1 g/dL (ref 4.0–5.0)
Alkaline Phosphatase: 97 IU/L (ref 44–121)
BUN/Creatinine Ratio: 15 (ref 9–20)
BUN: 10 mg/dL (ref 6–24)
Bilirubin Total: 1.3 mg/dL — ABNORMAL HIGH (ref 0.0–1.2)
CO2: 24 mmol/L (ref 20–29)
Calcium: 8.8 mg/dL (ref 8.7–10.2)
Chloride: 103 mmol/L (ref 96–106)
Creatinine, Ser: 0.68 mg/dL — ABNORMAL LOW (ref 0.76–1.27)
Globulin, Total: 3 g/dL (ref 1.5–4.5)
Glucose: 182 mg/dL — ABNORMAL HIGH (ref 65–99)
Potassium: 4.1 mmol/L (ref 3.5–5.2)
Sodium: 138 mmol/L (ref 134–144)
Total Protein: 7.1 g/dL (ref 6.0–8.5)
eGFR: 120 mL/min/{1.73_m2} (ref >59)

## 2021-04-24 LAB — LIPID PANEL
Chol/HDL Ratio: 5.3 ratio — ABNORMAL HIGH (ref 0.0–5.0)
Cholesterol, Total: 158 mg/dL (ref 100–199)
HDL: 30 mg/dL — ABNORMAL LOW (ref >39)
LDL Chol Calc (NIH): 109 mg/dL — ABNORMAL HIGH (ref 0–99)
Triglycerides: 102 mg/dL (ref 0–149)
VLDL Cholesterol Cal: 19 mg/dL (ref 5–40)

## 2021-05-08 ENCOUNTER — Telehealth: Payer: Self-pay | Admitting: *Deleted

## 2021-05-08 NOTE — Telephone Encounter (Signed)
Approved, [facility] aware

## 2021-05-08 NOTE — Telephone Encounter (Signed)
Approved = [facility] aware

## 2021-05-08 NOTE — Telephone Encounter (Signed)
Key: [id number] Rx #: [id number] Pioglitazone HCl 30MG  tablets  Sent to plan

## 2021-05-08 NOTE — Telephone Encounter (Signed)
Key: [id number]  PA Case ID: [id number]  Rx #: [id number]  Prior Auth sent to plan

## 2021-06-14 LAB — HM DIABETES EYE EXAM

## 2021-07-18 DIAGNOSIS — H40013 Open angle with borderline findings, low risk, bilateral: Secondary | ICD-10-CM | POA: Diagnosis not present

## 2021-07-18 DIAGNOSIS — E119 Type 2 diabetes mellitus without complications: Secondary | ICD-10-CM | POA: Diagnosis not present

## 2021-07-18 DIAGNOSIS — H0015 Chalazion left lower eyelid: Secondary | ICD-10-CM | POA: Diagnosis not present

## 2021-07-25 ENCOUNTER — Ambulatory Visit: Payer: BC Managed Care – PPO | Admitting: Nurse Practitioner

## 2021-07-25 ENCOUNTER — Other Ambulatory Visit: Payer: Self-pay

## 2021-07-25 ENCOUNTER — Encounter: Payer: Self-pay | Admitting: Nurse Practitioner

## 2021-07-25 VITALS — BP 140/80 | HR 110 | Temp 97.7°F | Ht 74.0 in | Wt 313.0 lb

## 2021-07-25 DIAGNOSIS — E782 Mixed hyperlipidemia: Secondary | ICD-10-CM | POA: Diagnosis not present

## 2021-07-25 DIAGNOSIS — E669 Obesity, unspecified: Secondary | ICD-10-CM

## 2021-07-25 DIAGNOSIS — E1169 Type 2 diabetes mellitus with other specified complication: Secondary | ICD-10-CM | POA: Diagnosis not present

## 2021-07-25 DIAGNOSIS — E781 Pure hyperglyceridemia: Secondary | ICD-10-CM

## 2021-07-25 DIAGNOSIS — E119 Type 2 diabetes mellitus without complications: Secondary | ICD-10-CM

## 2021-07-25 LAB — BAYER DCA HB A1C WAIVED: HB A1C (BAYER DCA - WAIVED): 7.8 % — ABNORMAL HIGH (ref 4.8–5.6)

## 2021-07-25 NOTE — Assessment & Plan Note (Signed)
Completed A1c results pending.  Continue diabetic diet , exeresis and weight loss. Follow up in 3 months.

## 2021-07-25 NOTE — Assessment & Plan Note (Signed)
Patient continues to work on low cholesterol diet and and exercise. Return in 3 months

## 2021-07-25 NOTE — Patient Instructions (Signed)
High Cholesterol High cholesterol is a condition in which the blood has high levels of a white, waxy substance similar to fat (cholesterol). The liver makes all the cholesterol that the body needs. The human body needs small amounts of cholesterol to help build cells. A person gets extra or excess cholesterol from the food that he or she eats. The blood carries cholesterol from the liver to the rest of the body. If you have high cholesterol, deposits (plaques) may build up on the walls of your arteries. Arteries are the blood vessels that carry blood away from your heart. These plaques make the arteries narrow and stiff. Cholesterol plaques increase your risk for heart attack and stroke. Work with your health care provider to keep your cholesterol levels in a healthy range. What increases the risk? The following factors may make you more likely to develop this condition: Eating foods that are high in animal fat (saturated fat) or cholesterol. Being overweight. Not getting enough exercise. A family history of high cholesterol (familial hypercholesterolemia). Use of tobacco products. Having diabetes. What are the signs or symptoms? In most cases, high cholesterol does not usually cause any symptoms. In severe cases, very high cholesterol levels can cause: Fatty bumps under the skin (xanthomas). A white or gray ring around the black center (pupil) of the eye. How is this diagnosed? This condition may be diagnosed based on the results of a blood test. If you are older than 41 years of age, your health care provider may check your cholesterol levels every 4-6 years. You may be checked more often if you have high cholesterol or other risk factors for heart disease. The blood test for cholesterol measures: "Bad" cholesterol, or LDL cholesterol. This is the main type of cholesterol that causes heart disease. The desired level is less than 100 mg/dL (2.59 mmol/L). "Good" cholesterol, or HDL  cholesterol. HDL helps protect against heart disease by cleaning the arteries and carrying the LDL to the liver for processing. The desired level for HDL is 60 mg/dL (1.55 mmol/L) or higher. Triglycerides. These are fats that your body can store or burn for energy. The desired level is less than 150 mg/dL (1.69 mmol/L). Total cholesterol. This measures the total amount of cholesterol in your blood and includes LDL, HDL, and triglycerides. The desired level is less than 200 mg/dL (5.17 mmol/L). How is this treated? Treatment for high cholesterol starts with lifestyle changes, such as diet and exercise. Diet changes. You may be asked to eat foods that have more fiber and less saturated fats or added sugar. Lifestyle changes. These may include regular exercise, maintaining a healthy weight, and quitting use of tobacco products. Medicines. These are given when diet and lifestyle changes have not worked. You may be prescribed a statin medicine to help lower your cholesterol levels. Follow these instructions at home: Eating and drinking  Eat a healthy, balanced diet. This diet includes: Daily servings of a variety of fresh, frozen, or canned fruits and vegetables. Daily servings of whole grain foods that are rich in fiber. Foods that are low in saturated fats and trans fats. These include poultry and fish without skin, lean cuts of meat, and low-fat dairy products. A variety of fish, especially oily fish that contain omega-3 fatty acids. Aim to eat fish at least 2 times a week. Avoid foods and drinks that have added sugar. Use healthy cooking methods, such as roasting, grilling, broiling, baking, poaching, steaming, and stir-frying. Do not fry your food except for stir-frying.   If you drink alcohol: Limit how much you have to: 0-1 drink a day for women who are not pregnant. 0-2 drinks a day for men. Know how much alcohol is in a drink. In the [place] one drink equals one 12 oz bottle of beer (355 mL),  one 5 oz glass of wine (148 mL), or one 1 oz glass of hard liquor (44 mL). Lifestyle  Get regular exercise. Aim to exercise for a total of 150 minutes a week. Increase your activity level by doing activities such as gardening, walking, and taking the stairs. Do not use any products that contain nicotine or tobacco. These products include cigarettes, chewing tobacco, and vaping devices, such as e-cigarettes. If you need help quitting, ask your health care provider. General instructions Take over-the-counter and prescription medicines only as told by your health care provider. Keep all follow-up visits. This is important. Where to find more information American Heart Association: www.heart.org National Heart, Lung, and Blood Institute: www.nhlbi.nih.gov Contact a health care provider if: You have trouble achieving or maintaining a healthy diet or weight. You are starting an exercise program. You are unable to stop smoking. Get help right away if: You have chest pain. You have trouble breathing. You have discomfort or pain in your jaw, neck, back, shoulder, or arm. You have any symptoms of a stroke. "BE FAST" is an easy way to remember the main warning signs of a stroke: B - Balance. Signs are dizziness, sudden trouble walking, or loss of balance. E - Eyes. Signs are trouble seeing or a sudden change in vision. F - Face. Signs are sudden weakness or numbness of the face, or the face or eyelid drooping on one side. A - Arms. Signs are weakness or numbness in an arm. This happens suddenly and usually on one side of the body. S - Speech. Signs are sudden trouble speaking, slurred speech, or trouble understanding what people say. T - Time. Time to call emergency services. Write down what time symptoms started. You have other signs of a stroke, such as: A sudden, severe headache with no known cause. Nausea or vomiting. Seizure. These symptoms may represent a serious problem that is an  emergency. Do not wait to see if the symptoms will go away. Get medical help right away. Call your local emergency services (911 in the [place] Do not drive yourself to the hospital. Summary Cholesterol plaques increase your risk for heart attack and stroke. Work with your health care provider to keep your cholesterol levels in a healthy range. Eat a healthy, balanced diet, get regular exercise, and maintain a healthy weight. Do not use any products that contain nicotine or tobacco. These products include cigarettes, chewing tobacco, and vaping devices, such as e-cigarettes. Get help right away if you have any symptoms of a stroke. This information is not intended to replace advice given to you by your health care provider. Make sure you discuss any questions you have with your health care provider. Document Revised: 12/07/2020 Document Reviewed: 11/27/2020 Elsevier Patient Education  2022 Elsevier Inc. Hypertension, Adult Hypertension is another name for high blood pressure. High blood pressure forces your heart to work harder to pump blood. This can cause problems over time. There are two numbers in a blood pressure reading. There is a top number (systolic) over a bottom number (diastolic). It is best to have a blood pressure that is below 120/80. Healthy choices can help lower your blood pressure, or you may need medicine to help lower   it. What are the causes? The cause of this condition is not known. Some conditions may be related to high blood pressure. What increases the risk? Smoking. Having type 2 diabetes mellitus, high cholesterol, or both. Not getting enough exercise or physical activity. Being overweight. Having too much fat, sugar, calories, or salt (sodium) in your diet. Drinking too much alcohol. Having long-term (chronic) kidney disease. Having a family history of high blood pressure. Age. Risk increases with age. Race. You may be at higher risk if you are African  American. Gender. Men are at higher risk than women before age 45. After age 65, women are at higher risk than men. Having obstructive sleep apnea. Stress. What are the signs or symptoms? High blood pressure may not cause symptoms. Very high blood pressure (hypertensive crisis) may cause: Headache. Feelings of worry or nervousness (anxiety). Shortness of breath. Nosebleed. A feeling of being sick to your stomach (nausea). Throwing up (vomiting). Changes in how you see. Very bad chest pain. Seizures. How is this treated? This condition is treated by making healthy lifestyle changes, such as: Eating healthy foods. Exercising more. Drinking less alcohol. Your health care provider may prescribe medicine if lifestyle changes are not enough to get your blood pressure under control, and if: Your top number is above 130. Your bottom number is above 80. Your personal target blood pressure may vary. Follow these instructions at home: Eating and drinking  If told, follow the DASH eating plan. To follow this plan: Fill one half of your plate at each meal with fruits and vegetables. Fill one fourth of your plate at each meal with whole grains. Whole grains include whole-wheat pasta, brown rice, and whole-grain bread. Eat or drink low-fat dairy products, such as skim milk or low-fat yogurt. Fill one fourth of your plate at each meal with low-fat (lean) proteins. Low-fat proteins include fish, chicken without skin, eggs, beans, and tofu. Avoid fatty meat, cured and processed meat, or chicken with skin. Avoid pre-made or processed food. Eat less than 1,500 mg of salt each day. Do not drink alcohol if: Your doctor tells you not to drink. You are pregnant, may be pregnant, or are planning to become pregnant. If you drink alcohol: Limit how much you use to: 0-1 drink a day for women. 0-2 drinks a day for men. Be aware of how much alcohol is in your drink. In the [place] one drink equals one 12  oz bottle of beer (355 mL), one 5 oz glass of wine (148 mL), or one 1 oz glass of hard liquor (44 mL). Lifestyle  Work with your doctor to stay at a healthy weight or to lose weight. Ask your doctor what the best weight is for you. Get at least 30 minutes of exercise most days of the week. This may include walking, swimming, or biking. Get at least 30 minutes of exercise that strengthens your muscles (resistance exercise) at least 3 days a week. This may include lifting weights or doing Pilates. Do not use any products that contain nicotine or tobacco, such as cigarettes, e-cigarettes, and chewing tobacco. If you need help quitting, ask your doctor. Check your blood pressure at home as told by your doctor. Keep all follow-up visits as told by your doctor. This is important. Medicines Take over-the-counter and prescription medicines only as told by your doctor. Follow directions carefully. Do not skip doses of blood pressure medicine. The medicine does not work as well if you skip doses. Skipping doses also puts   you at risk for problems. Ask your doctor about side effects or reactions to medicines that you should watch for. Contact a doctor if you: Think you are having a reaction to the medicine you are taking. Have headaches that keep coming back (recurring). Feel dizzy. Have swelling in your ankles. Have trouble with your vision. Get help right away if you: Get a very bad headache. Start to feel mixed up (confused). Feel weak or numb. Feel faint. Have very bad pain in your: Chest. Belly (abdomen). Throw up more than once. Have trouble breathing. Summary Hypertension is another name for high blood pressure. High blood pressure forces your heart to work harder to pump blood. For most people, a normal blood pressure is less than 120/80. Making healthy choices can help lower blood pressure. If your blood pressure does not get lower with healthy choices, you may need to take  medicine. This information is not intended to replace advice given to you by your health care provider. Make sure you discuss any questions you have with your health care provider. Document Revised: 06/03/2018 Document Reviewed: 06/03/2018 Elsevier Patient Education  2022 Elsevier Inc.  

## 2021-07-25 NOTE — Assessment & Plan Note (Signed)
Patient continues to work on diet and exercise he reports drinking more water. And eating more fruits and vegetables. All labs completed at work. Follow up in 3 months.

## 2021-07-25 NOTE — Progress Notes (Signed)
Established Patient Office Visit  Subjective:  Patient ID: Jack Porter, male    DOB: 1981-01-27  Age: 40 y.o. MRN: [id number]  CC:  Chief Complaint  Patient presents with   Medical Management of Chronic Issues    HPI Jack Porter  The patient presents with history of type II diabetes mellitus without complications. Patient was diagnosed in 11/02/2018. Compliance with treatment has been good; the patient takes medication as directed , maintains a diabetic diet and an exercise regimen , follows up as directed , and is keeping a glucose diary. Sugars runs 150-220. Patient specifically denies associated symptoms, including blurred vision, fatigue, polydipsia, polyphagia and polyuria . Patient denies hypoglycemia. In regard to preventative care, the patient performs foot self-exams daily and last ophthalmology exam was in 1 year ago..  Current medication Actos 30 mg by mouth daily, Glucovance 5-500 mg    Mixed hyperlipidemia  Pt presents with hyperlipidemia. Patient was diagnosed in . Compliance with treatment has been good; The patient is compliant with medications, maintains a low cholesterol diet , follows up as directed , and maintains an exercise regimen . The patient denies experiencing any hypercholesterolemia related symptoms.   Current medication atorvastatin 40 mg tablet by mouth daily   Past Medical History:  Diagnosis Date   Diabetes mellitus without complication (HCC)    GERD (gastroesophageal reflux disease)    H/O hiatal hernia     Past Surgical History:  Procedure Laterality Date   CHOLECYSTECTOMY  01/21/2014   CHOLECYSTECTOMY N/A 01/21/2014   Procedure: LAPAROSCOPIC CHOLECYSTECTOMY;  Surgeon: Gayland Curry, MD;  Location: [facility];  Service: General;  Laterality: N/A;    Family History  Problem Relation Age of Onset   Diabetes Mother    Heart disease Sister    Diabetes Sister     Social History   Socioeconomic History   Marital status: Married    Spouse  name: Not on file   Number of children: Not on file   Years of education: Not on file   Highest education level: Not on file  Occupational History   Not on file  Tobacco Use   Smoking status: Former    Packs/day: 0.25    Years: 3.00    Pack years: 0.75    Types: Cigarettes   Smokeless tobacco: Never  Vaping Use   Vaping Use: Never used  Substance and Sexual Activity   Alcohol use: No    Comment: rarely   Drug use: No   Sexual activity: Yes  Other Topics Concern   Not on file  Social History Narrative   Not on file   Social Determinants of Health   Financial Resource Strain: Not on file  Food Insecurity: Not on file  Transportation Needs: Not on file  Physical Activity: Not on file  Stress: Not on file  Social Connections: Not on file  Intimate Partner Violence: Not on file    Outpatient Medications Prior to Visit  Medication Sig Dispense Refill   blood glucose meter kit and supplies KIT Dispense based on patient and insurance preference. Use up to four times daily as directed. (FOR ICD-9 250.00, 250.01). 1 each 0   glucose blood (IGLUCOSE TEST STRIPS) test strip Use as instructed 100 each 12   glyBURIDE-metformin (GLUCOVANCE) 5-500 MG tablet Take 1 tablet by mouth daily with breakfast. 90 tablet 3   pioglitazone (ACTOS) 30 MG tablet Take 1 tablet (30 mg total) by mouth daily. 90 tablet 3  atorvastatin (LIPITOR) 20 MG tablet Take 1 tablet (20 mg total) by mouth daily. (Patient not taking: Reported on 04/23/2021) 90 tablet 3   empagliflozin (JARDIANCE) 10 MG TABS tablet Take 1 tablet (10 mg total) by mouth daily before breakfast. (Patient not taking: Reported on 04/23/2021) 30 tablet 1   No facility-administered medications prior to visit.    No Known Allergies  ROS Review of Systems  Constitutional: Negative.   HENT: Negative.    Respiratory: Negative.    Cardiovascular: Negative.   Gastrointestinal: Negative.   Musculoskeletal: Negative.   Skin:  Negative for  rash.  Neurological: Negative.  Negative for light-headedness.  Psychiatric/Behavioral: Negative.    All other systems reviewed and are negative.    Objective:    Physical Exam Vitals and nursing note reviewed.  Constitutional:      Appearance: Normal appearance.  HENT:     Head: Normocephalic.     Right Ear: Ear canal and external ear normal.     Left Ear: Ear canal and external ear normal.     Mouth/Throat:     Mouth: Mucous membranes are moist.     Pharynx: Oropharynx is clear.  Eyes:     Conjunctiva/sclera: Conjunctivae normal.  Cardiovascular:     Rate and Rhythm: Normal rate and regular rhythm.     Heart sounds: Normal heart sounds.  Pulmonary:     Effort: Pulmonary effort is normal.     Breath sounds: Normal breath sounds.  Abdominal:     General: Bowel sounds are normal.  Musculoskeletal:        General: Normal range of motion.  Skin:    General: Skin is warm.  Neurological:     Mental Status: He is alert and oriented to person, place, and time.  Psychiatric:        Behavior: Behavior normal.    BP 140/80   Pulse (!) 110   Temp 97.7 F (36.5 C) (Temporal)   Ht 6' 2"  (1.88 m)   Wt (!) 313 lb (142 kg)   SpO2 96%   BMI 40.19 kg/m  Wt Readings from Last 3 Encounters:  07/25/21 (!) 313 lb (142 kg)  04/23/21 (!) 301 lb (136.5 kg)  02/20/21 296 lb (134.3 kg)     Health Maintenance Due  Topic Date Due   Pneumococcal Vaccine 16-104 Years old (1 - PCV) Never done   OPHTHALMOLOGY EXAM  Never done   COVID-19 Vaccine (3 - Booster for Moderna series) 07/05/2020   HEMOGLOBIN A1C  06/30/2021    There are no preventive care reminders to display for this patient.  Lab Results  Component Value Date   TSH 1.880 11/02/2018   Lab Results  Component Value Date   WBC 4.2 04/23/2021   HGB 16.2 04/23/2021   HCT 47.8 04/23/2021   MCV 87 04/23/2021   PLT 133 (L) 04/23/2021   Lab Results  Component Value Date   NA 138 04/23/2021   K 4.1 04/23/2021   CO2 24  04/23/2021   GLUCOSE 182 (H) 04/23/2021   BUN 10 04/23/2021   CREATININE 0.68 (L) 04/23/2021   BILITOT 1.3 (H) 04/23/2021   ALKPHOS 97 04/23/2021   AST 23 04/23/2021   ALT 26 04/23/2021   PROT 7.1 04/23/2021   ALBUMIN 4.1 04/23/2021   CALCIUM 8.8 04/23/2021   EGFR 120 04/23/2021   Lab Results  Component Value Date   CHOL 158 04/23/2021   Lab Results  Component Value Date   HDL 30 (  L) 04/23/2021   Lab Results  Component Value Date   LDLCALC 109 (H) 04/23/2021   Lab Results  Component Value Date   TRIG 102 04/23/2021   Lab Results  Component Value Date   CHOLHDL 5.3 (H) 04/23/2021   Lab Results  Component Value Date   HGBA1C 9.3 (H) 12/28/2020      Assessment & Plan:   Problem List Items Addressed This Visit       Endocrine   Type 2 diabetes mellitus with hypertriglyceridemia ([place])   Relevant Orders   Bayer DCA Hb A1c Waived   Type 2 diabetes mellitus without complication, without long-term current use of insulin ([place])    Completed A1c results pending.  Continue diabetic diet , exeresis and weight loss. Follow up in 3 months.          Other   Obesity (BMI 30-39.9)    Patient continues to work on diet and exercise he reports drinking more water. And eating more fruits and vegetables. All labs completed at work. Follow up in 3 months.      Mixed hyperlipidemia - Primary    Patient continues to work on low cholesterol diet and and exercise. Return in 3 months        No orders of the defined types were placed in this encounter.   Follow-up: Return in about 3 months (around 10/25/2021).    Ivy Lynn, NP

## 2021-07-30 ENCOUNTER — Ambulatory Visit (INDEPENDENT_AMBULATORY_CARE_PROVIDER_SITE_OTHER): Payer: BC Managed Care – PPO | Admitting: Family Medicine

## 2021-07-30 DIAGNOSIS — J020 Streptococcal pharyngitis: Secondary | ICD-10-CM

## 2021-07-30 MED ORDER — MAGIC MOUTHWASH W/LIDOCAINE: ORAL | 0 refills | Status: DC

## 2021-07-30 MED ORDER — AMOXICILLIN 500 MG PO CAPS: 500.0000 mg | ORAL_CAPSULE | Freq: Two times a day (BID) | ORAL | 0 refills | Status: AC

## 2021-07-30 NOTE — Progress Notes (Signed)
Telephone visit  Subjective: CC:sore throat PCP: Ijaola, Onyeje M, NP HPI:Jack Porter is a 40 y.o. male calls for telephone consult today. Patient provides verbal consent for consult held via phone.  Due to COVID-19 pandemic this visit was conducted virtually. This visit type was conducted due to national recommendations for restrictions regarding the COVID-19 Pandemic (e.g. social distancing, sheltering in place) in an effort to limit this patient's exposure and mitigate transmission in our community. All issues noted in this document were discussed and addressed.  A physical exam was not performed with this format.   Location of patient: home Location of provider: [facility] Others present for call: wife  1. Sore throat He reports sore throat that is exudative.  He notes it feels very similar to his strep throat.  No fever, nausea, headache, rashes.  Onset was yesterday.  He reports someone that was possibly ill at work.  Rapid COVID test at work was negative.  Not using anything at home right now.   ROS: Per HPI  No Known Allergies Past Medical History:  Diagnosis Date   Diabetes mellitus without complication (HCC)    GERD (gastroesophageal reflux disease)    H/O hiatal hernia     Current Outpatient Medications:    blood glucose meter kit and supplies KIT, Dispense based on patient and insurance preference. Use up to four times daily as directed. (FOR ICD-9 250.00, 250.01)., Disp: 1 each, Rfl: 0   glucose blood (IGLUCOSE TEST STRIPS) test strip, Use as instructed, Disp: 100 each, Rfl: 12   glyBURIDE-metformin (GLUCOVANCE) 5-500 MG tablet, Take 1 tablet by mouth daily with breakfast., Disp: 90 tablet, Rfl: 3   pioglitazone (ACTOS) 30 MG tablet, Take 1 tablet (30 mg total) by mouth daily., Disp: 90 tablet, Rfl: 3  Assessment/ Plan: 40 y.o. male   Strep throat - Plan: amoxicillin (AMOXIL) 500 MG capsule, magic mouthwash w/lidocaine SOLN  Highly suspicious for strep throat.  Will  empirically treat.  Amoxicillin. MMW. Return and emergent precautions reviewed.  He voiced good understanding.  Work note sent to MyChart.  Start time: 10:53pm End time: 11:00am  Total time spent on patient care (including telephone call/ virtual visit): 7 minutes  Ashly M Gottschalk, DO [facility] [phone]   

## 2021-08-02 ENCOUNTER — Encounter: Payer: Self-pay | Admitting: *Deleted

## 2021-08-16 ENCOUNTER — Ambulatory Visit: Payer: BC Managed Care – PPO | Admitting: Family Medicine

## 2021-08-16 ENCOUNTER — Encounter: Payer: Self-pay | Admitting: Family Medicine

## 2021-08-16 ENCOUNTER — Other Ambulatory Visit: Payer: Self-pay

## 2021-08-16 VITALS — BP 131/78 | HR 99 | Temp 98.3°F | Ht 74.0 in | Wt 317.4 lb

## 2021-08-16 DIAGNOSIS — M79671 Pain in right foot: Secondary | ICD-10-CM

## 2021-08-16 MED ORDER — PREDNISONE 10 MG (21) PO TBPK: ORAL_TABLET | ORAL | 0 refills | Status: DC

## 2021-08-16 MED ORDER — DICLOFENAC SODIUM 75 MG PO TBEC: 75.0000 mg | DELAYED_RELEASE_TABLET | Freq: Two times a day (BID) | ORAL | 0 refills | Status: DC

## 2021-08-16 NOTE — Progress Notes (Signed)
Acute Office Visit  Subjective:    Patient ID: Jack Porter, male    DOB: December 11, 1980, 40 y.o.   MRN: [id number]  Chief Complaint  Patient presents with   Foot Pain    HPI Patient is in today for right heel pain x 2 weeks. The pain is from his heel to his mid foot. The pain is worse with the first few steps after sitting or resting for some time.  The pain is also worse in the morning or if he has to get up in the middle of the night. He finds himself limping first thing in the morning. He has been taking 800 mg of ibuprofen twice a day with a little improvement. He has had this issue before and then it resolved for the last few months until recently. He denies injury. He does work on [employer].   Past Medical History:  Diagnosis Date   Diabetes mellitus without complication (HCC)    GERD (gastroesophageal reflux disease)    H/O hiatal hernia     Past Surgical History:  Procedure Laterality Date   CHOLECYSTECTOMY  01/21/2014   CHOLECYSTECTOMY N/A 01/21/2014   Procedure: LAPAROSCOPIC CHOLECYSTECTOMY;  Surgeon: Gayland Curry, MD;  Location: [facility];  Service: General;  Laterality: N/A;    Family History  Problem Relation Age of Onset   Diabetes Mother    Heart disease Sister    Diabetes Sister     Social History   Socioeconomic History   Marital status: Married    Spouse name: Not on file   Number of children: Not on file   Years of education: Not on file   Highest education level: Not on file  Occupational History   Not on file  Tobacco Use   Smoking status: Former    Packs/day: 0.25    Years: 3.00    Pack years: 0.75    Types: Cigarettes   Smokeless tobacco: Never  Vaping Use   Vaping Use: Never used  Substance and Sexual Activity   Alcohol use: No    Comment: rarely   Drug use: No   Sexual activity: Yes  Other Topics Concern   Not on file  Social History Narrative   Not on file   Social Determinants of Health   Financial Resource Strain: Not on  file  Food Insecurity: Not on file  Transportation Needs: Not on file  Physical Activity: Not on file  Stress: Not on file  Social Connections: Not on file  Intimate Partner Violence: Not on file    Outpatient Medications Prior to Visit  Medication Sig Dispense Refill   blood glucose meter kit and supplies KIT Dispense based on patient and insurance preference. Use up to four times daily as directed. (FOR ICD-9 250.00, 250.01). 1 each 0   glucose blood (IGLUCOSE TEST STRIPS) test strip Use as instructed 100 each 12   glyBURIDE-metformin (GLUCOVANCE) 5-500 MG tablet Take 1 tablet by mouth daily with breakfast. 90 tablet 3   ibuprofen (ADVIL) 800 MG tablet Take 800 mg by mouth every 8 (eight) hours as needed.     magic mouthwash w/lidocaine SOLN Gargle and spit 15m every 6 hours as needed for sore throat. 658mlidocaine, 6075mystatin, 13m86mdrocortisone tab, qs benadryl total 480mL46m0 mL 0   pioglitazone (ACTOS) 30 MG tablet Take 1 tablet (30 mg total) by mouth daily. 90 tablet 3   No facility-administered medications prior to visit.    No Known Allergies  Review of Systems As per HPI.     Objective:    Physical Exam Vitals and nursing note reviewed.  Constitutional:      General: He is not in acute distress.    Appearance: He is obese. He is not ill-appearing, toxic-appearing or diaphoretic.  Pulmonary:     Effort: Pulmonary effort is normal. No respiratory distress.  Musculoskeletal:     Right foot: Normal range of motion. No foot drop.  Feet:     Right foot:     Skin integrity: No skin breakdown, erythema, warmth or dry skin.  Skin:    General: Skin is warm and dry.  Neurological:     General: No focal deficit present.     Mental Status: He is alert and oriented to person, place, and time.  Psychiatric:        Mood and Affect: Mood normal.        Behavior: Behavior normal.        Thought Content: Thought content normal.        Judgment: Judgment normal.    BP  131/78   Pulse 99   Temp 98.3 F (36.8 C) (Temporal)   Ht 6' 2" (1.88 m)   Wt (!) 317 lb 6 oz (144 kg)   BMI 40.75 kg/m  Wt Readings from Last 3 Encounters:  08/16/21 (!) 317 lb 6 oz (144 kg)  07/25/21 (!) 313 lb (142 kg)  04/23/21 (!) 301 lb (136.5 kg)    Health Maintenance Due  Topic Date Due   Pneumococcal Vaccine 56-40 Years old (1 - PCV) Never done   OPHTHALMOLOGY EXAM  Never done   COVID-19 Vaccine (3 - Booster for Moderna series) 03/30/2020    There are no preventive care reminders to display for this patient.   Lab Results  Component Value Date   TSH 1.880 11/02/2018   Lab Results  Component Value Date   WBC 4.2 04/23/2021   HGB 16.2 04/23/2021   HCT 47.8 04/23/2021   MCV 87 04/23/2021   PLT 133 (L) 04/23/2021   Lab Results  Component Value Date   NA 138 04/23/2021   K 4.1 04/23/2021   CO2 24 04/23/2021   GLUCOSE 182 (H) 04/23/2021   BUN 10 04/23/2021   CREATININE 0.68 (L) 04/23/2021   BILITOT 1.3 (H) 04/23/2021   ALKPHOS 97 04/23/2021   AST 23 04/23/2021   ALT 26 04/23/2021   PROT 7.1 04/23/2021   ALBUMIN 4.1 04/23/2021   CALCIUM 8.8 04/23/2021   EGFR 120 04/23/2021   Lab Results  Component Value Date   CHOL 158 04/23/2021   Lab Results  Component Value Date   HDL 30 (L) 04/23/2021   Lab Results  Component Value Date   LDLCALC 109 (H) 04/23/2021   Lab Results  Component Value Date   TRIG 102 04/23/2021   Lab Results  Component Value Date   CHOLHDL 5.3 (H) 04/23/2021   Lab Results  Component Value Date   HGBA1C 7.8 (H) 07/25/2021       Assessment & Plan:   Jack Porter was seen today for foot pain.  Diagnoses and all orders for this visit:  Pain of right heel Discussed plantar fasciitis. Discussed stretching, exercises, supportive shoes, inserts. Voltaren and prednisone pack as below. Do not take other NSAIDs with Voltaren. Discussed may need referral to podiatry or foot injections if no improvement.  -     diclofenac (VOLTAREN)  75 MG EC tablet; Take 1 tablet (75  mg total) by mouth 2 (two) times daily. -     predniSONE (STERAPRED UNI-PAK 21 TAB) 10 MG (21) TBPK tablet; Use as directed on back of pill pack  Return to office for new or worsening symptoms, or if symptoms persist.   The patient indicates understanding of these issues and agrees with the plan.  Gwenlyn Perking, FNP

## 2021-08-16 NOTE — Patient Instructions (Signed)
Plantar Fasciitis  Plantar fasciitis is a painful foot condition that affects the heel. It occurs when the band of tissue that connects the toes to the heel bone (plantar fascia) becomes irritated. This can happen as the result of exercising too much or doing other repetitive activities (overuse injury). Plantar fasciitis can cause mild irritation to severe pain that makes it difficult to walk or move. The pain is usually worse in the morning after sleeping, or after sitting or lying down for a period of time. Pain may also beworse after long periods of walking or standing. What are the causes? This condition may be caused by: Standing for long periods of time. Wearing shoes that do not have good arch support. Doing activities that put stress on joints (high-impact activities). This includes ballet and exercise that makes your heart beat faster (aerobic exercise), such as running. Being overweight. An abnormal way of walking (gait). Tight muscles in the back of your lower leg (calf). High arches in your feet or flat feet. Starting a new athletic activity. What are the signs or symptoms? The main symptom of this condition is heel pain. Pain may get worse after the following: Taking the first steps after a time of rest, especially in the morning after awakening, or after you have been sitting or lying down for a while. Long periods of standing still. Pain may decrease after 30-45 minutes of activity, such as gentle walking. How is this diagnosed? This condition may be diagnosed based on your medical history, a physical exam, and your symptoms. Your health care provider will check for: A tender area on the bottom of your foot. A high arch in your foot or flat feet. Pain when you move your foot. Difficulty moving your foot. You may have imaging tests to confirm the diagnosis, such as: X-rays. Ultrasound. MRI. How is this treated? Treatment for plantar fasciitis depends on how severe your  condition is. Treatment may include: Rest, ice, pressure (compression), and raising (elevating) the affected foot. This is called RICE therapy. Your health care provider may recommend RICE therapy along with over-the-counter pain medicines to manage your pain. Exercises to stretch your calves and your plantar fascia. A splint that holds your foot in a stretched, upward position while you sleep (night splint). Physical therapy to relieve symptoms and prevent problems in the future. Injections of steroid medicine (cortisone) to relieve pain and inflammation. Stimulating your plantar fascia with electrical impulses (extracorporeal shock wave therapy). This is usually the last treatment option before surgery. Surgery, if other treatments have not worked after 12 months. Follow these instructions at home: Managing pain, stiffness, and swelling  If directed, put ice on the painful area. To do this: Put ice in a plastic bag, or use a frozen bottle of water. Place a towel between your skin and the bag or bottle. Roll the bottom of your foot over the bag or bottle. Do this for 20 minutes, 2-3 times a day. Wear athletic shoes that have air-sole or gel-sole cushions, or try soft shoe inserts that are designed for plantar fasciitis. Elevate your foot above the level of your heart while you are sitting or lying down.  Activity Avoid activities that cause pain. Ask your health care provider what activities are safe for you. Do physical therapy exercises and stretches as told by your health care provider. Try activities and forms of exercise that are easier on your joints (low impact). Examples include swimming, water aerobics, and biking. General instructions Take over-the-counter   and prescription medicines only as told by your health care provider. Wear a night splint while sleeping, if told by your health care provider. Loosen the splint if your toes tingle, become numb, or turn cold and blue. Maintain  a healthy weight, or work with your health care provider to lose weight as needed. Keep all follow-up visits. This is important. Contact a health care provider if you have: Symptoms that do not go away with home treatment. Pain that gets worse. Pain that affects your ability to move or do daily activities. Summary Plantar fasciitis is a painful foot condition that affects the heel. It occurs when the band of tissue that connects the toes to the heel bone (plantar fascia) becomes irritated. Heel pain is the main symptom of this condition. It may get worse after exercising too much or standing still for a long time. Treatment varies, but it usually starts with rest, ice, pressure (compression), and raising (elevating) the affected foot. This is called RICE therapy. Over-the-counter medicines can also be used to manage pain. This information is not intended to replace advice given to you by your health care provider. Make sure you discuss any questions you have with your healthcare provider. Document Revised: 01/10/2020 Document Reviewed: 01/10/2020 Elsevier Patient Education  2022 [employer]  

## 2021-10-24 ENCOUNTER — Ambulatory Visit (INDEPENDENT_AMBULATORY_CARE_PROVIDER_SITE_OTHER): Payer: BC Managed Care – PPO | Admitting: Nurse Practitioner

## 2021-10-24 ENCOUNTER — Encounter: Payer: Self-pay | Admitting: Nurse Practitioner

## 2021-10-24 VITALS — BP 124/78 | HR 97 | Temp 98.3°F | Ht 74.0 in | Wt 314.2 lb

## 2021-10-24 DIAGNOSIS — E1169 Type 2 diabetes mellitus with other specified complication: Secondary | ICD-10-CM

## 2021-10-24 DIAGNOSIS — E669 Obesity, unspecified: Secondary | ICD-10-CM

## 2021-10-24 DIAGNOSIS — M5489 Other dorsalgia: Secondary | ICD-10-CM

## 2021-10-24 DIAGNOSIS — E119 Type 2 diabetes mellitus without complications: Secondary | ICD-10-CM

## 2021-10-24 DIAGNOSIS — E782 Mixed hyperlipidemia: Secondary | ICD-10-CM

## 2021-10-24 DIAGNOSIS — E781 Pure hyperglyceridemia: Secondary | ICD-10-CM | POA: Diagnosis not present

## 2021-10-24 LAB — BAYER DCA HB A1C WAIVED: HB A1C (BAYER DCA - WAIVED): 9 % — ABNORMAL HIGH (ref 4.8–5.6)

## 2021-10-24 MED ORDER — CYCLOBENZAPRINE HCL 5 MG PO TABS: 5.0000 mg | ORAL_TABLET | Freq: Three times a day (TID) | ORAL | 1 refills | Status: DC | PRN

## 2021-10-24 MED ORDER — IBUPROFEN 600 MG PO TABS: 600.0000 mg | ORAL_TABLET | Freq: Three times a day (TID) | ORAL | 0 refills | Status: DC | PRN

## 2021-10-24 NOTE — Progress Notes (Signed)
Established Patient Office Visit  Subjective:  Patient ID: Jack Porter, male    DOB: 1980-12-06  Age: 41 y.o. MRN: [id number]  CC:  Chief Complaint  Patient presents with   Medical Management of Chronic Issues   Diabetes   Hyperlipidemia    HPI Jack Porter presents with history of type 2 diabetes mellitus without complications. Patient was diagnosed in 11/02/2018. Compliance with treatment has been good; the patient takes medication as directed , maintains a diabetic diet and an exercise regimen , follows up as directed , and is keeping a glucose diary. Patient specifically denies associated symptoms, including blurred vision, fatigue, polydipsia, polyphagia and polyuria . Patient denies hypoglycemia. In regard to preventative care, the patient performs foot self-exams daily and last ophthalmology exam was is  scheduled  Back Pain: Patient presents for presents evaluation of low back problems.  Symptoms have been present for a few weeks. Initial inciting event: none. Symptoms are worst: morning, mid-day, afternoon, evening. Alleviating factors identifiable by patient are bending backwards, bending forwards, standing, and walking. Exacerbating factors identifiable by patient are none. Treatments so far initiated by patient: none Previous lower back problems: none. Previous workup: none. Previous treatments: none.   Mixed hyperlipidemia  Pt presents with hyperlipidemia. Patient was diagnosed in 11/02/2018 Compliance with treatment has been good; The patient is compliant with medications, maintains a low cholesterol diet , follows up as directed , and maintains an exercise regimen . The patient denies experiencing any hypercholesterolemia related symptoms.    Past Medical History:  Diagnosis Date   Diabetes mellitus without complication (HCC)    GERD (gastroesophageal reflux disease)    H/O hiatal hernia     Past Surgical History:  Procedure Laterality Date   CHOLECYSTECTOMY   01/21/2014   CHOLECYSTECTOMY N/A 01/21/2014   Procedure: LAPAROSCOPIC CHOLECYSTECTOMY;  Surgeon: Gayland Curry, MD;  Location: [facility];  Service: General;  Laterality: N/A;    Family History  Problem Relation Age of Onset   Diabetes Mother    Heart disease Sister    Diabetes Sister     Social History   Socioeconomic History   Marital status: Married    Spouse name: Not on file   Number of children: Not on file   Years of education: Not on file   Highest education level: Not on file  Occupational History   Not on file  Tobacco Use   Smoking status: Former    Packs/day: 0.25    Years: 3.00    Pack years: 0.75    Types: Cigarettes   Smokeless tobacco: Never  Vaping Use   Vaping Use: Never used  Substance and Sexual Activity   Alcohol use: No    Comment: rarely   Drug use: No   Sexual activity: Yes  Other Topics Concern   Not on file  Social History Narrative   Not on file   Social Determinants of Health   Financial Resource Strain: Not on file  Food Insecurity: Not on file  Transportation Needs: Not on file  Physical Activity: Not on file  Stress: Not on file  Social Connections: Not on file  Intimate Partner Violence: Not on file    Outpatient Medications Prior to Visit  Medication Sig Dispense Refill   blood glucose meter kit and supplies KIT Dispense based on patient and insurance preference. Use up to four times daily as directed. (FOR ICD-9 250.00, 250.01). 1 each 0   glucose blood (IGLUCOSE TEST STRIPS)  test strip Use as instructed 100 each 12   glyBURIDE-metformin (GLUCOVANCE) 5-500 MG tablet Take 1 tablet by mouth daily with breakfast. 90 tablet 3   pioglitazone (ACTOS) 30 MG tablet Take 30 mg by mouth daily.     diclofenac (VOLTAREN) 75 MG EC tablet Take 1 tablet (75 mg total) by mouth 2 (two) times daily. 30 tablet 0   ibuprofen (ADVIL) 800 MG tablet Take 800 mg by mouth every 8 (eight) hours as needed.     predniSONE (STERAPRED UNI-PAK 21 TAB) 10 MG  (21) TBPK tablet Use as directed on back of pill pack 21 tablet 0   No facility-administered medications prior to visit.    No Known Allergies  ROS Review of Systems  Constitutional: Negative.   HENT: Negative.    Eyes: Negative.   Respiratory: Negative.    Gastrointestinal: Negative.  Negative for abdominal distention.  Genitourinary: Negative.   Musculoskeletal: Negative.   Skin: Negative.  Negative for rash.  All other systems reviewed and are negative.    Objective:    Physical Exam Vitals and nursing note reviewed.  Constitutional:      Appearance: Normal appearance. He is obese.  HENT:     Head: Normocephalic.     Right Ear: External ear normal.     Left Ear: External ear normal.     Nose: Nose normal.     Mouth/Throat:     Pharynx: Oropharynx is clear.  Eyes:     Conjunctiva/sclera: Conjunctivae normal.  Cardiovascular:     Rate and Rhythm: Normal rate and regular rhythm.     Pulses: Normal pulses.     Heart sounds: Normal heart sounds.  Pulmonary:     Effort: Pulmonary effort is normal.     Breath sounds: Normal breath sounds.  Abdominal:     General: Bowel sounds are normal.  Musculoskeletal:        General: Normal range of motion.  Skin:    General: Skin is warm.     Findings: No rash.  Neurological:     Mental Status: He is alert and oriented to person, place, and time.  Psychiatric:        Behavior: Behavior normal.    BP 124/78    Pulse 97    Temp 98.3 F (36.8 C) (Temporal)    Ht 6' 2" (1.88 m)    Wt (!) 314 lb 4 oz (142.5 kg)    BMI 40.35 kg/m  Wt Readings from Last 3 Encounters:  10/24/21 (!) 314 lb 4 oz (142.5 kg)  08/16/21 (!) 317 lb 6 oz (144 kg)  07/25/21 (!) 313 lb (142 kg)     Health Maintenance Due  Topic Date Due   OPHTHALMOLOGY EXAM  Never done    There are no preventive care reminders to display for this patient.  Lab Results  Component Value Date   TSH 1.880 11/02/2018   Lab Results  Component Value Date   WBC  4.2 04/23/2021   HGB 16.2 04/23/2021   HCT 47.8 04/23/2021   MCV 87 04/23/2021   PLT 133 (L) 04/23/2021   Lab Results  Component Value Date   NA 138 04/23/2021   K 4.1 04/23/2021   CO2 24 04/23/2021   GLUCOSE 182 (H) 04/23/2021   BUN 10 04/23/2021   CREATININE 0.68 (L) 04/23/2021   BILITOT 1.3 (H) 04/23/2021   ALKPHOS 97 04/23/2021   AST 23 04/23/2021   ALT 26 04/23/2021   PROT 7.1 04/23/2021  ALBUMIN 4.1 04/23/2021   CALCIUM 8.8 04/23/2021   EGFR 120 04/23/2021   Lab Results  Component Value Date   CHOL 158 04/23/2021   Lab Results  Component Value Date   HDL 30 (L) 04/23/2021   Lab Results  Component Value Date   LDLCALC 109 (H) 04/23/2021   Lab Results  Component Value Date   TRIG 102 04/23/2021   Lab Results  Component Value Date   CHOLHDL 5.3 (H) 04/23/2021   Lab Results  Component Value Date   HGBA1C 9.0 (H) 10/24/2021      Assessment & Plan:   Problem List Items Addressed This Visit       Endocrine   Type 2 diabetes mellitus with hypertriglyceridemia ([place]) - Primary   Relevant Orders   CBC with Differential/Platelet   CMP14+EGFR   Lipid panel   Bayer DCA Hb A1c Waived (Completed)   CBC with Differential   Comprehensive metabolic panel   Lipid Panel   Type 2 diabetes mellitus without complication, without long-term current use of insulin ([place])    Patient's diabetes is not well controlled, completed A1c, cbc, cmp, lipid panel I will make adjustments to medication with lab results        Other   Obesity (BMI 30-39.9)    Patient is working on weight loss and has lost 14 lb in the past 1 month. Patient is on a weight loss program at work with the collaboration of a nutritionist.       Mixed hyperlipidemia    Completed labs results pending.       Other Visit Diagnoses     Back pain without sciatica       Relevant Medications   ibuprofen (ADVIL) 600 MG tablet   cyclobenzaprine (FLEXERIL) 5 MG tablet       Meds ordered this  encounter  Medications   ibuprofen (ADVIL) 600 MG tablet    Sig: Take 1 tablet (600 mg total) by mouth every 8 (eight) hours as needed.    Dispense:  30 tablet    Refill:  0    Order Specific Question:   Supervising Provider    Answer:   Claretta Fraise [[id number]]   cyclobenzaprine (FLEXERIL) 5 MG tablet    Sig: Take 1 tablet (5 mg total) by mouth 3 (three) times daily as needed for muscle spasms.    Dispense:  30 tablet    Refill:  1    Order Specific Question:   Supervising Provider    Answer:   Claretta Fraise [[id number]]    Follow-up: Return in about 3 months (around 01/22/2022).    Ivy Lynn, NP

## 2021-10-24 NOTE — Assessment & Plan Note (Signed)
Patient's diabetes is not well controlled, completed A1c, cbc, cmp, lipid panel I will make adjustments to medication with lab results

## 2021-10-24 NOTE — Assessment & Plan Note (Signed)
Completed labs results pending. 

## 2021-10-24 NOTE — Assessment & Plan Note (Signed)
Patient is working on weight loss and has lost 14 lb in the past 1 month. Patient is on a weight loss program at work with the collaboration of a nutritionist.

## 2021-10-24 NOTE — Patient Instructions (Addendum)
Hypertension, Adult °Hypertension is another name for high blood pressure. High blood pressure forces your heart to work harder to pump blood. This can cause problems over time. °There are two numbers in a blood pressure reading. There is a top number (systolic) over a bottom number (diastolic). It is best to have a blood pressure that is below 120/80. Healthy choices can help lower your blood pressure, or you may need medicine to help lower it. °What are the causes? °The cause of this condition is not known. Some conditions may be related to high blood pressure. °What increases the risk? °Smoking. °Having type 2 diabetes mellitus, high cholesterol, or both. °Not getting enough exercise or physical activity. °Being overweight. °Having too much fat, sugar, calories, or salt (sodium) in your diet. °Drinking too much alcohol. °Having long-term (chronic) kidney disease. °Having a family history of high blood pressure. °Age. Risk increases with age. °Race. You may be at higher risk if you are African American. °Gender. Men are at higher risk than women before age 45. After age 65, women are at higher risk than men. °Having obstructive sleep apnea. °Stress. °What are the signs or symptoms? °High blood pressure may not cause symptoms. Very high blood pressure (hypertensive crisis) may cause: °Headache. °Feelings of worry or nervousness (anxiety). °Shortness of breath. °Nosebleed. °A feeling of being sick to your stomach (nausea). °Throwing up (vomiting). °Changes in how you see. °Very bad chest pain. °Seizures. °How is this treated? °This condition is treated by making healthy lifestyle changes, such as: °Eating healthy foods. °Exercising more. °Drinking less alcohol. °Your health care provider may prescribe medicine if lifestyle changes are not enough to get your blood pressure under control, and if: °Your top number is above 130. °Your bottom number is above 80. °Your personal target blood pressure may vary. °Follow  these instructions at home: °Eating and drinking ° °If told, follow the DASH eating plan. To follow this plan: °Fill one half of your plate at each meal with fruits and vegetables. °Fill one fourth of your plate at each meal with whole grains. Whole grains include whole-wheat pasta, brown rice, and whole-grain bread. °Eat or drink low-fat dairy products, such as skim milk or low-fat yogurt. °Fill one fourth of your plate at each meal with low-fat (lean) proteins. Low-fat proteins include fish, chicken without skin, eggs, beans, and tofu. °Avoid fatty meat, cured and processed meat, or chicken with skin. °Avoid pre-made or processed food. °Eat less than 1,500 mg of salt each day. °Do not drink alcohol if: °Your doctor tells you not to drink. °You are pregnant, may be pregnant, or are planning to become pregnant. °If you drink alcohol: °Limit how much you use to: °0-1 drink a day for women. °0-2 drinks a day for men. °Be aware of how much alcohol is in your drink. In the [place] one drink equals one 12 oz bottle of beer (355 mL), one 5 oz glass of wine (148 mL), or one 1½ oz glass of hard liquor (44 mL). °Lifestyle ° °Work with your doctor to stay at a healthy weight or to lose weight. Ask your doctor what the best weight is for you. °Get at least 30 minutes of exercise most days of the week. This may include walking, swimming, or biking. °Get at least 30 minutes of exercise that strengthens your muscles (resistance exercise) at least 3 days a week. This may include lifting weights or doing Pilates. °Do not use any products that contain nicotine or tobacco, such   as cigarettes, e-cigarettes, and chewing tobacco. If you need help quitting, ask your doctor. Check your blood pressure at home as told by your doctor. Keep all follow-up visits as told by your doctor. This is important. Medicines Take over-the-counter and prescription medicines only as told by your doctor. Follow directions carefully. Do not skip doses of  blood pressure medicine. The medicine does not work as well if you skip doses. Skipping doses also puts you at risk for problems. Ask your doctor about side effects or reactions to medicines that you should watch for. Contact a doctor if you: Think you are having a reaction to the medicine you are taking. Have headaches that keep coming back (recurring). Feel dizzy. Have swelling in your ankles. Have trouble with your vision. Get help right away if you: Get a very bad headache. Start to feel mixed up (confused). Feel weak or numb. Feel faint. Have very bad pain in your: Chest. Belly (abdomen). Throw up more than once. Have trouble breathing. Summary Hypertension is another name for high blood pressure. High blood pressure forces your heart to work harder to pump blood. For most people, a normal blood pressure is less than 120/80. Making healthy choices can help lower blood pressure. If your blood pressure does not get lower with healthy choices, you may need to take medicine. This information is not intended to replace advice given to you by your health care provider. Make sure you discuss any questions you have with your health care provider. Document Revised: 06/03/2018 Document Reviewed: 06/03/2018 Elsevier Patient Education  [place]. Cholesterol Content in Foods Cholesterol is a waxy, fat-like substance that helps to carry fat in the blood. The body needs cholesterol in small amounts, but too much cholesterol can cause damage to the arteries and heart. What foods have cholesterol? Cholesterol is found in animal-based foods, such as meat, seafood, and dairy. Generally, low-fat dairy and lean meats have less cholesterol than full-fat dairy and fatty meats. The milligrams of cholesterol per serving (mg per serving) of common cholesterol-containing foods are listed below. Meats and other proteins Egg -- one large whole egg has 186 mg. Veal shank -- 4 oz (113 g) has 141  mg. Lean ground [place] (93% lean) -- 4 oz (113 g) has 118 mg. Fat-trimmed lamb loin -- 4 oz (113 g) has 106 mg. Lean ground beef (90% lean) -- 4 oz (113 g) has 100 mg. Lobster -- 3.5 oz (99 g) has 90 mg. Pork loin chops -- 4 oz (113 g) has 86 mg. Canned salmon -- 3.5 oz (99 g) has 83 mg. Fat-trimmed beef top loin -- 4 oz (113 g) has 78 mg. Frankfurter -- 1 frank (3.5 oz or 99 g) has 77 mg. Crab -- 3.5 oz (99 g) has 71 mg. Roasted chicken without skin, white meat -- 4 oz (113 g) has 66 mg. Light bologna -- 2 oz (57 g) has 45 mg. Deli-cut [place] -- 2 oz (57 g) has 31 mg. Canned tuna -- 3.5 oz (99 g) has 31 mg. Berniece [place] -- 1 oz (28 g) has 29 mg. Oysters and mussels (raw) -- 3.5 oz (99 g) has 25 mg. Mackerel -- 1 oz (28 g) has 22 mg. Trout -- 1 oz (28 g) has 20 mg. Pork sausage -- 1 link (1 oz or 28 g) has 17 mg. Salmon -- 1 oz (28 g) has 16 mg. Tilapia -- 1 oz (28 g) has 14 mg. Dairy Soft-serve ice cream --  cup (  4 oz or 86 g) has 103 mg. Whole-milk yogurt -- 1 cup (8 oz or 245 g) has 29 mg. Cheddar cheese -- 1 oz (28 g) has 28 mg. American cheese -- 1 oz (28 g) has 28 mg. Whole milk -- 1 cup (8 oz or 250 mL) has 23 mg. 2% milk -- 1 cup (8 oz or 250 mL) has 18 mg. Cream cheese -- 1 tablespoon (Tbsp) (14.5 g) has 15 mg. Cottage cheese --  cup (4 oz or 113 g) has 14 mg. Low-fat (1%) milk -- 1 cup (8 oz or 250 mL) has 10 mg. Sour cream -- 1 Tbsp (12 g) has 8.5 mg. Low-fat yogurt -- 1 cup (8 oz or 245 g) has 8 mg. Nonfat Greek yogurt -- 1 cup (8 oz or 228 g) has 7 mg. Half-and-half cream -- 1 Tbsp (15 mL) has 5 mg. Fats and oils Cod liver oil -- 1 tablespoon (Tbsp) (13.6 g) has 82 mg. Butter -- 1 Tbsp (14 g) has 15 mg. Lard -- 1 Tbsp (12.8 g) has 14 mg. Bacon grease -- 1 Tbsp (12.9 g) has 14 mg. Mayonnaise -- 1 Tbsp (13.8 g) has 5-10 mg. Margarine -- 1 Tbsp (14 g) has 3-10 mg. The items listed above may not be a complete list of foods with cholesterol. Exact amounts of cholesterol  in these foods may vary depending on specific ingredients and brands. Contact a dietitian for more information. What foods do not have cholesterol? Most plant-based foods do not have cholesterol unless you combine them with a food that has cholesterol. Foods without cholesterol include: Grains and cereals. Vegetables. Fruits. Vegetable oils, such as olive, canola, and sunflower oil. Legumes, such as peas, beans, and lentils. Nuts and seeds. Egg whites. The items listed above may not be a complete list of foods that do not have cholesterol. Contact a dietitian for more information. Summary The body needs cholesterol in small amounts, but too much cholesterol can cause damage to the arteries and heart. Cholesterol is found in animal-based foods, such as meat, seafood, and dairy. Generally, low-fat dairy and lean meats have less cholesterol than full-fat dairy and fatty meats. This information is not intended to replace advice given to you by your health care provider. Make sure you discuss any questions you have with your health care provider. Document Revised: 02/02/2021 Document Reviewed: 02/02/2021 Elsevier Patient Education  [place]. Diabetes Mellitus Basics Diabetes mellitus, or diabetes, is a long-term (chronic) disease. It occurs when the body does not properly use sugar (glucose) that is released from food after you eat. Diabetes mellitus may be caused by one or both of these problems: Your pancreas does not make enough of a hormone called insulin. Your body does not react in a normal way to the insulin that it makes. Insulin lets glucose enter cells in your body. This gives you energy. If you have diabetes, glucose cannot get into cells. This causes high blood glucose (hyperglycemia). How to treat and manage diabetes You may need to take insulin or other diabetes medicines daily to keep your glucose in balance. If you are prescribed insulin, you will learn how to give  yourself insulin by injection. You may need to adjust the amount of insulin you take based on the foods that you eat. You will need to check your blood glucose levels using a glucose monitor as told by your health care provider. The readings can help determine if you have low or high blood glucose.  Generally, you should have these blood glucose levels: Before meals (preprandial): 80-130 mg/dL (4.4-7.2 mmol/L). After meals (postprandial): below 180 mg/dL (10 mmol/L). Hemoglobin A1c (HbA1c) level: less than 7%. Your health care provider will set treatment goals for you. Keep all follow-up visits. This is important. Follow these instructions at home: Diabetes medicines Take your diabetes medicines every day as told by your health care provider. List your diabetes medicines here: Name of medicine: ______________________________ Amount (dose): _______________ Time (a.m./p.m.): _______________ Notes: ___________________________________ Name of medicine: ______________________________ Amount (dose): _______________ Time (a.m./p.m.): _______________ Notes: ___________________________________ Name of medicine: ______________________________ Amount (dose): _______________ Time (a.m./p.m.): _______________ Notes: ___________________________________ Insulin If you use insulin, list the types of insulin you use here: Insulin type: ______________________________ Amount (dose): _______________ Time (a.m./p.m.): _______________Notes: ___________________________________ Insulin type: ______________________________ Amount (dose): _______________ Time (a.m./p.m.): _______________ Notes: ___________________________________ Insulin type: ______________________________ Amount (dose): _______________ Time (a.m./p.m.): _______________ Notes: ___________________________________ Insulin type: ______________________________ Amount (dose): _______________ Time (a.m./p.m.): _______________ Notes:  ___________________________________ Insulin type: ______________________________ Amount (dose): _______________ Time (a.m./p.m.): _______________ Notes: ___________________________________ Managing blood glucose Check your blood glucose levels using a glucose monitor as told by your health care provider. Write down the times that you check your glucose levels here: Time: _______________ Notes: ___________________________________ Time: _______________ Notes: ___________________________________ Time: _______________ Notes: ___________________________________ Time: _______________ Notes: ___________________________________ Time: _______________ Notes: ___________________________________ Time: _______________ Notes: ___________________________________  Low blood glucose Low blood glucose (hypoglycemia) is when glucose is at or below 70 mg/dL (3.9 mmol/L). Symptoms may include: Feeling: Hungry. Sweaty and clammy. Irritable or easily upset. Dizzy. Sleepy. Having: A fast heartbeat. A headache. A change in your vision. Numbness around the mouth, lips, or tongue. Having trouble with: Moving (coordination). Sleeping. Treating low blood glucose To treat low blood glucose, eat or drink something containing sugar right away. If you can think clearly and swallow safely, follow the 15:15 rule: Take 15 grams of a fast-acting carb (carbohydrate), as told by your health care provider. Some fast-acting carbs are: Glucose tablets: take 3-4 tablets. Hard candy: eat 3-5 pieces. Fruit juice: drink 4 oz (120 mL). Regular (not diet) soda: drink 4-6 oz (120-180 mL). Honey or sugar: eat 1 Tbsp (15 mL). Check your blood glucose levels 15 minutes after you take the carb. If your glucose is still at or below 70 mg/dL (3.9 mmol/L), take 15 grams of a carb again. If your glucose does not go above 70 mg/dL (3.9 mmol/L) after 3 tries, get help right away. After your glucose goes back to normal, eat a meal or  a snack within 1 hour. Treating very low blood glucose If your glucose is at or below 54 mg/dL (3 mmol/L), you have very low blood glucose (severe hypoglycemia). This is an emergency. Do not wait to see if the symptoms will go away. Get medical help right away. Call your local emergency services (911 in the [place] Do not drive yourself to the hospital. Questions to ask your health care provider Should I talk with a diabetes educator? What equipment will I need to care for myself at home? What diabetes medicines do I need? When should I take them? How often do I need to check my blood glucose levels? What number can I call if I have questions? When is my follow-up visit? Where can I find a support group for people with diabetes? Where to find more information American Diabetes Association: www.diabetes.org Association of Diabetes Care and Education Specialists: www.diabeteseducator.org Contact a health care provider if: Your blood glucose is at or above 240 mg/dL (13.3  mmol/L) for 2 days in a row. You have been sick or have had a fever for 2 days or more, and you are not getting better. You have any of these problems for more than 6 hours: You cannot eat or drink. You feel nauseous. You vomit. You have diarrhea. Get help right away if: Your blood glucose is lower than 54 mg/dL (3 mmol/L). You get confused. You have trouble thinking clearly. You have trouble breathing. These symptoms may represent a serious problem that is an emergency. Do not wait to see if the symptoms will go away. Get medical help right away. Call your local emergency services (911 in the [place] Do not drive yourself to the hospital. Summary Diabetes mellitus is a chronic disease that occurs when the body does not properly use sugar (glucose) that is released from food after you eat. Take insulin and diabetes medicines as told. Check your blood glucose every day, as often as told. Keep all follow-up visits. This is  important. This information is not intended to replace advice given to you by your health care provider. Make sure you discuss any questions you have with your health care provider. Document Revised: 01/25/2020 Document Reviewed: 01/25/2020 Elsevier Patient Education  [place].

## 2021-10-25 LAB — CMP14+EGFR
ALT: 34 IU/L (ref 0–44)
AST: 33 IU/L (ref 0–40)
Albumin/Globulin Ratio: 1.4 (ref 1.2–2.2)
Albumin: 4.2 g/dL (ref 4.0–5.0)
Alkaline Phosphatase: 101 IU/L (ref 44–121)
BUN/Creatinine Ratio: 17 (ref 9–20)
BUN: 11 mg/dL (ref 6–24)
Bilirubin Total: 1.2 mg/dL (ref 0.0–1.2)
CO2: 25 mmol/L (ref 20–29)
Calcium: 9.1 mg/dL (ref 8.7–10.2)
Chloride: 104 mmol/L (ref 96–106)
Creatinine, Ser: 0.64 mg/dL — ABNORMAL LOW (ref 0.76–1.27)
Globulin, Total: 3 g/dL (ref 1.5–4.5)
Glucose: 201 mg/dL — ABNORMAL HIGH (ref 70–99)
Potassium: 4.6 mmol/L (ref 3.5–5.2)
Sodium: 140 mmol/L (ref 134–144)
Total Protein: 7.2 g/dL (ref 6.0–8.5)
eGFR: 122 mL/min/{1.73_m2} (ref >59)

## 2021-10-25 LAB — CBC WITH DIFFERENTIAL/PLATELET
Basophils Absolute: 0 10*3/uL (ref 0.0–0.2)
Basos: 1 %
EOS (ABSOLUTE): 0.2 10*3/uL (ref 0.0–0.4)
Eos: 3 %
Hematocrit: 48.9 % (ref 37.5–51.0)
Hemoglobin: 16.5 g/dL (ref 13.0–17.7)
Immature Grans (Abs): 0 10*3/uL (ref 0.0–0.1)
Immature Granulocytes: 0 %
Lymphocytes Absolute: 2.4 10*3/uL (ref 0.7–3.1)
Lymphs: 43 %
MCH: 29.6 pg (ref 26.6–33.0)
MCHC: 33.7 g/dL (ref 31.5–35.7)
MCV: 88 fL (ref 79–97)
Monocytes Absolute: 0.3 10*3/uL (ref 0.1–0.9)
Monocytes: 6 %
Neutrophils Absolute: 2.7 10*3/uL (ref 1.4–7.0)
Neutrophils: 47 %
Platelets: 142 10*3/uL — ABNORMAL LOW (ref 150–450)
RBC: 5.57 x10E6/uL (ref 4.14–5.80)
RDW: 13.1 % (ref 11.6–15.4)
WBC: 5.6 10*3/uL (ref 3.4–10.8)

## 2021-10-25 LAB — LIPID PANEL
Chol/HDL Ratio: 5.8 ratio — ABNORMAL HIGH (ref 0.0–5.0)
Cholesterol, Total: 161 mg/dL (ref 100–199)
HDL: 28 mg/dL — ABNORMAL LOW (ref >39)
LDL Chol Calc (NIH): 95 mg/dL (ref 0–99)
Triglycerides: 221 mg/dL — ABNORMAL HIGH (ref 0–149)
VLDL Cholesterol Cal: 38 mg/dL (ref 5–40)

## 2021-10-29 ENCOUNTER — Other Ambulatory Visit: Payer: Self-pay | Admitting: Nurse Practitioner

## 2021-10-29 DIAGNOSIS — E782 Mixed hyperlipidemia: Secondary | ICD-10-CM

## 2021-10-29 MED ORDER — ROSUVASTATIN CALCIUM 5 MG PO TABS: 5.0000 mg | ORAL_TABLET | Freq: Every day | ORAL | 1 refills | Status: DC

## 2021-12-19 DIAGNOSIS — M5459 Other low back pain: Secondary | ICD-10-CM | POA: Diagnosis not present

## 2021-12-19 DIAGNOSIS — M546 Pain in thoracic spine: Secondary | ICD-10-CM | POA: Diagnosis not present

## 2021-12-19 DIAGNOSIS — M5136 Other intervertebral disc degeneration, lumbar region: Secondary | ICD-10-CM | POA: Diagnosis not present

## 2022-01-05 ENCOUNTER — Other Ambulatory Visit: Payer: Self-pay | Admitting: Family Medicine

## 2022-01-05 DIAGNOSIS — E119 Type 2 diabetes mellitus without complications: Secondary | ICD-10-CM

## 2022-01-07 ENCOUNTER — Other Ambulatory Visit: Payer: Self-pay | Admitting: Family Medicine

## 2022-01-07 DIAGNOSIS — E119 Type 2 diabetes mellitus without complications: Secondary | ICD-10-CM

## 2022-01-18 ENCOUNTER — Ambulatory Visit: Payer: BC Managed Care – PPO | Admitting: Nurse Practitioner

## 2022-02-04 ENCOUNTER — Ambulatory Visit: Payer: BC Managed Care – PPO | Admitting: Nurse Practitioner

## 2022-02-04 ENCOUNTER — Other Ambulatory Visit: Payer: Self-pay | Admitting: Nurse Practitioner

## 2022-02-04 ENCOUNTER — Encounter: Payer: Self-pay | Admitting: Nurse Practitioner

## 2022-02-04 VITALS — BP 118/81 | HR 105 | Temp 98.0°F | Resp 20 | Ht 74.0 in | Wt 314.0 lb

## 2022-02-04 DIAGNOSIS — E782 Mixed hyperlipidemia: Secondary | ICD-10-CM

## 2022-02-04 DIAGNOSIS — E119 Type 2 diabetes mellitus without complications: Secondary | ICD-10-CM

## 2022-02-04 DIAGNOSIS — E1169 Type 2 diabetes mellitus with other specified complication: Secondary | ICD-10-CM | POA: Diagnosis not present

## 2022-02-04 DIAGNOSIS — Z0000 Encounter for general adult medical examination without abnormal findings: Secondary | ICD-10-CM

## 2022-02-04 DIAGNOSIS — E781 Pure hyperglyceridemia: Secondary | ICD-10-CM | POA: Diagnosis not present

## 2022-02-04 MED ORDER — GLYBURIDE-METFORMIN 5-500 MG PO TABS: 1.0000 | ORAL_TABLET | Freq: Every day | ORAL | 1 refills | Status: DC

## 2022-02-04 MED ORDER — ROSUVASTATIN CALCIUM 5 MG PO TABS: 5.0000 mg | ORAL_TABLET | Freq: Every day | ORAL | 1 refills | Status: DC

## 2022-02-04 NOTE — Progress Notes (Signed)
? ?Established Patient Office Visit ? ?Subjective   ?Patient ID: Jack Porter, male    DOB: 1981/03/02  Age: 41 y.o. MRN: [id number] ? ?Chief Complaint  ?Patient presents with  ? Medical Management of Chronic Issues  ?  3 mo   ? ? ?HPI ?The patient presents with history of  type 2 diabetes mellitus without complications. Patient was diagnosed in 11/02/2018. Compliance with treatment has been good; the patient takes medication as directed , maintains a diabetic diet and an exercise regimen , follows up as directed , and is keeping a glucose diary. Sugars runs 180-200. Patient specifically denies associated symptoms, including blurred vision, fatigue, polydipsia, polyphagia and polyuria . Patient denies hypoglycemia. In regard to preventative care, the patient performs foot self-exams daily and last ophthalmology exam was in last year.  ? ? ?Mixed hyperlipidemia  ?Pt presents with hyperlipidemia. Patient was diagnosed in 12/28/2020. Compliance with treatment has been good; The patient is compliant with medications, maintains a low cholesterol diet , follows up as directed , and maintains an exercise regimen . The patient denies experiencing any hypercholesterolemia related symptoms.   ? ? ?Patient Active Problem List  ? Diagnosis Date Noted  ? Right foot pain 02/20/2021  ? Mixed hyperlipidemia 12/28/2020  ? Elevated liver enzymes 12/14/2018  ? Type 2 diabetes mellitus without complication, without long-term current use of insulin ([place]) 11/02/2018  ? Obesity (BMI 30-39.9) 11/02/2018  ? Plantar fasciitis of left foot 11/02/2018  ? Type 2 diabetes mellitus with hypertriglyceridemia ([place]) 01/20/2014  ? ?Past Medical History:  ?Diagnosis Date  ? Diabetes mellitus without complication ([place])   ? GERD (gastroesophageal reflux disease)   ? H/O hiatal hernia   ? ?Past Surgical History:  ?Procedure Laterality Date  ? CHOLECYSTECTOMY  01/21/2014  ? CHOLECYSTECTOMY N/A 01/21/2014  ? Procedure: LAPAROSCOPIC CHOLECYSTECTOMY;  Surgeon:  Gayland Curry, MD;  Location: [facility];  Service: General;  Laterality: N/A;  ? ?Social History  ? ?Tobacco Use  ? Smoking status: Former  ?  Packs/day: 0.25  ?  Years: 3.00  ?  Pack years: 0.75  ?  Types: Cigarettes  ? Smokeless tobacco: Never  ?Vaping Use  ? Vaping Use: Never used  ?Substance Use Topics  ? Alcohol use: No  ?  Comment: rarely  ? Drug use: No  ? ?Social History  ? ?Socioeconomic History  ? Marital status: Married  ?  Spouse name: Not on file  ? Number of children: Not on file  ? Years of education: Not on file  ? Highest education level: Not on file  ?Occupational History  ? Not on file  ?Tobacco Use  ? Smoking status: Former  ?  Packs/day: 0.25  ?  Years: 3.00  ?  Pack years: 0.75  ?  Types: Cigarettes  ? Smokeless tobacco: Never  ?Vaping Use  ? Vaping Use: Never used  ?Substance and Sexual Activity  ? Alcohol use: No  ?  Comment: rarely  ? Drug use: No  ? Sexual activity: Yes  ?Other Topics Concern  ? Not on file  ?Social History Narrative  ? Not on file  ? ?Social Determinants of Health  ? ?Financial Resource Strain: Not on file  ?Food Insecurity: Not on file  ?Transportation Needs: Not on file  ?Physical Activity: Not on file  ?Stress: Not on file  ?Social Connections: Not on file  ?Intimate Partner Violence: Not on file  ? ?Family Status  ?Relation Name Status  ? Mother  Alive  ? Father  Alive  ? Sister  Alive  ? Brother  Alive  ? Sister  Alive  ? Sister  Alive  ? Brother  Alive  ? ?Family History  ?Problem Relation Age of Onset  ? Diabetes Mother   ? Heart disease Sister   ? Diabetes Sister   ? ?No Known Allergies ?  ? ?Review of Systems  ?Constitutional: Negative.   ?HENT: Negative.    ?Eyes: Negative.   ?Respiratory: Negative.    ?Cardiovascular: Negative.   ?Gastrointestinal: Negative.   ?Musculoskeletal: Negative.   ?Skin: Negative.   ?Neurological: Negative.   ?Psychiatric/Behavioral: Negative.    ?All other systems reviewed and are negative. ? ?  ?Objective:  ?  ? ?BP 118/81   Pulse (!)  105   Temp 98 ?F (36.7 ?C)   Resp 20   Ht 6' 2"  (1.88 m)   Wt (!) 314 lb (142.4 kg)   SpO2 98%   BMI 40.32 kg/m?  ?BP Readings from Last 3 Encounters:  ?02/04/22 118/81  ?10/24/21 124/78  ?08/16/21 131/78  ? ?Wt Readings from Last 3 Encounters:  ?02/04/22 (!) 314 lb (142.4 kg)  ?10/24/21 (!) 314 lb 4 oz (142.5 kg)  ?08/16/21 (!) 317 lb 6 oz (144 kg)  ? ?  ? ?Physical Exam ?Vitals reviewed.  ?Constitutional:   ?   Appearance: He is obese.  ?HENT:  ?   Head: Normocephalic.  ?   Right Ear: External ear normal.  ?   Left Ear: External ear normal.  ?   Nose: Nose normal.  ?Eyes:  ?   Conjunctiva/sclera: Conjunctivae normal.  ?Cardiovascular:  ?   Rate and Rhythm: Normal rate and regular rhythm.  ?   Pulses: Normal pulses.  ?   Heart sounds: Normal heart sounds.  ?Pulmonary:  ?   Effort: Pulmonary effort is normal.  ?   Breath sounds: Normal breath sounds.  ?Abdominal:  ?   General: Bowel sounds are normal.  ?Musculoskeletal:  ?   Cervical back: Normal range of motion.  ?Skin: ?   General: Skin is warm.  ?   Findings: No rash.  ?Neurological:  ?   General: No focal deficit present.  ?   Mental Status: He is alert and oriented to person, place, and time.  ?Psychiatric:     ?   Behavior: Behavior normal.  ? ? ? ?No results found for any visits on 02/04/22. ? ?Last CBC ?Lab Results  ?Component Value Date  ? WBC 5.6 10/24/2021  ? HGB 16.5 10/24/2021  ? HCT 48.9 10/24/2021  ? MCV 88 10/24/2021  ? MCH 29.6 10/24/2021  ? RDW 13.1 10/24/2021  ? PLT 142 (L) 10/24/2021  ? ?Last metabolic panel ?Lab Results  ?Component Value Date  ? GLUCOSE 201 (H) 10/24/2021  ? NA 140 10/24/2021  ? K 4.6 10/24/2021  ? CL 104 10/24/2021  ? CO2 25 10/24/2021  ? BUN 11 10/24/2021  ? CREATININE 0.64 (L) 10/24/2021  ? EGFR 122 10/24/2021  ? CALCIUM 9.1 10/24/2021  ? PROT 7.2 10/24/2021  ? ALBUMIN 4.2 10/24/2021  ? LABGLOB 3.0 10/24/2021  ? AGRATIO 1.4 10/24/2021  ? BILITOT 1.2 10/24/2021  ? ALKPHOS 101 10/24/2021  ? AST 33 10/24/2021  ? ALT 34  10/24/2021  ? ?Last lipids ?Lab Results  ?Component Value Date  ? CHOL 161 10/24/2021  ? HDL 28 (L) 10/24/2021  ? [place] 95 10/24/2021  ? TRIG 221 (H) 10/24/2021  ? CHOLHDL  5.8 (H) 10/24/2021  ? ?Last hemoglobin A1c ?Lab Results  ?Component Value Date  ? HGBA1C 9.0 (H) 10/24/2021  ? ?Last thyroid functions ?Lab Results  ?Component Value Date  ? TSH 1.880 11/02/2018  ? ?Last vitamin D ?No results found for: 25OHVITD2, 25OHVITD3, VD25OH ?  ? ?The 10-year ASCVD risk score (Arnett DK, et al., 2019) is: 3.1% ? ?  ?Assessment & Plan:  ? ?Problem List Items Addressed This Visit   ? ?  ? Endocrine  ? Type 2 diabetes mellitus with hypertriglyceridemia (HCC) - Primary  ? Relevant Medications  ? glyBURIDE-metformin (GLUCOVANCE) 5-500 MG tablet  ? rosuvastatin (CRESTOR) 5 MG tablet  ? Other Relevant Orders  ? CMP14+EGFR  ? CBC with Differential/Platelet  ? Bayer DCA Hb A1c Waived  ? CBC with Differential  ? Comprehensive metabolic panel  ? Lipid Panel  ? Microalbumin/Creatinine Ratio, Urine  ? Type 2 diabetes mellitus without complication, without long-term current use of insulin ([place])  ?  Completed labs CBC, CMP lipid panel.  A1c-results pending.  Education provided to patient to continue diabetic diet, exercise and weight.  ?Follow-up 2 weeks. ? ?  ?  ? Relevant Medications  ? glyBURIDE-metformin (GLUCOVANCE) 5-500 MG tablet  ? rosuvastatin (CRESTOR) 5 MG tablet  ?  ? Other  ? Mixed hyperlipidemia  ?  Labs completed results pending.  Continue low-cholesterol diet, exercise and weight loss. ? ?  ?  ? Relevant Medications  ? rosuvastatin (CRESTOR) 5 MG tablet  ? Other Relevant Orders  ? CBC with Differential/Platelet  ? Lipid panel  ? ?Other Visit Diagnoses   ? ? Health care maintenance      ? Relevant Orders  ? CMP14+EGFR  ? CBC with Differential/Platelet  ? Thyroid Panel With TSH  ? ?  ? ? ?Return in about 3 months (around 05/07/2022).  ? ? ?Ivy Lynn, NP ? ?

## 2022-02-04 NOTE — Patient Instructions (Signed)
High Cholesterol ? ?High cholesterol is a condition in which the blood has high levels of a white, waxy substance similar to fat (cholesterol). The liver makes all the cholesterol that the body needs. The human body needs small amounts of cholesterol to help build cells. A person gets extra or excess cholesterol from the food that he or she eats. ?The blood carries cholesterol from the liver to the rest of the body. If you have high cholesterol, deposits (plaques) may build up on the walls of your arteries. Arteries are the blood vessels that carry blood away from your heart. These plaques make the arteries narrow and stiff. ?Cholesterol plaques increase your risk for heart attack and stroke. Work with your health care provider to keep your cholesterol levels in a healthy range. ?What increases the risk? ?The following factors may make you more likely to develop this condition: ?Eating foods that are high in animal fat (saturated fat) or cholesterol. ?Being overweight. ?Not getting enough exercise. ?A family history of high cholesterol (familial hypercholesterolemia). ?Use of tobacco products. ?Having diabetes. ?What are the signs or symptoms? ?In most cases, high cholesterol does not usually cause any symptoms. ?In severe cases, very high cholesterol levels can cause: ?Fatty bumps under the skin (xanthomas). ?A white or gray ring around the black center (pupil) of the eye. ?How is this diagnosed? ?This condition may be diagnosed based on the results of a blood test. ?If you are older than 42 years of age, your health care provider may check your cholesterol levels every 4-6 years. ?You may be checked more often if you have high cholesterol or other risk factors for heart disease. ?The blood test for cholesterol measures: ?"Bad" cholesterol, or LDL cholesterol. This is the main type of cholesterol that causes heart disease. The desired level is less than 100 mg/dL (2.59 mmol/L). ?"Good" cholesterol, or HDL  cholesterol. HDL helps protect against heart disease by cleaning the arteries and carrying the LDL to the liver for processing. The desired level for HDL is 60 mg/dL (1.55 mmol/L) or higher. ?Triglycerides. These are fats that your body can store or burn for energy. The desired level is less than 150 mg/dL (1.69 mmol/L). ?Total cholesterol. This measures the total amount of cholesterol in your blood and includes LDL, HDL, and triglycerides. The desired level is less than 200 mg/dL (5.17 mmol/L). ?How is this treated? ?Treatment for high cholesterol starts with lifestyle changes, such as diet and exercise. ?Diet changes. You may be asked to eat foods that have more fiber and less saturated fats or added sugar. ?Lifestyle changes. These may include regular exercise, maintaining a healthy weight, and quitting use of tobacco products. ?Medicines. These are given when diet and lifestyle changes have not worked. You may be prescribed a statin medicine to help lower your cholesterol levels. ?Follow these instructions at home: ?Eating and drinking ? ?Eat a healthy, balanced diet. This diet includes: ?Daily servings of a variety of fresh, frozen, or canned fruits and vegetables. ?Daily servings of whole grain foods that are rich in fiber. ?Foods that are low in saturated fats and trans fats. These include poultry and fish without skin, lean cuts of meat, and low-fat dairy products. ?A variety of fish, especially oily fish that contain omega-3 fatty acids. Aim to eat fish at least 2 times a week. ?Avoid foods and drinks that have added sugar. ?Use healthy cooking methods, such as roasting, grilling, broiling, baking, poaching, steaming, and stir-frying. Do not fry your food except for  stir-frying. ?If you drink alcohol: ?Limit how much you have to: ?0-1 drink a day for women who are not pregnant. ?0-2 drinks a day for men. ?Know how much alcohol is in a drink. In the [place] one drink equals one 12 oz bottle of beer (355 mL),  one 5 oz glass of wine (148 mL), or one 1? oz glass of hard liquor (44 mL). ?Lifestyle ? ?Get regular exercise. Aim to exercise for a total of 150 minutes a week. Increase your activity level by doing activities such as gardening, walking, and taking the stairs. ?Do not use any products that contain nicotine or tobacco. These products include cigarettes, chewing tobacco, and vaping devices, such as e-cigarettes. If you need help quitting, ask your health care provider. ?General instructions ?Take over-the-counter and prescription medicines only as told by your health care provider. ?Keep all follow-up visits. This is important. ?Where to find more information ?American Heart Association: www.heart.org ?National Heart, Lung, and Blood Institute: PopSteam.is ?Contact a health care provider if: ?You have trouble achieving or maintaining a healthy diet or weight. ?You are starting an exercise program. ?You are unable to stop smoking. ?Get help right away if: ?You have chest pain. ?You have trouble breathing. ?You have discomfort or pain in your jaw, neck, back, shoulder, or arm. ?You have any symptoms of a stroke. "BE FAST" is an easy way to remember the main warning signs of a stroke: ?B - Balance. Signs are dizziness, sudden trouble walking, or loss of balance. ?E - Eyes. Signs are trouble seeing or a sudden change in vision. ?F - Face. Signs are sudden weakness or numbness of the face, or the face or eyelid drooping on one side. ?A - Arms. Signs are weakness or numbness in an arm. This happens suddenly and usually on one side of the body. ?S - Speech. Signs are sudden trouble speaking, slurred speech, or trouble understanding what people say. ?T - Time. Time to call emergency services. Write down what time symptoms started. ?You have other signs of a stroke, such as: ?A sudden, severe headache with no known cause. ?Nausea or vomiting. ?Seizure. ?These symptoms may represent a serious problem that is an  emergency. Do not wait to see if the symptoms will go away. Get medical help right away. Call your local emergency services (911 in the [place] Do not drive yourself to the hospital. ?Summary ?Cholesterol plaques increase your risk for heart attack and stroke. Work with your health care provider to keep your cholesterol levels in a healthy range. ?Eat a healthy, balanced diet, get regular exercise, and maintain a healthy weight. ?Do not use any products that contain nicotine or tobacco. These products include cigarettes, chewing tobacco, and vaping devices, such as e-cigarettes. ?Get help right away if you have any symptoms of a stroke. ?This information is not intended to replace advice given to you by your health care provider. Make sure you discuss any questions you have with your health care provider. ?Document Revised: 12/07/2020 Document Reviewed: 11/27/2020 ?Elsevier Patient Education ? 2023 Elsevier Inc. ?Hypertension, Adult ?Hypertension is another name for high blood pressure. High blood pressure forces your heart to work harder to pump blood. This can cause problems over time. ?There are two numbers in a blood pressure reading. There is a top number (systolic) over a bottom number (diastolic). It is best to have a blood pressure that is below 120/80. ?What are the causes? ?The cause of this condition is not known. Some other conditions  can lead to high blood pressure. ?What increases the risk? ?Some lifestyle factors can make you more likely to develop high blood pressure: ?Smoking. ?Not getting enough exercise or physical activity. ?Being overweight. ?Having too much fat, sugar, calories, or salt (sodium) in your diet. ?Drinking too much alcohol. ?Other risk factors include: ?Having any of these conditions: ?Heart disease. ?Diabetes. ?High cholesterol. ?Kidney disease. ?Obstructive sleep apnea. ?Having a family history of high blood pressure and high cholesterol. ?Age. The risk increases with  age. ?Stress. ?What are the signs or symptoms? ?High blood pressure may not cause symptoms. Very high blood pressure (hypertensive crisis) may cause: ?Headache. ?Fast or uneven heartbeats (palpitations). ?Shortness of breath. ?Nos

## 2022-02-04 NOTE — Assessment & Plan Note (Signed)
Completed labs CBC, CMP lipid panel.  A1c-results pending.  Education provided to patient to continue diabetic diet, exercise and weight.  ?Follow-up 2 weeks. ?

## 2022-02-04 NOTE — Assessment & Plan Note (Signed)
Labs completed results pending.  Continue low-cholesterol diet, exercise and weight loss. 

## 2022-02-05 LAB — CBC WITH DIFFERENTIAL/PLATELET
Basophils Absolute: 0 10*3/uL (ref 0.0–0.2)
Basos: 1 %
EOS (ABSOLUTE): 0.1 10*3/uL (ref 0.0–0.4)
Eos: 3 %
Hematocrit: 47.3 % (ref 37.5–51.0)
Hemoglobin: 16.4 g/dL (ref 13.0–17.7)
Immature Grans (Abs): 0 10*3/uL (ref 0.0–0.1)
Immature Granulocytes: 0 %
Lymphocytes Absolute: 2.1 10*3/uL (ref 0.7–3.1)
Lymphs: 41 %
MCH: 30.2 pg (ref 26.6–33.0)
MCHC: 34.7 g/dL (ref 31.5–35.7)
MCV: 87 fL (ref 79–97)
Monocytes Absolute: 0.3 10*3/uL (ref 0.1–0.9)
Monocytes: 6 %
Neutrophils Absolute: 2.5 10*3/uL (ref 1.4–7.0)
Neutrophils: 49 %
Platelets: 140 10*3/uL — ABNORMAL LOW (ref 150–450)
RBC: 5.43 x10E6/uL (ref 4.14–5.80)
RDW: 13.1 % (ref 11.6–15.4)
WBC: 5.1 10*3/uL (ref 3.4–10.8)

## 2022-02-05 LAB — CMP14+EGFR
ALT: 35 IU/L (ref 0–44)
AST: 36 IU/L (ref 0–40)
Albumin/Globulin Ratio: 1.3 (ref 1.2–2.2)
Albumin: 4.1 g/dL (ref 4.0–5.0)
Alkaline Phosphatase: 115 IU/L (ref 44–121)
BUN/Creatinine Ratio: 16 (ref 9–20)
BUN: 10 mg/dL (ref 6–24)
Bilirubin Total: 1.6 mg/dL — ABNORMAL HIGH (ref 0.0–1.2)
CO2: 25 mmol/L (ref 20–29)
Calcium: 9.2 mg/dL (ref 8.7–10.2)
Chloride: 101 mmol/L (ref 96–106)
Creatinine, Ser: 0.63 mg/dL — ABNORMAL LOW (ref 0.76–1.27)
Globulin, Total: 3.2 g/dL (ref 1.5–4.5)
Glucose: 253 mg/dL — ABNORMAL HIGH (ref 70–99)
Potassium: 4.5 mmol/L (ref 3.5–5.2)
Sodium: 138 mmol/L (ref 134–144)
Total Protein: 7.3 g/dL (ref 6.0–8.5)
eGFR: 123 mL/min/{1.73_m2} (ref >59)

## 2022-02-05 LAB — LIPID PANEL
Chol/HDL Ratio: 4.9 ratio (ref 0.0–5.0)
Cholesterol, Total: 163 mg/dL (ref 100–199)
HDL: 33 mg/dL — ABNORMAL LOW (ref >39)
LDL Chol Calc (NIH): 103 mg/dL — ABNORMAL HIGH (ref 0–99)
Triglycerides: 153 mg/dL — ABNORMAL HIGH (ref 0–149)
VLDL Cholesterol Cal: 27 mg/dL (ref 5–40)

## 2022-02-05 LAB — THYROID PANEL WITH TSH
Free Thyroxine Index: 2.5 (ref 1.2–4.9)
T3 Uptake Ratio: 29 % (ref 24–39)
T4, Total: 8.7 ug/dL (ref 4.5–12.0)
TSH: 2.53 u[IU]/mL (ref 0.450–4.500)

## 2022-02-05 LAB — MICROALBUMIN / CREATININE URINE RATIO
Creatinine, Urine: 191.4 mg/dL
Microalb/Creat Ratio: 25 mg/g creat (ref 0–29)
Microalbumin, Urine: 48.6 ug/mL

## 2022-02-05 LAB — BAYER DCA HB A1C WAIVED: HB A1C (BAYER DCA - WAIVED): 9.3 % — ABNORMAL HIGH (ref 4.8–5.6)

## 2022-02-14 ENCOUNTER — Ambulatory Visit (INDEPENDENT_AMBULATORY_CARE_PROVIDER_SITE_OTHER): Payer: BC Managed Care – PPO | Admitting: Pharmacist

## 2022-02-14 DIAGNOSIS — E119 Type 2 diabetes mellitus without complications: Secondary | ICD-10-CM | POA: Diagnosis not present

## 2022-02-14 MED ORDER — OZEMPIC (0.25 OR 0.5 MG/DOSE) 2 MG/1.5ML ~~LOC~~ SOPN: PEN_INJECTOR | SUBCUTANEOUS | 5 refills | Status: DC

## 2022-02-14 NOTE — Progress Notes (Signed)
? ? ?  02/14/2022 ?Name: ODEN LINDAMAN MRN: [id number] DOB: 1981/09/14 ? ? ?S:  84 yoM Presents for diabetes evaluation, education, and management.  Patient was referred and last seen by Primary Care Provider on 02/04/22. ? ?Insurance coverage/medication affordability: bcbs ? ?Patient reports adherence with medications. ?Current diabetes medications include: metformin/glyburide ?Current hypertension medications include: N/A ?Goal 130/80 ?Current hyperlipidemia medications include: ROSUVASTATIN ?  ?Patient denies hypoglycemic events. ?  ?Patient reported dietary habits: Eats 3 meals/day ?Patient reports eating better ?Drinking more water ?Avoiding sodas ? ? ?Patient-reported exercise habits:  recently started jogging ? ? ?O: ? ?Lab Results  ?Component Value Date  ? HGBA1C 9.3 (H) 02/04/2022  ? ? ? ?Lipid Panel ? ?   ?Component Value Date/Time  ? CHOL 163 02/04/2022 1624  ? TRIG 153 (H) 02/04/2022 1624  ? HDL 33 (L) 02/04/2022 1624  ? CHOLHDL 4.9 02/04/2022 1624  ? LDLCALC 103 (H) 02/04/2022 1624  ? ? ? Home fasting blood sugars: improving <180 ? ?2 hour post-meal/random blood sugars: n/a. ?  ? ?Clinical Atherosclerotic Cardiovascular Disease (ASCVD): No  ? ?The 10-year ASCVD risk score (Arnett DK, et al., 2019) is: 2.5% ?  Values used to calculate the score: ?    Age: 41 years ?    Sex: Male ?    Is Non-Hispanic African American: No ?    Diabetic: Yes ?    Tobacco smoker: No ?    Systolic Blood Pressure: 118 mmHg ?    Is BP treated: No ?    HDL Cholesterol: 33 mg/dL ?    Total Cholesterol: 163 mg/dL ?  ? ?A/P: ? ?Diabetes T2DM currently uncontrolled--A1c of 9.3 ? ?-New start ozempic pending insurance ? Start with 0.25mg  sq weekly then increase to 0.5mg  weekly after 4 weeks ? Denies personal and family history of Medullary thyroid cancer (MTC) ? ?-Continue metformin/glyburide (may be able to d/c) ? ?-Extensively discussed pathophysiology of diabetes, recommended lifestyle interventions, dietary effects on blood sugar  control ? ?-Counseled on s/sx of and management of hypoglycemia ? ?-Next A1C anticipated 3 months. ? ?Written patient instructions provided.  Total time in counseling 20 minutes.  ? ?Follow up PCP Clinic Visit in 3 months.  ? ? ?Kieth Brightly, PharmD, BCPS ?Clinical Pharmacist, [facility] ?[place]  [place] Phone [phone] ? ? ?

## 2022-04-02 ENCOUNTER — Ambulatory Visit: Payer: BC Managed Care – PPO | Admitting: Family Medicine

## 2022-04-02 DIAGNOSIS — Z6841 Body Mass Index (BMI) 40.0 and over, adult: Secondary | ICD-10-CM | POA: Diagnosis not present

## 2022-04-02 DIAGNOSIS — J039 Acute tonsillitis, unspecified: Secondary | ICD-10-CM | POA: Diagnosis not present

## 2022-04-10 ENCOUNTER — Telehealth: Payer: Self-pay

## 2022-04-10 NOTE — Telephone Encounter (Signed)
Key: [id number]  Need help? Call us at [phone]  Outcome  Additional Information Required  Available without authorization. Drug glyBURIDE-metFORMIN 5-500MG  tablets Form Photographer PA Form [phone] NCPDP)

## 2022-05-07 ENCOUNTER — Ambulatory Visit (INDEPENDENT_AMBULATORY_CARE_PROVIDER_SITE_OTHER): Payer: BC Managed Care – PPO | Admitting: Nurse Practitioner

## 2022-05-07 ENCOUNTER — Encounter: Payer: Self-pay | Admitting: Nurse Practitioner

## 2022-05-07 VITALS — BP 134/90 | HR 111 | Temp 98.6°F | Ht 74.0 in | Wt 308.0 lb

## 2022-05-07 DIAGNOSIS — R6889 Other general symptoms and signs: Secondary | ICD-10-CM | POA: Diagnosis not present

## 2022-05-07 DIAGNOSIS — Z0001 Encounter for general adult medical examination with abnormal findings: Secondary | ICD-10-CM | POA: Diagnosis not present

## 2022-05-07 DIAGNOSIS — Z0000 Encounter for general adult medical examination without abnormal findings: Secondary | ICD-10-CM | POA: Insufficient documentation

## 2022-05-07 DIAGNOSIS — E119 Type 2 diabetes mellitus without complications: Secondary | ICD-10-CM

## 2022-05-07 LAB — BAYER DCA HB A1C WAIVED: HB A1C (BAYER DCA - WAIVED): 9 % — ABNORMAL HIGH (ref 4.8–5.6)

## 2022-05-07 MED ORDER — GLYBURIDE-METFORMIN 5-500 MG PO TABS: 1.0000 | ORAL_TABLET | Freq: Every day | ORAL | 1 refills | Status: DC

## 2022-05-07 NOTE — Assessment & Plan Note (Signed)
Completed labs CBC, CMP, lipid panel.  A1c completed patient is improving from 9.3-9.  Continue current medication no changes necessary.  Follow-up in 3 months.

## 2022-05-07 NOTE — Patient Instructions (Signed)
Health Maintenance, Male Adopting a healthy lifestyle and getting preventive care are important in promoting health and wellness. Ask your health care provider about: The right schedule for you to have regular tests and exams. Things you can do on your own to prevent diseases and keep yourself healthy. What should I know about diet, weight, and exercise? Eat a healthy diet  Eat a diet that includes plenty of vegetables, fruits, low-fat dairy products, and lean protein. Do not eat a lot of foods that are high in solid fats, added sugars, or sodium. Maintain a healthy weight Body mass index (BMI) is a measurement that can be used to identify possible weight problems. It estimates body fat based on height and weight. Your health care provider can help determine your BMI and help you achieve or maintain a healthy weight. Get regular exercise Get regular exercise. This is one of the most important things you can do for your health. Most adults should: Exercise for at least 150 minutes each week. The exercise should increase your heart rate and make you sweat (moderate-intensity exercise). Do strengthening exercises at least twice a week. This is in addition to the moderate-intensity exercise. Spend less time sitting. Even light physical activity can be beneficial. Watch cholesterol and blood lipids Have your blood tested for lipids and cholesterol at 42 years of age, then have this test every 5 years. You may need to have your cholesterol levels checked more often if: Your lipid or cholesterol levels are high. You are older than 42 years of age. You are at high risk for heart disease. What should I know about cancer screening? Many types of cancers can be detected early and may often be prevented. Depending on your health history and family history, you may need to have cancer screening at various ages. This may include screening for: Colorectal cancer. Prostate cancer. Skin cancer. Lung  cancer. What should I know about heart disease, diabetes, and high blood pressure? Blood pressure and heart disease High blood pressure causes heart disease and increases the risk of stroke. This is more likely to develop in people who have high blood pressure readings or are overweight. Talk with your health care provider about your target blood pressure readings. Have your blood pressure checked: Every 3-5 years if you are 18-39 years of age. Every year if you are 40 years old or older. If you are between the ages of 65 and 75 and are a current or former smoker, ask your health care provider if you should have a one-time screening for abdominal aortic aneurysm (AAA). Diabetes Have regular diabetes screenings. This checks your fasting blood sugar level. Have the screening done: Once every three years after age 45 if you are at a normal weight and have a low risk for diabetes. More often and at a younger age if you are overweight or have a high risk for diabetes. What should I know about preventing infection? Hepatitis B If you have a higher risk for hepatitis B, you should be screened for this virus. Talk with your health care provider to find out if you are at risk for hepatitis B infection. Hepatitis C Blood testing is recommended for: Everyone born from 1945 through 1965. Anyone with known risk factors for hepatitis C. Sexually transmitted infections (STIs) You should be screened each year for STIs, including gonorrhea and chlamydia, if: You are sexually active and are younger than 42 years of age. You are older than 42 years of age and your   health care provider tells you that you are at risk for this type of infection. Your sexual activity has changed since you were last screened, and you are at increased risk for chlamydia or gonorrhea. Ask your health care provider if you are at risk. Ask your health care provider about whether you are at high risk for HIV. Your health care provider  may recommend a prescription medicine to help prevent HIV infection. If you choose to take medicine to prevent HIV, you should first get tested for HIV. You should then be tested every 3 months for as long as you are taking the medicine. Follow these instructions at home: Alcohol use Do not drink alcohol if your health care provider tells you not to drink. If you drink alcohol: Limit how much you have to 0-2 drinks a day. Know how much alcohol is in your drink. In the [place] one drink equals one 12 oz bottle of beer (355 mL), one 5 oz glass of wine (148 mL), or one 1 oz glass of hard liquor (44 mL). Lifestyle Do not use any products that contain nicotine or tobacco. These products include cigarettes, chewing tobacco, and vaping devices, such as e-cigarettes. If you need help quitting, ask your health care provider. Do not use street drugs. Do not share needles. Ask your health care provider for help if you need support or information about quitting drugs. General instructions Schedule regular health, dental, and eye exams. Stay current with your vaccines. Tell your health care provider if: You often feel depressed. You have ever been abused or do not feel safe at home. Summary Adopting a healthy lifestyle and getting preventive care are important in promoting health and wellness. Follow your health care provider's instructions about healthy diet, exercising, and getting tested or screened for diseases. Follow your health care provider's instructions on monitoring your cholesterol and blood pressure. This information is not intended to replace advice given to you by your health care provider. Make sure you discuss any questions you have with your health care provider. Document Revised: 02/12/2021 Document Reviewed: 02/12/2021 Elsevier Patient Education  2023 Elsevier Inc.  

## 2022-05-07 NOTE — Progress Notes (Signed)
Established Patient Office Visit  Subjective   Patient ID: Jack Porter, male    DOB: 05-15-1981  Age: 41 y.o. MRN: [id number]  Chief Complaint  Patient presents with   Annual Exam    HPI    Encounter for general adult medical examination without abnormal findings  Physical: Patient's last physical exam was 1 year ago .  Weight: Appropriate for height (BMI greater than 27) Blood Pressure: Normal (BP greater than 120/80  Medical History: Patient history reviewed ; Family history reviewed ;  Allergies Reviewed: No change in current allergies ;  Medications Reviewed: Medications reviewed - no changes ;  Lipids: Normal lipid levels ; labs completed results pending Smoking: Life-long non-smoker ;  Physical Activity: Exercises at least 3 times per week ; yes Alcohol/Drug Use: Is a non-drinker ; No illicit drug use ;  Patient is not afflicted from Stress Incontinence and Urge Incontinence  Safety: reviewed ; Patient wears a seat belt, has smoke detectors, has carbon monoxide detectors, practices appropriate gun safety, and wears sunscreen with extended sun exposure. Dental Care: biannual cleanings, brushes and flosses daily. Ophthalmology/Optometry: Annual visit.  Hearing loss: none Vision impairments:wears prescription glasses  Patient Active Problem List   Diagnosis Date Noted   Annual physical exam 05/07/2022   Right foot pain 02/20/2021   Mixed hyperlipidemia 12/28/2020   Elevated liver enzymes 12/14/2018   Type 2 diabetes mellitus without complication, without long-term current use of insulin ([place]) 11/02/2018   Obesity (BMI 30-39.9) 11/02/2018   Plantar fasciitis of left foot 11/02/2018   Type 2 diabetes mellitus with hypertriglyceridemia ([place]) 01/20/2014   Past Medical History:  Diagnosis Date   Diabetes mellitus without complication (HCC)    GERD (gastroesophageal reflux disease)    H/O hiatal hernia    Past Surgical History:  Procedure Laterality Date    CHOLECYSTECTOMY  01/21/2014   CHOLECYSTECTOMY N/A 01/21/2014   Procedure: LAPAROSCOPIC CHOLECYSTECTOMY;  Surgeon: Gayland Curry, MD;  Location: [facility];  Service: General;  Laterality: N/A;   Social History   Tobacco Use   Smoking status: Former    Packs/day: 0.25    Years: 3.00    Total pack years: 0.75    Types: Cigarettes   Smokeless tobacco: Never  Vaping Use   Vaping Use: Never used  Substance Use Topics   Alcohol use: No    Comment: rarely   Drug use: No   Social History   Socioeconomic History   Marital status: Married    Spouse name: Not on file   Number of children: Not on file   Years of education: Not on file   Highest education level: Not on file  Occupational History   Not on file  Tobacco Use   Smoking status: Former    Packs/day: 0.25    Years: 3.00    Total pack years: 0.75    Types: Cigarettes   Smokeless tobacco: Never  Vaping Use   Vaping Use: Never used  Substance and Sexual Activity   Alcohol use: No    Comment: rarely   Drug use: No   Sexual activity: Yes  Other Topics Concern   Not on file  Social History Narrative   Not on file   Social Determinants of Health   Financial Resource Strain: Not on file  Food Insecurity: Not on file  Transportation Needs: Not on file  Physical Activity: Not on file  Stress: Not on file  Social Connections: Not on file  Intimate Partner  Violence: Not on file   Family Status  Relation Name Status   Mother  Alive   Father  Alive   Sister  Alive   Brother  Alive   Sister  Alive   Sister  Alive   Brother  Alive   Family History  Problem Relation Age of Onset   Diabetes Mother    Heart disease Sister    Diabetes Sister    No Known Allergies    Review of Systems  Constitutional: Negative.   HENT: Negative.    Respiratory: Negative.    Cardiovascular: Negative.   Genitourinary: Negative.   Musculoskeletal: Negative.   Skin: Negative.  Negative for itching and rash.  All other systems  reviewed and are negative.     Objective:     BP (!) 134/90   Pulse (!) 111   Temp 98.6 F (37 C)   Ht 6' 2"  (1.88 m)   Wt (!) 308 lb (139.7 kg)   SpO2 96%   BMI 39.54 kg/m  BP Readings from Last 3 Encounters:  05/07/22 (!) 134/90  02/04/22 118/81  10/24/21 124/78   Wt Readings from Last 3 Encounters:  05/07/22 (!) 308 lb (139.7 kg)  02/04/22 (!) 314 lb (142.4 kg)  10/24/21 (!) 314 lb 4 oz (142.5 kg)      Physical Exam Vitals reviewed.  Constitutional:      Appearance: Normal appearance.  HENT:     Right Ear: External ear normal.     Left Ear: External ear normal.     Mouth/Throat:     Mouth: Mucous membranes are moist.     Pharynx: Oropharynx is clear.  Eyes:     Conjunctiva/sclera: Conjunctivae normal.  Cardiovascular:     Rate and Rhythm: Normal rate.     Pulses: Normal pulses.     Heart sounds: Normal heart sounds.  Abdominal:     General: Bowel sounds are normal.  Musculoskeletal:        General: Normal range of motion.  Skin:    General: Skin is warm.     Findings: No erythema or rash.  Neurological:     General: No focal deficit present.     Mental Status: He is alert and oriented to person, place, and time.  Psychiatric:        Mood and Affect: Mood normal.      Results for orders placed or performed in visit on 05/07/22  Bayer DCA Hb A1c Waived  Result Value Ref Range   HB A1C (BAYER DCA - WAIVED) 9.0 (H) 4.8 - 5.6 %    Last CBC Lab Results  Component Value Date   WBC 5.1 02/04/2022   HGB 16.4 02/04/2022   HCT 47.3 02/04/2022   MCV 87 02/04/2022   MCH 30.2 02/04/2022   RDW 13.1 02/04/2022   PLT 140 (L) 28/31/5176   Last metabolic panel Lab Results  Component Value Date   GLUCOSE 253 (H) 02/04/2022   NA 138 02/04/2022   K 4.5 02/04/2022   CL 101 02/04/2022   CO2 25 02/04/2022   BUN 10 02/04/2022   CREATININE 0.63 (L) 02/04/2022   EGFR 123 02/04/2022   CALCIUM 9.2 02/04/2022   PROT 7.3 02/04/2022   ALBUMIN 4.1 02/04/2022    LABGLOB 3.2 02/04/2022   AGRATIO 1.3 02/04/2022   BILITOT 1.6 (H) 02/04/2022   ALKPHOS 115 02/04/2022   AST 36 02/04/2022   ALT 35 02/04/2022   Last lipids Lab Results  Component  Value Date   CHOL 163 02/04/2022   HDL 33 (L) 02/04/2022   LDLCALC 103 (H) 02/04/2022   TRIG 153 (H) 02/04/2022   CHOLHDL 4.9 02/04/2022   Last hemoglobin A1c Lab Results  Component Value Date   HGBA1C 9.0 (H) 05/07/2022   Last thyroid functions Lab Results  Component Value Date   TSH 2.530 02/04/2022   T4TOTAL 8.7 02/04/2022   Last vitamin D No results found for: "25OHVITD2", "25OHVITD3", "VD25OH" Last vitamin B12 and Folate No results found for: "VITAMINB12", "FOLATE"    The 10-year ASCVD risk score (Arnett DK, et al., 2019) is: 3.6%    Assessment & Plan:   Problem List Items Addressed This Visit       Endocrine   Type 2 diabetes mellitus without complication, without long-term current use of insulin ([place]) - Primary    Completed labs CBC, CMP, lipid panel.  A1c completed patient is improving from 9.3-9.  Continue current medication no changes necessary.  Follow-up in 3 months.      Relevant Medications   glyBURIDE-metformin (GLUCOVANCE) 5-500 MG tablet   Other Relevant Orders   Lipid panel   Comprehensive metabolic panel   CBC with Differential/Platelet   Thyroid Panel With TSH   Bayer DCA Hb A1c Waived (Completed)   Hepatitis C Antibody   HIV Antibody (routine testing w rflx)     Other   Annual physical exam    Completed physical exam.  Education provided for health maintenance and preventative care.  Printed handouts given.  Follow-up in 1 year       Return in about 1 year (around 05/08/2023) for  and 3 months for chronic disease management.    Ivy Lynn, NP

## 2022-05-07 NOTE — Assessment & Plan Note (Signed)
Completed physical exam.  Education provided for health maintenance and preventative care.  Printed handouts given.  Follow-up in 1 year

## 2022-05-08 LAB — COMPREHENSIVE METABOLIC PANEL
ALT: 35 IU/L (ref 0–44)
AST: 44 IU/L — ABNORMAL HIGH (ref 0–40)
Albumin/Globulin Ratio: 1.3 (ref 1.2–2.2)
Albumin: 4.1 g/dL (ref 4.1–5.1)
Alkaline Phosphatase: 110 IU/L (ref 44–121)
BUN/Creatinine Ratio: 18 (ref 9–20)
BUN: 12 mg/dL (ref 6–24)
Bilirubin Total: 1.9 mg/dL — ABNORMAL HIGH (ref 0.0–1.2)
CO2: 25 mmol/L (ref 20–29)
Calcium: 9.7 mg/dL (ref 8.7–10.2)
Chloride: 101 mmol/L (ref 96–106)
Creatinine, Ser: 0.66 mg/dL — ABNORMAL LOW (ref 0.76–1.27)
Globulin, Total: 3.2 g/dL (ref 1.5–4.5)
Glucose: 298 mg/dL — ABNORMAL HIGH (ref 70–99)
Potassium: 4.7 mmol/L (ref 3.5–5.2)
Sodium: 141 mmol/L (ref 134–144)
Total Protein: 7.3 g/dL (ref 6.0–8.5)
eGFR: 120 mL/min/{1.73_m2} (ref >59)

## 2022-05-08 LAB — CBC WITH DIFFERENTIAL/PLATELET
Basophils Absolute: 0 10*3/uL (ref 0.0–0.2)
Basos: 1 %
EOS (ABSOLUTE): 0.1 10*3/uL (ref 0.0–0.4)
Eos: 2 %
Hematocrit: 49 % (ref 37.5–51.0)
Hemoglobin: 16.4 g/dL (ref 13.0–17.7)
Immature Grans (Abs): 0 10*3/uL (ref 0.0–0.1)
Immature Granulocytes: 0 %
Lymphocytes Absolute: 2.2 10*3/uL (ref 0.7–3.1)
Lymphs: 41 %
MCH: 29.7 pg (ref 26.6–33.0)
MCHC: 33.5 g/dL (ref 31.5–35.7)
MCV: 89 fL (ref 79–97)
Monocytes Absolute: 0.3 10*3/uL (ref 0.1–0.9)
Monocytes: 6 %
Neutrophils Absolute: 2.7 10*3/uL (ref 1.4–7.0)
Neutrophils: 50 %
Platelets: 142 10*3/uL — ABNORMAL LOW (ref 150–450)
RBC: 5.53 x10E6/uL (ref 4.14–5.80)
RDW: 13.5 % (ref 11.6–15.4)
WBC: 5.2 10*3/uL (ref 3.4–10.8)

## 2022-05-08 LAB — LIPID PANEL
Chol/HDL Ratio: 4.7 ratio (ref 0.0–5.0)
Cholesterol, Total: 146 mg/dL (ref 100–199)
HDL: 31 mg/dL — ABNORMAL LOW (ref >39)
LDL Chol Calc (NIH): 85 mg/dL (ref 0–99)
Triglycerides: 170 mg/dL — ABNORMAL HIGH (ref 0–149)
VLDL Cholesterol Cal: 30 mg/dL (ref 5–40)

## 2022-05-08 LAB — THYROID PANEL WITH TSH
Free Thyroxine Index: 1.9 (ref 1.2–4.9)
T3 Uptake Ratio: 23 % — ABNORMAL LOW (ref 24–39)
T4, Total: 8.3 ug/dL (ref 4.5–12.0)
TSH: 1.97 u[IU]/mL (ref 0.450–4.500)

## 2022-05-08 LAB — HIV ANTIBODY (ROUTINE TESTING W REFLEX): HIV Screen 4th Generation wRfx: NONREACTIVE

## 2022-05-08 LAB — HEPATITIS C ANTIBODY: Hep C Virus Ab: NONREACTIVE

## 2022-06-18 DIAGNOSIS — J039 Acute tonsillitis, unspecified: Secondary | ICD-10-CM | POA: Diagnosis not present

## 2022-06-18 DIAGNOSIS — Z6841 Body Mass Index (BMI) 40.0 and over, adult: Secondary | ICD-10-CM | POA: Diagnosis not present

## 2022-07-09 ENCOUNTER — Encounter: Payer: Self-pay | Admitting: *Deleted

## 2022-08-09 ENCOUNTER — Ambulatory Visit: Payer: BC Managed Care – PPO | Admitting: Nurse Practitioner

## 2022-08-14 ENCOUNTER — Ambulatory Visit: Payer: BC Managed Care – PPO | Admitting: Nurse Practitioner

## 2022-09-09 ENCOUNTER — Encounter: Payer: Self-pay | Admitting: Nurse Practitioner

## 2022-09-09 ENCOUNTER — Ambulatory Visit: Payer: BC Managed Care – PPO | Admitting: Nurse Practitioner

## 2022-09-09 VITALS — BP 138/86 | HR 103 | Temp 98.7°F | Ht 74.0 in | Wt 307.0 lb

## 2022-09-09 DIAGNOSIS — E119 Type 2 diabetes mellitus without complications: Secondary | ICD-10-CM | POA: Diagnosis not present

## 2022-09-09 DIAGNOSIS — E782 Mixed hyperlipidemia: Secondary | ICD-10-CM

## 2022-09-09 DIAGNOSIS — R110 Nausea: Secondary | ICD-10-CM

## 2022-09-09 MED ORDER — SEMAGLUTIDE (1 MG/DOSE) 4 MG/3ML ~~LOC~~ SOPN: 1.0000 mg | PEN_INJECTOR | SUBCUTANEOUS | 3 refills | Status: DC

## 2022-09-09 MED ORDER — ROSUVASTATIN CALCIUM 5 MG PO TABS: 5.0000 mg | ORAL_TABLET | Freq: Every day | ORAL | 1 refills | Status: DC

## 2022-09-09 MED ORDER — ONDANSETRON HCL 4 MG PO TABS: 4.0000 mg | ORAL_TABLET | Freq: Three times a day (TID) | ORAL | 0 refills | Status: DC | PRN

## 2022-09-09 NOTE — Patient Instructions (Signed)
Diabetes Insipidus Diabetes insipidus (DI) is a rare condition that causes the body to produce more urine than normal. This leads to thirst and low fluid in the body (dehydration). In this condition, the urine is made mostly of water, or dilute urine. DI affects mostly adults, but it can happen at any age. There are four types of DI: Central DI. This is the most common type. Dipsogenic DI. Nephrogenic DI. Gestational DI. The most common forms of this condition are caused by a decrease in the production of the hormone that regulates urine output (antidiuretic hormone), or the body's resistance to this hormone. This condition is not related to type 1 or type 2 diabetes mellitus. What are the causes? Central DI is caused by damage to the pituitary gland or hypothalamus in the brain. Dipsogenic DI is caused by a defect in the thirst mechanism in the brain. This defect causes you to drink too much fluid. These may result from: Brain surgery. Infection. Inflammation. Brain tumor. Head injury. Nephrogenic DI is caused by the kidneys not responding to the antidiuretic hormone in the body. This may result from: Chronic kidney disease (CKD). Certain medicines, such as lithium. Low potassium levels. High calcium levels. Gestational DI is rare and is caused by the antidiuretic hormone that has stopped working properly. What are the signs or symptoms? Symptoms of this condition include: Excessive urination. This means urinating more than 10 cups (2.4 L) during a period of 24 hours. Excessive thirst. Too much nighttime urination (nocturia). Nausea. Diarrhea. How is this diagnosed? This condition may be diagnosed based on: Your medical history. A physical exam. Blood tests. Urine tests. A water deprivation test. During this test, you will stop drinking fluids for a period of time and your blood and urine will be checked regularly. An MRI. How is this treated? Once your specific type of  diabetes insipidus is diagnosed, treatment may include one or more of the following: Increasing or limiting your fluid intake. Taking medicines that contain artificial (synthetic) versions of the antidiuretic hormone. Stopping certain medicines that you take. Correcting the balance of minerals (electrolytes) in your body. Changing your diet. You may be put on a low-protein and low-sodium diet. You may need to visit your health care provider regularly to make sure your condition is being treated properly. You may also need to work with providers who specialize in: Kidney problems (nephrologist). Hormone disorders (endocrinologist). Follow these instructions at home:  Eating and drinking Follow instructions from your health care provider about how much fluid and water to drink. You may be directed to drink more fluids and water, or to limit how much fluid and water you drink. Follow instructions from your health care provider about eating or drinking restrictions. General instructions Take over-the-counter and prescription medicines only as told by your health care provider. If directed, monitor your risk of dehydration in extreme heat. Carry a medical alert card or wear medical alert jewelry. Keep all follow-up visits as told by your health care provider. This is important. You may need to visit your health care provider regularly to make sure your condition is being treated properly. Contact a health care provider if: You continue to have symptoms after treatment. Get help right away if: You have extreme thirst. You have symptoms of severe dehydration, such as rapid heart rate, muscle cramps, or confusion. Summary Diabetes insipidus (DI) is a rare condition that causes the body to produce more urine than normal, which leads to thirst and dehydration. Follow instructions   from your health care provider about eating or drinking restrictions. Treatment may include increasing or limiting your  fluid intake and correcting the balance of minerals (electrolytes) in your body. Get help right away if you have symptoms of severe dehydration, such as rapid heart rate, muscle cramps, or confusion. This information is not intended to replace advice given to you by your health care provider. Make sure you discuss any questions you have with your health care provider. Document Revised: 02/26/2022 Document Reviewed: 10/27/2019 Elsevier Patient Education  2023 Elsevier Inc.  

## 2022-09-09 NOTE — Assessment & Plan Note (Signed)
Labs completed results pending-CBC, CMP, lipid panel.  Follow-up in 3 months.  Started patient back on Ozempic 1 mg subcu weekly.  Follow-up in 3 months.  Continue low calorie diet, diabetic diet and exercise.

## 2022-09-09 NOTE — Assessment & Plan Note (Signed)
Labs completed results pending.  

## 2022-09-09 NOTE — Progress Notes (Signed)
Established Patient Office Visit  Subjective   Patient ID: Jack Porter, male    DOB: 1981-09-19  Age: 41 y.o. MRN: [id number]  Chief Complaint  Patient presents with   Medical Management of Chronic Issues    90month   Diabetes He presents for his follow-up diabetic visit. He has type 2 diabetes mellitus. The initial diagnosis of diabetes was made 2 years ago. His disease course has been improving. Pertinent negatives for hypoglycemia include no confusion, dizziness, headaches, mood changes, nervousness/anxiousness or pallor. Pertinent negatives for diabetes include no blurred vision, no chest pain, no fatigue, no foot ulcerations, no polydipsia and no polyphagia. There are no hypoglycemic complications. Symptoms are improving. There are no diabetic complications. Risk factors for coronary artery disease include obesity, male sex, dyslipidemia and diabetes mellitus. Current diabetic treatment includes oral agent (dual therapy) (GLP1). He is compliant with treatment most of the time. His weight is stable. He is following a diabetic and generally healthy diet. When asked about meal planning, he reported none. He has not had a previous visit with a dietitian. He participates in exercise three times a week. His home blood glucose trend is decreasing steadily. An ACE inhibitor/angiotensin II receptor blocker is not being taken. He sees a podiatrist.Eye exam is current.  Hyperlipidemia This is a chronic problem. The current episode started more than 1 year ago. The problem is controlled. Exacerbating diseases include diabetes and obesity. He has no history of chronic renal disease or hypothyroidism. Factors aggravating his hyperlipidemia include fatty foods. Pertinent negatives include no chest pain, focal weakness or myalgias. The current treatment provides significant improvement of lipids. There are no compliance problems.  Risk factors for coronary artery disease include diabetes mellitus, male sex,  obesity and dyslipidemia.    Patient Active Problem List   Diagnosis Date Noted   Annual physical exam 05/07/2022   Right foot pain 02/20/2021   Mixed hyperlipidemia 12/28/2020   Elevated liver enzymes 12/14/2018   Type 2 diabetes mellitus without complication, without long-term current use of insulin ([place] 11/02/2018   Obesity (BMI 30-39.9) 11/02/2018   Plantar fasciitis of left foot 11/02/2018   Type 2 diabetes [place]triglyceridemia [place] 01/20/2014   Past Medical History:  Diagnosis Date   Diabetes mellitus without complication (HCC)    GERD (gastroesophageal reflux disease)    H/O hiatal hernia    Past Surgical History:  Procedure Laterality Date   CHOLECYSTECTOMY  01/21/2014   CHOLECYSTECTOMY N/A 01/21/2014   Procedure: LAPAROSCOPIC CHOLECYSTECTOMY;  Surgeon: EGayland Curry MD;  Location: [facility];  Service: General;  Laterality: N/A;   Social History   Tobacco Use   Smoking status: Former    Packs/day: 0.25    Years: 3.00    Total pack years: 0.75    Types: Cigarettes   Smokeless tobacco: Never  Vaping Use   Vaping Use: Never used  Substance Use Topics   Alcohol use: No    Comment: rarely   Drug use: No   Social History   Socioeconomic History   Marital status: Married    Spouse name: Not on file   Number of children: Not on file   Years of education: Not on file   Highest education level: Not on file  Occupational History   Not on file  Tobacco Use   Smoking status: Former    Packs/day: 0.25    Years: 3.00    Total pack years: 0.75    Types: Cigarettes  Smokeless tobacco: Never  Vaping Use   Vaping Use: Never used  Substance and Sexual Activity   Alcohol use: No    Comment: rarely   Drug use: No   Sexual activity: Yes  Other Topics Concern   Not on file  Social History Narrative   Not on file   Social Determinants of Health   Financial Resource Strain: Not on file  Food Insecurity: Not on file  Transportation Needs: Not on  file  Physical Activity: Not on file  Stress: Not on file  Social Connections: Not on file  Intimate Partner Violence: Not on file   Family Status  Relation Name Status   Mother  Alive   Father  Alive   Sister  Alive   Brother  Alive   Sister  Alive   Sister  Alive   Brother  Alive   Family History  Problem Relation Age of Onset   Diabetes Mother    Heart disease Sister    Diabetes Sister    No Known Allergies    Review of Systems  Constitutional: Negative.  Negative for fatigue.  HENT: Negative.    Eyes: Negative.  Negative for blurred vision.  Respiratory: Negative.    Cardiovascular:  Negative for chest pain.  Gastrointestinal: Negative.   Genitourinary: Negative.   Musculoskeletal: Negative.  Negative for myalgias.  Skin: Negative.  Negative for itching, pallor and rash.  Neurological: Negative.  Negative for dizziness, focal weakness and headaches.  Endo/Heme/Allergies:  Negative for polydipsia and polyphagia.  Psychiatric/Behavioral:  Negative for confusion. The patient is not nervous/anxious.   All other systems reviewed and are negative.     Objective:     BP 138/86   Pulse (!) 103   Temp 98.7 F (37.1 C)   Ht _0  (1.88 m)   Wt (!) 307 lb (139.3 kg)   SpO2 97%   BMI 39.42 kg/m  BP Readings from Last 3 Encounters:  09/09/22 138/86  05/07/22 (!) 134/90  02/04/22 118/81   Wt Readings from Last 3 Encounters:  09/09/22 (!) 307 lb (139.3 kg)  05/07/22 (!) 308 lb (139.7 kg)  02/04/22 (!) 314 lb (142.4 kg)      Physical Exam Vitals and nursing note reviewed.  Constitutional:      Appearance: Normal appearance. He is obese.  HENT:     Head: Normocephalic.     Right Ear: External ear normal.     Left Ear: External ear normal.     Nose: Nose normal.     Mouth/Throat:     Mouth: Mucous membranes are moist.     Pharynx: Oropharynx is clear.  Eyes:     Conjunctiva/sclera: Conjunctivae normal.  Cardiovascular:     Rate and Rhythm: Normal  rate and regular rhythm.     Pulses: Normal pulses.     Heart sounds: Normal heart sounds.  Pulmonary:     Effort: Pulmonary effort is normal.     Breath sounds: Normal breath sounds.  Abdominal:     General: Bowel sounds are normal.  Skin:    General: Skin is warm.     Findings: No erythema or rash.  Neurological:     General: No focal deficit present.     Mental Status: He is alert and oriented to person, place, and time.  Psychiatric:        Mood and Affect: Mood normal.        Behavior: Behavior normal.  No results found for any visits on 09/09/22.  Last CBC Lab Results  Component Value Date   WBC 5.2 05/07/2022   HGB 16.4 05/07/2022   HCT 49.0 05/07/2022   MCV 89 05/07/2022   MCH 29.7 05/07/2022   RDW 13.5 05/07/2022   PLT 142 (L) 03/83/3383   Last metabolic panel Lab Results  Component Value Date   GLUCOSE 298 (H) 05/07/2022   NA 141 05/07/2022   K 4.7 05/07/2022   CL 101 05/07/2022   CO2 25 05/07/2022   BUN 12 05/07/2022   CREATININE 0.66 (L) 05/07/2022   EGFR 120 05/07/2022   CALCIUM 9.7 05/07/2022   PROT 7.3 05/07/2022   ALBUMIN 4.1 05/07/2022   LABGLOB 3.2 05/07/2022   AGRATIO 1.3 05/07/2022   BILITOT 1.9 (H) 05/07/2022   ALKPHOS 110 05/07/2022   AST 44 (H) 05/07/2022   ALT 35 05/07/2022   Last lipids Lab Results  Component Value Date   CHOL 146 05/07/2022   HDL 31 (L) 05/07/2022   LDLCALC 85 05/07/2022   TRIG 170 (H) 05/07/2022   CHOLHDL 4.7 05/07/2022   Last hemoglobin A1c Lab Results  Component Value Date   HGBA1C 9.0 (H) 05/07/2022   Last thyroid functions Lab Results  Component Value Date   TSH 1.970 05/07/2022   T4TOTAL 8.3 05/07/2022   Last vitamin D No results found for: "25OHVITD2", "25OHVITD3", "VD25OH" Last vitamin B12 and Folate No results found for: "VITAMINB12", "FOLATE"    The 10-year ASCVD risk score (Arnett DK, et al., 2019) is: 3.3%    Assessment & Plan:   Problem List Items Addressed This Visit        Endocrine   Type 2 diabetes mellitus without complication, without long-term current use of insulin ([place]) - Primary    Labs completed results pending-CBC, CMP, lipid panel.  Follow-up in 3 months.  Started patient back on Ozempic 1 mg subcu weekly.  Follow-up in 3 months.  Continue low calorie diet, diabetic diet and exercise.      Relevant Medications   rosuvastatin (CRESTOR) 5 MG tablet   Semaglutide, 1 MG/DOSE, 4 MG/3ML SOPN   Other Relevant Orders   CBC with Differential   CMP14+EGFR   Lipid Panel     Other   Mixed hyperlipidemia    Labs completed results pending.      Relevant Medications   rosuvastatin (CRESTOR) 5 MG tablet   Other Relevant Orders   Lipid Panel   Other Visit Diagnoses     Nausea       Relevant Medications   ondansetron (ZOFRAN) 4 MG tablet       Return in about 3 months (around 12/09/2022) for chronic disease management.    Ivy Lynn, NP

## 2022-09-10 ENCOUNTER — Other Ambulatory Visit: Payer: BC Managed Care – PPO

## 2022-09-10 DIAGNOSIS — E782 Mixed hyperlipidemia: Secondary | ICD-10-CM | POA: Diagnosis not present

## 2022-09-10 DIAGNOSIS — E119 Type 2 diabetes mellitus without complications: Secondary | ICD-10-CM | POA: Diagnosis not present

## 2022-09-11 LAB — CMP14+EGFR
ALT: 37 IU/L (ref 0–44)
AST: 38 IU/L (ref 0–40)
Albumin/Globulin Ratio: 1.4 (ref 1.2–2.2)
Albumin: 4.3 g/dL (ref 4.1–5.1)
Alkaline Phosphatase: 131 IU/L — ABNORMAL HIGH (ref 44–121)
BUN/Creatinine Ratio: 15 (ref 9–20)
BUN: 10 mg/dL (ref 6–24)
Bilirubin Total: 2.1 mg/dL — ABNORMAL HIGH (ref 0.0–1.2)
CO2: 22 mmol/L (ref 20–29)
Calcium: 9.1 mg/dL (ref 8.7–10.2)
Chloride: 99 mmol/L (ref 96–106)
Creatinine, Ser: 0.66 mg/dL — ABNORMAL LOW (ref 0.76–1.27)
Globulin, Total: 3.1 g/dL (ref 1.5–4.5)
Glucose: 255 mg/dL — ABNORMAL HIGH (ref 70–99)
Potassium: 4.4 mmol/L (ref 3.5–5.2)
Sodium: 136 mmol/L (ref 134–144)
Total Protein: 7.4 g/dL (ref 6.0–8.5)
eGFR: 120 mL/min/{1.73_m2} (ref >59)

## 2022-09-11 LAB — CBC WITH DIFFERENTIAL/PLATELET
Basophils Absolute: 0 10*3/uL (ref 0.0–0.2)
Basos: 1 %
EOS (ABSOLUTE): 0.1 10*3/uL (ref 0.0–0.4)
Eos: 2 %
Hematocrit: 47.3 % (ref 37.5–51.0)
Hemoglobin: 16.2 g/dL (ref 13.0–17.7)
Immature Grans (Abs): 0 10*3/uL (ref 0.0–0.1)
Immature Granulocytes: 0 %
Lymphocytes Absolute: 1.7 10*3/uL (ref 0.7–3.1)
Lymphs: 39 %
MCH: 29.9 pg (ref 26.6–33.0)
MCHC: 34.2 g/dL (ref 31.5–35.7)
MCV: 87 fL (ref 79–97)
Monocytes Absolute: 0.3 10*3/uL (ref 0.1–0.9)
Monocytes: 6 %
Neutrophils Absolute: 2.4 10*3/uL (ref 1.4–7.0)
Neutrophils: 52 %
Platelets: 131 10*3/uL — ABNORMAL LOW (ref 150–450)
RBC: 5.41 x10E6/uL (ref 4.14–5.80)
RDW: 13.4 % (ref 11.6–15.4)
WBC: 4.5 10*3/uL (ref 3.4–10.8)

## 2022-09-11 LAB — LIPID PANEL
Chol/HDL Ratio: 4.9 ratio (ref 0.0–5.0)
Cholesterol, Total: 171 mg/dL (ref 100–199)
HDL: 35 mg/dL — ABNORMAL LOW (ref >39)
LDL Chol Calc (NIH): 115 mg/dL — ABNORMAL HIGH (ref 0–99)
Triglycerides: 112 mg/dL (ref 0–149)
VLDL Cholesterol Cal: 21 mg/dL (ref 5–40)

## 2022-09-25 DIAGNOSIS — U071 COVID-19: Secondary | ICD-10-CM | POA: Diagnosis not present

## 2022-11-01 ENCOUNTER — Encounter: Payer: Self-pay | Admitting: *Deleted

## 2022-12-05 ENCOUNTER — Encounter: Payer: Self-pay | Admitting: Family Medicine

## 2022-12-05 ENCOUNTER — Ambulatory Visit: Payer: BC Managed Care – PPO | Admitting: Family Medicine

## 2022-12-05 VITALS — BP 128/78 | HR 103 | Ht 74.0 in | Wt 305.0 lb

## 2022-12-05 DIAGNOSIS — M722 Plantar fascial fibromatosis: Secondary | ICD-10-CM

## 2022-12-05 MED ORDER — DICLOFENAC SODIUM 75 MG PO TBEC: 75.0000 mg | DELAYED_RELEASE_TABLET | Freq: Two times a day (BID) | ORAL | 1 refills | Status: DC

## 2022-12-05 NOTE — Patient Instructions (Signed)
In 9 months

## 2022-12-05 NOTE — Progress Notes (Signed)
BP 128/78   Pulse (!) 103   Ht '6\' 2"'$  (1.88 m)   Wt (!) 305 lb (138.3 kg)   SpO2 97%   BMI 39.16 kg/m    Subjective:   Patient ID: Jack Porter, male    DOB: May 02, 1981, 42 y.o.   MRN: [id number]  HPI: Jack Porter is a 42 y.o. male presenting on 12/05/2022 for Foot Pain (Right heel pain)   HPI Right heel pain Patient is coming today with complaint of right heel pain that been bothering her over the past couple weeks.  He says he had plantar fasciitis before and it feels like that again.  He denies any redness or warmth or fevers or chills.  It hurts right on the bottom of his left heel.  He has been using some ibuprofen and a rolling pin to help and he did order some new inserts but did not have them yet.  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  Relevant past medical, surgical, family and social history reviewed and updated as indicated. Interim medical history since our last visit reviewed. Allergies and medications reviewed and updated.  Review of Systems  Constitutional:  Negative for chills and fever.  Eyes:  Negative for visual disturbance.  Respiratory:  Negative for shortness of breath and wheezing.   Cardiovascular:  Negative for chest pain and leg swelling.  Musculoskeletal:  Positive for arthralgias.  Skin:  Negative for rash.  All other systems reviewed and are negative.   Per HPI unless specifically indicated above   Allergies as of 12/05/2022   No Known Allergies      Medication List        Accurate as of December 05, 2022  4:06 PM. If you have any questions, ask your nurse or doctor.          STOP taking these medications    ibuprofen 600 MG tablet Commonly known as: ADVIL Stopped by: Fransisca Kaufmann Remie Mathison, MD   ondansetron 4 MG tablet Commonly known as: Zofran Stopped by: Worthy Rancher, MD       TAKE these medications    blood glucose meter kit and  supplies Kit Dispense based on patient and insurance preference. Use up to four times daily as directed. (FOR ICD-9 250.00, 250.01).   cyclobenzaprine 5 MG tablet Commonly known as: FLEXERIL Take 1 tablet (5 mg total) by mouth 3 (three) times daily as needed for muscle spasms.   diclofenac 75 MG EC tablet Commonly known as: VOLTAREN Take 1 tablet (75 mg total) by mouth 2 (two) times daily. Started by: Worthy Rancher, MD   glyBURIDE-metformin 5-500 MG tablet Commonly known as: GLUCOVANCE Take 1 tablet by mouth daily with breakfast.   IGlucose Test Strips test strip Generic drug: glucose blood Use as instructed   rosuvastatin 5 MG tablet Commonly known as: Crestor Take 1 tablet (5 mg total) by mouth daily.   Semaglutide (1 MG/DOSE) 4 MG/3ML Sopn Inject 1 mg as directed once a week.         Objective:   BP 128/78   Pulse (!) 103   Ht '6\' 2"'$  (1.88 m)   Wt (!) 305 lb (138.3 kg)   SpO2 97%   BMI 39.16 kg/m   Wt Readings from Last 3 Encounters:  12/05/22 (!) 305 lb (138.3 kg)  09/09/22 (!) 307 lb (139.3 kg)  05/07/22 (!) 308 lb (139.7 kg)    Physical Exam Vitals and nursing note reviewed.  Constitutional:      Appearance: Normal appearance.  Musculoskeletal:     Right foot: Tenderness present. No bony tenderness or crepitus.     Left foot: Normal range of motion. No tenderness or crepitus.  Skin:    Findings: No erythema.  Neurological:     Mental Status: He is alert.       Assessment & Plan:   Problem List Items Addressed This Visit   None Visit Diagnoses     Plantar fasciitis of right foot    -  Primary   Relevant Medications   diclofenac (VOLTAREN) 75 MG EC tablet       Will give Voltaren and he is going to do conservative management and take some time off work and if not improved will call us back and may consider injection in the future. Follow up plan: Return if symptoms worsen or fail to improve.  Counseling provided for all of the  vaccine components No orders of the defined types were placed in this encounter.   Caryl Pina, MD [facility] 12/05/2022, 4:06 PM

## 2022-12-09 ENCOUNTER — Ambulatory Visit: Payer: BC Managed Care – PPO | Admitting: Nurse Practitioner

## 2022-12-10 ENCOUNTER — Encounter: Payer: Self-pay | Admitting: Family Medicine

## 2022-12-10 ENCOUNTER — Ambulatory Visit: Payer: BC Managed Care – PPO | Admitting: Family Medicine

## 2022-12-10 VITALS — BP 129/82 | HR 103 | Temp 98.9°F | Ht 74.0 in | Wt 304.0 lb

## 2022-12-10 DIAGNOSIS — Z6839 Body mass index (BMI) 39.0-39.9, adult: Secondary | ICD-10-CM

## 2022-12-10 DIAGNOSIS — E785 Hyperlipidemia, unspecified: Secondary | ICD-10-CM

## 2022-12-10 DIAGNOSIS — E119 Type 2 diabetes mellitus without complications: Secondary | ICD-10-CM

## 2022-12-10 DIAGNOSIS — E1169 Type 2 diabetes mellitus with other specified complication: Secondary | ICD-10-CM | POA: Diagnosis not present

## 2022-12-10 DIAGNOSIS — Z7985 Long-term (current) use of injectable non-insulin antidiabetic drugs: Secondary | ICD-10-CM

## 2022-12-10 DIAGNOSIS — R899 Unspecified abnormal finding in specimens from other organs, systems and tissues: Secondary | ICD-10-CM

## 2022-12-10 LAB — BAYER DCA HB A1C WAIVED: HB A1C (BAYER DCA - WAIVED): 10.2 % — ABNORMAL HIGH (ref 4.8–5.6)

## 2022-12-10 MED ORDER — LOSARTAN POTASSIUM 25 MG PO TABS: 25.0000 mg | ORAL_TABLET | Freq: Every day | ORAL | 0 refills | Status: DC

## 2022-12-10 MED ORDER — SEMAGLUTIDE (2 MG/DOSE) 8 MG/3ML ~~LOC~~ SOPN: 2.0000 mg | PEN_INJECTOR | SUBCUTANEOUS | 2 refills | Status: DC

## 2022-12-10 MED ORDER — ROSUVASTATIN CALCIUM 5 MG PO TABS: 5.0000 mg | ORAL_TABLET | Freq: Every day | ORAL | 1 refills | Status: DC

## 2022-12-10 NOTE — Progress Notes (Signed)
Established Patient Office Visit  Subjective   Patient ID: Jack Porter, male    DOB: 03-20-1981  Age: 42 y.o. MRN: [id number]  Chief Complaint  Patient presents with   Medical Management of Chronic Issues    Diabetes He presents for his follow-up diabetic visit. He has type 2 diabetes mellitus. No MedicAlert identification noted. His disease course has been fluctuating. Pertinent negatives for hypoglycemia include no confusion, dizziness, headaches, hunger, mood changes, nervousness/anxiousness, pallor, seizures, sleepiness, speech difficulty, sweats or tremors. Associated symptoms include polyuria. Pertinent negatives for diabetes include no blurred vision, no chest pain, no fatigue, no foot paresthesias, no visual change and no weakness. Pertinent negatives for hypoglycemia complications include no blackouts and no hospitalization. Symptoms are worsening. Risk factors for coronary artery disease include diabetes mellitus, dyslipidemia, male sex, obesity and sedentary lifestyle. Current diabetic treatment includes oral agent (dual therapy) and diet. He is compliant with treatment all of the time. His weight is stable. He is following a generally unhealthy diet. When asked about meal planning, he reported none. He has not had a previous visit with a dietitian. He rarely participates in exercise. His home blood glucose trend is increasing steadily. An ACE inhibitor/angiotensin II receptor blocker is not being taken. He does not see a podiatrist.Eye exam is current.  UTD on eye doctor visit.  Denies nonhealing wounds.  States that when he takes his BG it averages 240  Dyslipidemia Takes crestor. Denies side effects. Had foot pain, resolved with new insoles. Ate today.   Review of Systems  Constitutional:  Negative for fatigue.  Eyes:  Negative for blurred vision.  Cardiovascular:  Negative for chest pain.  Skin:  Negative for pallor.  Neurological:  Negative for dizziness, tremors,  seizures, speech difficulty, weakness and headaches.  Psychiatric/Behavioral:  Negative for confusion. The patient is not nervous/anxious.    Objective:   BP 129/82   Pulse (!) 103   Temp 98.9 F (37.2 C)   Ht '6\' 2"'$  (1.88 m)   Wt (!) 304 lb (137.9 kg)   SpO2 96%   BMI 39.03 kg/m  BP Readings from Last 3 Encounters:  12/10/22 129/82  12/05/22 128/78  09/09/22 138/86   Physical Exam Constitutional:      General: He is not in acute distress.    Appearance: Normal appearance. He is not ill-appearing, toxic-appearing or diaphoretic.  Cardiovascular:     Rate and Rhythm: Normal rate.     Pulses: Normal pulses.     Heart sounds: Normal heart sounds. No murmur heard.    No gallop.  Pulmonary:     Effort: Pulmonary effort is normal. No respiratory distress.     Breath sounds: Normal breath sounds. No stridor. No wheezing, rhonchi or rales.  Skin:    General: Skin is warm.     Capillary Refill: Capillary refill takes less than 2 seconds.  Neurological:     General: No focal deficit present.     Mental Status: He is alert and oriented to person, place, and time. Mental status is at baseline.     Motor: No weakness.  Psychiatric:        Mood and Affect: Mood normal.        Behavior: Behavior normal.        Thought Content: Thought content normal.        Judgment: Judgment normal.     The 10-year ASCVD risk score (Arnett DK, et al., 2019) is: 3.4%    Assessment &  Plan:  1. Type 2 diabetes mellitus without complication, without long-term current use of insulin (HCC) Labs as below. Will communicate results to patient once available.  Will start losartan as below for renal protection. Pt previously on lisinopril, discontinued. Will increase medication as below. Discussed side effects with patient.  - CMP14+EGFR - CBC with Differential/Platelet - Bayer DCA Hb A1c Waived - losartan (COZAAR) 25 MG tablet; Take 1 tablet (25 mg total) by mouth daily.  Dispense: 90 tablet; Refill:  0 - Semaglutide, 2 MG/DOSE, 8 MG/3ML SOPN; Inject 2 mg as directed once a week.  Dispense: 3 mL; Refill: 2  2. Morbid obesity ([place]) Pt meets criteria for morbid obesity with BMI >35 and co-morbidities of Diabetes and Hyperlipidemia. Will discuss with patient following with dietician for assistance with diabetes and weight loss.   3. Hyperlipidemia associated with type 2 diabetes mellitus ([place]) Continue medication as below. Pt to return for Lipid Panel when he is fasting.  - rosuvastatin (CRESTOR) 5 MG tablet; Take 1 tablet (5 mg total) by mouth daily.  Dispense: 90 tablet; Refill: 1 - Lipid panel; Future  Previously elevated BP. Pt states that he has not ever been diagnosed with Hypertension. Pt previously taking lisinopril, but discontinued for unknown reason. Discussed with patient monitoring BP at home. Provided BP log to patient in clinic today. Instructed pt to take BP first thing in the morning after sitting for 5 minutes with feet flat on the floor, arm at heart level. Discussed with patient options for BP cuff at [place], [employer], [place]. Pt verbalized they were able to obtain BP cuff. Will review measurements with patient at follow up and determine plan for BP management.    The above assessment and management plan was discussed with the patient. The patient verbalized understanding of and has agreed to the management plan using shared-decision making. Patient is aware to call the clinic if they develop any new symptoms or if symptoms fail to improve or worsen. Patient is aware when to return to the clinic for a follow-up visit. Patient educated on when it is appropriate to go to the emergency department.   Donzetta Kohut, DNP-FNP [facility] [address] [place], [place] [address] [phone]

## 2022-12-11 LAB — CMP14+EGFR
ALT: 34 IU/L (ref 0–44)
AST: 35 IU/L (ref 0–40)
Albumin/Globulin Ratio: 1.5 (ref 1.2–2.2)
Albumin: 4.1 g/dL (ref 4.1–5.1)
Alkaline Phosphatase: 149 IU/L — ABNORMAL HIGH (ref 44–121)
BUN/Creatinine Ratio: 19 (ref 9–20)
BUN: 10 mg/dL (ref 6–24)
Bilirubin Total: 1.7 mg/dL — ABNORMAL HIGH (ref 0.0–1.2)
CO2: 21 mmol/L (ref 20–29)
Calcium: 9 mg/dL (ref 8.7–10.2)
Chloride: 103 mmol/L (ref 96–106)
Creatinine, Ser: 0.53 mg/dL — ABNORMAL LOW (ref 0.76–1.27)
Globulin, Total: 2.8 g/dL (ref 1.5–4.5)
Glucose: 363 mg/dL — ABNORMAL HIGH (ref 70–99)
Potassium: 4.2 mmol/L (ref 3.5–5.2)
Sodium: 137 mmol/L (ref 134–144)
Total Protein: 6.9 g/dL (ref 6.0–8.5)
eGFR: 128 mL/min/{1.73_m2} (ref >59)

## 2022-12-11 LAB — CBC WITH DIFFERENTIAL/PLATELET
Basophils Absolute: 0 10*3/uL (ref 0.0–0.2)
Basos: 1 %
EOS (ABSOLUTE): 0.1 10*3/uL (ref 0.0–0.4)
Eos: 2 %
Hematocrit: 49.6 % (ref 37.5–51.0)
Hemoglobin: 16.1 g/dL (ref 13.0–17.7)
Immature Grans (Abs): 0 10*3/uL (ref 0.0–0.1)
Immature Granulocytes: 0 %
Lymphocytes Absolute: 2 10*3/uL (ref 0.7–3.1)
Lymphs: 42 %
MCH: 28.9 pg (ref 26.6–33.0)
MCHC: 32.5 g/dL (ref 31.5–35.7)
MCV: 89 fL (ref 79–97)
Monocytes Absolute: 0.3 10*3/uL (ref 0.1–0.9)
Monocytes: 6 %
Neutrophils Absolute: 2.4 10*3/uL (ref 1.4–7.0)
Neutrophils: 49 %
Platelets: 128 10*3/uL — ABNORMAL LOW (ref 150–450)
RBC: 5.58 x10E6/uL (ref 4.14–5.80)
RDW: 12.9 % (ref 11.6–15.4)
WBC: 4.8 10*3/uL (ref 3.4–10.8)

## 2022-12-11 NOTE — Addendum Note (Signed)
Addended by: Donzetta Kohut on: 12/11/2022 04:16 PM   Modules accepted: Orders

## 2022-12-23 ENCOUNTER — Ambulatory Visit (HOSPITAL_COMMUNITY)
Admission: RE | Admit: 2022-12-23 | Discharge: 2022-12-23 | Disposition: A | Discharge status: Home / Self Care | Payer: BC Managed Care – PPO | Source: Ambulatory Visit | Attending: Family Medicine | Admitting: Family Medicine

## 2022-12-23 DIAGNOSIS — R945 Abnormal results of liver function studies: Secondary | ICD-10-CM | POA: Diagnosis not present

## 2022-12-23 DIAGNOSIS — R899 Unspecified abnormal finding in specimens from other organs, systems and tissues: Secondary | ICD-10-CM | POA: Insufficient documentation

## 2022-12-24 ENCOUNTER — Ambulatory Visit: Payer: BC Managed Care – PPO | Admitting: Family Medicine

## 2022-12-26 ENCOUNTER — Encounter: Payer: Self-pay | Admitting: Family Medicine

## 2023-01-17 ENCOUNTER — Ambulatory Visit: Payer: BC Managed Care – PPO | Admitting: Family Medicine

## 2023-01-30 ENCOUNTER — Ambulatory Visit: Payer: BC Managed Care – PPO | Admitting: Family Medicine

## 2023-01-30 ENCOUNTER — Encounter: Payer: Self-pay | Admitting: Family Medicine

## 2023-01-30 VITALS — BP 107/73 | HR 103 | Temp 98.7°F | Ht 74.0 in | Wt 298.0 lb

## 2023-01-30 DIAGNOSIS — E119 Type 2 diabetes mellitus without complications: Secondary | ICD-10-CM | POA: Diagnosis not present

## 2023-01-30 DIAGNOSIS — K429 Umbilical hernia without obstruction or gangrene: Secondary | ICD-10-CM

## 2023-01-30 DIAGNOSIS — D696 Thrombocytopenia, unspecified: Secondary | ICD-10-CM | POA: Diagnosis not present

## 2023-01-30 DIAGNOSIS — R000 Tachycardia, unspecified: Secondary | ICD-10-CM | POA: Diagnosis not present

## 2023-01-30 DIAGNOSIS — R748 Abnormal levels of other serum enzymes: Secondary | ICD-10-CM

## 2023-01-30 DIAGNOSIS — Z6838 Body mass index (BMI) 38.0-38.9, adult: Secondary | ICD-10-CM

## 2023-01-30 DIAGNOSIS — E782 Mixed hyperlipidemia: Secondary | ICD-10-CM

## 2023-01-30 DIAGNOSIS — K760 Fatty (change of) liver, not elsewhere classified: Secondary | ICD-10-CM

## 2023-01-30 MED ORDER — GLYBURIDE-METFORMIN 5-500 MG PO TABS: 1.0000 | ORAL_TABLET | Freq: Every day | ORAL | 0 refills | Status: DC

## 2023-01-30 MED ORDER — SEMAGLUTIDE (1 MG/DOSE) 4 MG/3ML ~~LOC~~ SOPN
1.0000 mg | PEN_INJECTOR | SUBCUTANEOUS | 2 refills | Status: DC
Start: 2023-01-30 — End: 2023-03-12

## 2023-01-30 NOTE — Progress Notes (Signed)
Acute Office Visit  Subjective:  Patient ID: Jack Porter, male    DOB: 1980/12/20, 42 y.o.   MRN: [id number]  Chief Complaint  Patient presents with   Medical Management of Chronic Issues    2 month follow up Unable to tolerate high dose ozempic   HPI Patient is in today for follow up of chronic conditions   Diabetes  Type 2 Diabetes with HTN, HLD, morbid obesity  Glucometer: Accucheck.   High at home: 230; Low at home: 170, Taking medication(s): glucovance and semaglutide, unable to tolerate high dose, stopped it 3 weeks ago. No hypoglycemic events   Last eye exam: 6 months ago, [facility]  Maurice Small foot exam: UTD  Last A1c:  Lab Results  Component Value Date   HGBA1C 10.2 (H) 12/10/2022   Nephropathy screen indicated?: yes  Last flu, zoster and/or pneumovax:  Immunization History  Administered Date(s) Administered   MODERNA SARS COV-2 Pediatric Vaccination 6mos to <46yrs 01/06/2020, 02/03/2020   Moderna Sars-Covid-2 Vaccination 01/06/2020, 02/03/2020, 02/02/2021    ROS: No dizziness, LOC, polyuria, polydipsia, unintended weight loss/gain, foot ulcerations, numbness or tingling in extremities, shortness of breath or chest pain.  Hepatic Steatosis  Tolerating crestor well. Has made diet and exercise changes.   Blood Pressure  Has been checking his BP at home. Is using an upper arm monitor. Reports that it has been in range. Forgot his log today, but can send it in Mychart.  Denies changes to vision, swelling in legs, palpitations, SOB.   Obesity  Has started working out with treadmill and bike every day.   Hernia  Reports that he had a little pain years ago. Now that he is working out it protruding out. States that it stays protruding out and does not go back in. Continues to have pain. Denies changes to appetite, BM. Denies fever.   Thrombocytopenia Denies easy bruising, nose bleeds. Has not had formal workup in the past.   ROS As per HPI   Objective:  BP  107/73   Pulse (!) 103   Temp 98.7 F (37.1 C)   Ht  (1.88 m)   Wt 298 lb (135.2 kg)   SpO2 98%   BMI 38.26 kg/m   Physical Exam Constitutional:      General: He is awake. He is not in acute distress.    Appearance: Normal appearance. He is well-developed and well-groomed. He is obese. He is not ill-appearing, toxic-appearing or diaphoretic.  Cardiovascular:     Rate and Rhythm: Normal rate.     Pulses: Normal pulses.     Heart sounds: Normal heart sounds. No murmur heard.    No gallop.  Pulmonary:     Effort: Pulmonary effort is normal. No respiratory distress.     Breath sounds: Normal breath sounds. No stridor. No wheezing, rhonchi or rales.  Abdominal:     General: Bowel sounds are normal.     Palpations: Abdomen is soft.     Hernia: A hernia is present. Hernia is present in the umbilical area.  Skin:    General: Skin is warm.     Capillary Refill: Capillary refill takes less than 2 seconds.  Neurological:     General: No focal deficit present.     Mental Status: He is alert and oriented to person, place, and time. Mental status is at baseline.     Motor: No weakness.  Psychiatric:        Attention and Perception: Attention and  perception normal.        Mood and Affect: Mood and affect normal.        Speech: Speech normal.        Behavior: Behavior normal. Behavior is cooperative.        Thought Content: Thought content normal.        Cognition and Memory: Cognition and memory normal.        Judgment: Judgment normal.      Assessment & Plan:  1. Type 2 diabetes mellitus without complication, without long-term current use of insulin Labs as below. Will communicate results to patient once available.  Will come back tomorrow when he is fasting to complete all labs.  Patient was not able to tolerate higher dose of semaglutide, will reduce his dose down. Discussed with patient increasing his dose of metformin. Patient is amenable. Will continue to discuss at  follow up.  - CBC with Differential/Platelet; Future - CMP14+EGFR; Future - glyBURIDE-metformin (GLUCOVANCE) 5-500 MG tablet; Take 1 tablet by mouth daily with breakfast.  Dispense: 90 tablet; Refill: 0 - Semaglutide, 1 MG/DOSE, 4 MG/3ML SOPN; Inject 1 mg as directed once a week for 12 doses.  Dispense: 3 mL; Refill: 2 - Microalbumin / creatinine urine ratio; Future  2. Tachycardia Patient is asymptomatically tachycardic. He has not had a formal workup. EKG reviewed by myself and Kari Baars, FNP today in office. No significant abnormalities. Referral placed for patient to become established with cardiology given his significant comorbid conditions.  - TSH; Future - EKG 12-Lead - Ambulatory referral to Cardiology  3. Umbilical hernia without obstruction and without gangrene Order placed for CT as recommended by radiology from patient [place]. Referral placed for General Surgery for evaluation. Discussed with patient red flags.  - CT Abdomen Pelvis Wo Contrast; Future - Ambulatory referral to General Surgery  4. Thrombocytopenia Will repeat labs as above and consider referral to hematology given that patient has had long history of thrombocytopenia   5. Morbid obesity Praised patient for beginning to exercise. Will encourage patient to continue to exercise and make lifestyle changes   6. Mixed hyperlipidemia Previously placed order for Lipid panel. Patient plans to come back tomorrow for labs while he is fasting.   7. Elevated liver enzymes Will monitor with labs as above. Coming tomorrow for all labs while fasting.   8. Hepatic steatosis Will optimize patient comorbid condition. CT has been ordered to determine severity. Will plan to refer patient to hepatology if needed based on results of CT.    The above assessment and management plan was discussed with the patient. The patient verbalized understanding of and has agreed to the management plan using shared-decision making. Patient  is aware to call the clinic if they develop any new symptoms or if symptoms fail to improve or worsen. Patient is aware when to return to the clinic for a follow-up visit. Patient educated on when it is appropriate to go to the emergency department.   Follow up in one month for chronic conditions   Neale Burly, DNP-FNP [facility] [address] [place], [place] [address] [phone]

## 2023-01-31 ENCOUNTER — Other Ambulatory Visit: Payer: BC Managed Care – PPO

## 2023-01-31 DIAGNOSIS — E119 Type 2 diabetes mellitus without complications: Secondary | ICD-10-CM | POA: Diagnosis not present

## 2023-01-31 DIAGNOSIS — E785 Hyperlipidemia, unspecified: Secondary | ICD-10-CM | POA: Diagnosis not present

## 2023-01-31 DIAGNOSIS — R000 Tachycardia, unspecified: Secondary | ICD-10-CM | POA: Diagnosis not present

## 2023-01-31 DIAGNOSIS — E1169 Type 2 diabetes mellitus with other specified complication: Secondary | ICD-10-CM

## 2023-02-01 LAB — CMP14+EGFR
ALT: 31 IU/L (ref 0–44)
AST: 34 IU/L (ref 0–40)
Albumin/Globulin Ratio: 1.5 (ref 1.2–2.2)
Albumin: 4.1 g/dL (ref 4.1–5.1)
Alkaline Phosphatase: 137 IU/L — ABNORMAL HIGH (ref 44–121)
BUN/Creatinine Ratio: 18 (ref 9–20)
BUN: 11 mg/dL (ref 6–24)
Bilirubin Total: 1.6 mg/dL — ABNORMAL HIGH (ref 0.0–1.2)
CO2: 21 mmol/L (ref 20–29)
Calcium: 9.2 mg/dL (ref 8.7–10.2)
Chloride: 104 mmol/L (ref 96–106)
Creatinine, Ser: 0.61 mg/dL — ABNORMAL LOW (ref 0.76–1.27)
Globulin, Total: 2.8 g/dL (ref 1.5–4.5)
Glucose: 243 mg/dL — ABNORMAL HIGH (ref 70–99)
Potassium: 4.6 mmol/L (ref 3.5–5.2)
Sodium: 142 mmol/L (ref 134–144)
Total Protein: 6.9 g/dL (ref 6.0–8.5)
eGFR: 123 mL/min/{1.73_m2} (ref >59)

## 2023-02-01 LAB — MICROALBUMIN / CREATININE URINE RATIO
Creatinine, Urine: 102 mg/dL
Microalb/Creat Ratio: 20 mg/g creat (ref 0–29)
Microalbumin, Urine: 20.4 ug/mL

## 2023-02-01 LAB — CBC WITH DIFFERENTIAL/PLATELET
Basophils Absolute: 0 10*3/uL (ref 0.0–0.2)
Basos: 1 %
EOS (ABSOLUTE): 0.1 10*3/uL (ref 0.0–0.4)
Eos: 3 %
Hematocrit: 49.1 % (ref 37.5–51.0)
Hemoglobin: 16.6 g/dL (ref 13.0–17.7)
Immature Grans (Abs): 0 10*3/uL (ref 0.0–0.1)
Immature Granulocytes: 0 %
Lymphocytes Absolute: 2.1 10*3/uL (ref 0.7–3.1)
Lymphs: 45 %
MCH: 30.2 pg (ref 26.6–33.0)
MCHC: 33.8 g/dL (ref 31.5–35.7)
MCV: 89 fL (ref 79–97)
Monocytes Absolute: 0.3 10*3/uL (ref 0.1–0.9)
Monocytes: 6 %
Neutrophils Absolute: 2.1 10*3/uL (ref 1.4–7.0)
Neutrophils: 45 %
Platelets: 138 10*3/uL — ABNORMAL LOW (ref 150–450)
RBC: 5.49 x10E6/uL (ref 4.14–5.80)
RDW: 12.7 % (ref 11.6–15.4)
WBC: 4.7 10*3/uL (ref 3.4–10.8)

## 2023-02-01 LAB — LIPID PANEL
Chol/HDL Ratio: 4 ratio (ref 0.0–5.0)
Cholesterol, Total: 140 mg/dL (ref 100–199)
HDL: 35 mg/dL — ABNORMAL LOW (ref >39)
LDL Chol Calc (NIH): 84 mg/dL (ref 0–99)
Triglycerides: 117 mg/dL (ref 0–149)
VLDL Cholesterol Cal: 21 mg/dL (ref 5–40)

## 2023-02-01 LAB — TSH: TSH: 2.42 u[IU]/mL (ref 0.450–4.500)

## 2023-02-14 ENCOUNTER — Ambulatory Visit (HOSPITAL_BASED_OUTPATIENT_CLINIC_OR_DEPARTMENT_OTHER): Payer: BC Managed Care – PPO

## 2023-02-27 ENCOUNTER — Ambulatory Visit: Payer: BC Managed Care – PPO | Admitting: Family Medicine

## 2023-03-04 ENCOUNTER — Ambulatory Visit: Payer: BC Managed Care – PPO | Admitting: General Surgery

## 2023-03-05 ENCOUNTER — Ambulatory Visit: Payer: BC Managed Care – PPO | Admitting: Family Medicine

## 2023-03-12 ENCOUNTER — Ambulatory Visit (HOSPITAL_BASED_OUTPATIENT_CLINIC_OR_DEPARTMENT_OTHER): Admission: RE | Admit: 2023-03-12 | Payer: BC Managed Care – PPO | Source: Ambulatory Visit

## 2023-03-12 ENCOUNTER — Ambulatory Visit: Payer: BC Managed Care – PPO | Admitting: Family Medicine

## 2023-03-12 ENCOUNTER — Encounter: Payer: Self-pay | Admitting: Family Medicine

## 2023-03-12 VITALS — BP 122/85 | HR 99 | Temp 97.0°F | Ht 73.0 in | Wt 299.4 lb

## 2023-03-12 DIAGNOSIS — E785 Hyperlipidemia, unspecified: Secondary | ICD-10-CM

## 2023-03-12 DIAGNOSIS — K429 Umbilical hernia without obstruction or gangrene: Secondary | ICD-10-CM

## 2023-03-12 DIAGNOSIS — Z6839 Body mass index (BMI) 39.0-39.9, adult: Secondary | ICD-10-CM

## 2023-03-12 DIAGNOSIS — E119 Type 2 diabetes mellitus without complications: Secondary | ICD-10-CM | POA: Diagnosis not present

## 2023-03-12 DIAGNOSIS — R030 Elevated blood-pressure reading, without diagnosis of hypertension: Secondary | ICD-10-CM

## 2023-03-12 DIAGNOSIS — Z7985 Long-term (current) use of injectable non-insulin antidiabetic drugs: Secondary | ICD-10-CM

## 2023-03-12 DIAGNOSIS — E1169 Type 2 diabetes mellitus with other specified complication: Secondary | ICD-10-CM

## 2023-03-12 LAB — BAYER DCA HB A1C WAIVED: HB A1C (BAYER DCA - WAIVED): 9.7 % — ABNORMAL HIGH (ref 4.8–5.6)

## 2023-03-12 MED ORDER — LOSARTAN POTASSIUM 25 MG PO TABS: 25.0000 mg | ORAL_TABLET | Freq: Every day | ORAL | 0 refills | Status: DC

## 2023-03-12 MED ORDER — SEMAGLUTIDE (2 MG/DOSE) 8 MG/3ML ~~LOC~~ SOPN
2.0000 mg | PEN_INJECTOR | SUBCUTANEOUS | 2 refills | Status: DC
Start: 2023-03-12 — End: 2023-06-12

## 2023-03-12 NOTE — Progress Notes (Signed)
Reviewed lab with patient in office. Increased dose of semaglutide. Will repeat labs in 3 months at chronic condition follow up

## 2023-03-12 NOTE — Progress Notes (Signed)
Acute Office Visit  Subjective:  Patient ID: Jack Porter, male    DOB: 11/03/1980, 42 y.o.   MRN: [id number]  Chief Complaint  Patient presents with   Medical Management of Chronic Issues   Diabetes   Patient is in today for follow up of chronic conditions  Type 2 Diabetes with hypertriglyceridemia  Glucometer: has fingerstick, unsure of the brand, purchased at [facility] at home: 235; Low at home: 215, Taking medication(s): ozempic. Remembers to take his BG in the daytime, not in the morning. States that it was hard for him in May because of all the social gatherings.  Has been exercising at the park 2 times by walking. Walking ~4 miles to 5.2 miles.  States that he is trying to cut out sodas and bread, but it is most difficult for him to decrease the soda.  Last eye exam: 06/26/22 Last foot exam: 01/31/23 Last A1c:  Lab Results  Component Value Date   HGBA1C 10.2 (H) 12/10/2022   Nephropathy screen indicated?: 01/31/23 Last flu, zoster and/or pneumovax:  Immunization History  Administered Date(s) Administered   MODERNA SARS COV-2 Pediatric Vaccination 6mos to <16yrs 01/06/2020, 02/03/2020   Moderna Sars-Covid-2 Vaccination 01/06/2020, 02/03/2020, 02/02/2021   ROS: Denies dizziness, LOC, polyuria, unintended weight loss/gain, foot ulcerations, numbness or tingling in extremities, shortness of breath or chest pain. Endorses polydipsia with increased water intake.   Blood pressure monitor  BP at home average - states that his wife checks it daily and he checks it at work. He also checks it before bed. States that it is within range.   ROS Denies anxiety, fatigue, peripheral edema, changes to vision, chest pain, headaches, palpitations, sweats, SOB, PND, orthopnea, neck pain,  Meds none  CAD risks - obesity, male, diabetes, HLD, hepatic steatosis.   Hernia  Following with GI in [place]. Still reducible.   ROS As per HPI  Objective:  BP 122/85   Pulse 99   Temp  (!) 97 F (36.1 C) (Temporal)   Ht 6\' 1"  (1.854 m)   Wt 299 lb 6.4 oz (135.8 kg)   SpO2 98%   BMI 39.50 kg/m   Physical Exam Constitutional:      General: He is awake. He is not in acute distress.    Appearance: Normal appearance. He is well-developed and well-groomed. He is not ill-appearing, toxic-appearing or diaphoretic.  Cardiovascular:     Rate and Rhythm: Normal rate.     Pulses: Normal pulses.          Radial pulses are 2+ on the right side and 2+ on the left side.       Posterior tibial pulses are 2+ on the right side and 2+ on the left side.     Heart sounds: Normal heart sounds. No murmur heard.    No gallop.  Pulmonary:     Effort: Pulmonary effort is normal. No respiratory distress.     Breath sounds: Normal breath sounds. No stridor. No wheezing, rhonchi or rales.  Abdominal:     Tenderness: There is no abdominal tenderness.     Hernia: A hernia is present. Hernia is present in the umbilical area.  Musculoskeletal:     Cervical back: Full passive range of motion without pain and neck supple.     Right lower leg: No edema.     Left lower leg: No edema.  Skin:    General: Skin is warm.     Capillary Refill: Capillary refill  takes less than 2 seconds.  Neurological:     General: No focal deficit present.     Mental Status: He is alert, oriented to person, place, and time and easily aroused. Mental status is at baseline.     GCS: GCS eye subscore is 4. GCS verbal subscore is 5. GCS motor subscore is 6.     Motor: No weakness.  Psychiatric:        Attention and Perception: Attention and perception normal.        Mood and Affect: Mood and affect normal.        Speech: Speech normal.        Behavior: Behavior normal. Behavior is cooperative.        Thought Content: Thought content normal. Thought content does not include homicidal or suicidal ideation. Thought content does not include homicidal or suicidal plan.        Cognition and Memory: Cognition and memory  normal.        Judgment: Judgment normal.       03/12/2023    3:17 PM 03/12/2023    3:14 PM 01/30/2023    3:10 PM  Depression screen PHQ 2/9  Decreased Interest 0 0 0  Down, Depressed, Hopeless 0 0 0  PHQ - 2 Score 0 0 0  Altered sleeping  0 0  Tired, decreased energy  0 0  Change in appetite  0 0  Feeling bad or failure about yourself   0 0  Trouble concentrating  0 0  Moving slowly or fidgety/restless  0 0  Suicidal thoughts  0 0  PHQ-9 Score  0 0  Difficult doing work/chores  Not difficult at all Not difficult at all      03/12/2023    3:14 PM 01/30/2023    3:11 PM 12/10/2022    3:44 PM 12/05/2022    3:37 PM  GAD 7 : Generalized Anxiety Score  Nervous, Anxious, on Edge 0 0 0 0  Control/stop worrying 0 0 0 0  Worry too much - different things 0 0 0 0  Trouble relaxing 0 0 0 0  Restless 0 0 0 0  Easily annoyed or irritable 0 0 0 0  Afraid - awful might happen 0 0 0 0  Total GAD 7 Score 0 0 0 0  Anxiety Difficulty Not difficult at all Not difficult at all Not difficult at all Not difficult at all   Assessment & Plan:  1. Type 2 diabetes mellitus without complication, without long-term current use of insulin (HCC) A1C improving but is not at goal. Will now to increase dose to 2 mg of semaglutide. Will increase as below. Instructed patient not to pick up refill of 1 mg. Discussed with patient monitoring BG in the am at home. Instructed patient to follow up in 3 months for Chronic condition follow up.  - Bayer DCA Hb A1c Waived - losartan (COZAAR) 25 MG tablet; Take 1 tablet (25 mg total) by mouth daily.  Dispense: 90 tablet; Refill: 0 - Semaglutide, 2 MG/DOSE, 8 MG/3ML SOPN; Inject 2 mg as directed once a week.  Dispense: 3 mL; Refill: 2  2. Hyperlipidemia associated with type 2 diabetes mellitus (HCC) Continues on crestor. Encouraged continued work on diet and exercise.   3. Morbid obesity (HCC) Discussed with patient to continue healthy lifestyle choices, including diet (rich  in fruits, vegetables, and lean proteins, and low in salt and simple carbohydrates) and exercise (at least 30 minutes of moderate physical  activity daily). Limit beverages high is sugar. Recommended at least 80-100 oz of water daily.   4. Umbilical hernia without obstruction and without gangrene Canceled his CT appt today. States that he is planning to follow up with GI providers. Reinforced red flag signs.   5. Elevated blood pressure reading Patient previously had elevated blood pressure reading without the diagnosis of hypertension. Started on losartan for renal protection. BP is at goal. Will continue to monitor and instructed patient to follow at home.   The above assessment and management plan was discussed with the patient. The patient verbalized understanding of and has agreed to the management plan using shared-decision making. Patient is aware to call the clinic if they develop any new symptoms or if symptoms fail to improve or worsen. Patient is aware when to return to the clinic for a follow-up visit. Patient educated on when it is appropriate to go to the emergency department.   Return in about 3 months (around 06/12/2023).  Neale Burly, DNP-FNP [facility] [address] [place], [place] [address] [phone]

## 2023-04-28 ENCOUNTER — Ambulatory Visit (HOSPITAL_BASED_OUTPATIENT_CLINIC_OR_DEPARTMENT_OTHER): Payer: BC Managed Care – PPO

## 2023-05-05 ENCOUNTER — Other Ambulatory Visit: Payer: Self-pay | Admitting: Family Medicine

## 2023-05-05 DIAGNOSIS — E119 Type 2 diabetes mellitus without complications: Secondary | ICD-10-CM

## 2023-05-05 MED ORDER — GLYBURIDE-METFORMIN 5-500 MG PO TABS
1.0000 | ORAL_TABLET | Freq: Every day | ORAL | 0 refills | Status: DC
Start: 2023-05-05 — End: 2023-06-12

## 2023-05-14 ENCOUNTER — Ambulatory Visit: Payer: BC Managed Care – PPO | Attending: Cardiology | Admitting: Cardiology

## 2023-05-14 ENCOUNTER — Encounter: Payer: Self-pay | Admitting: Cardiology

## 2023-05-14 NOTE — Progress Notes (Deleted)
Cardiology Office Note  Date: 05/14/2023   ID: Jack Porter, Jack Porter 03/14/1981, MRN [id number]  PCP: Arrie Senate, FNP  Chief Complaint: No chief complaint on file.  History of Present Illness: Jack Porter is a 42 y.o. male referred for cardiology consultation by Ms. Ellamae Sia FNP for evaluation of elevated heart rate based on limited information provided.  I reviewed the recent office notes from primary care.  Per chart review patient's listed heart rate is often in the 90-110 range going back at least over the last few years.  Review of Systems: As outlined in the history of present illness.  Past Medical History: Past Medical History:  Diagnosis Date   GERD (gastroesophageal reflux disease)    H/O hiatal hernia    Type 2 diabetes mellitus (HCC)    Past Surgical History: Past Surgical History:  Procedure Laterality Date   CHOLECYSTECTOMY N/A 01/21/2014   Procedure: LAPAROSCOPIC CHOLECYSTECTOMY;  Surgeon: Atilano Ina, MD;  Location: [facility];  Service: General;  Laterality: N/A;   Family History: Family History  Problem Relation Age of Onset   Diabetes Mother    Heart disease Sister    Diabetes Sister    Social History:  Social History   Tobacco Use   Smoking status: Former    Current packs/day: 0.25    Average packs/day: 0.3 packs/day for 3.0 years (0.8 ttl pk-yrs)    Types: Cigarettes   Smokeless tobacco: Never  Substance Use Topics   Alcohol use: No    Comment: rarely   Medications: Current Outpatient Medications on File Prior to Visit  Medication Sig Dispense Refill   blood glucose meter kit and supplies KIT Dispense based on patient and insurance preference. Use up to four times daily as directed. (FOR ICD-9 250.00, 250.01). 1 each 0   glucose blood (IGLUCOSE TEST STRIPS) test strip Use as instructed 100 each 12   glyBURIDE-metformin (GLUCOVANCE) 5-500 MG tablet Take 1 tablet by mouth daily with breakfast. 90 tablet 0   losartan (COZAAR) 25  MG tablet Take 1 tablet (25 mg total) by mouth daily. 90 tablet 0   rosuvastatin (CRESTOR) 5 MG tablet Take 1 tablet (5 mg total) by mouth daily. 90 tablet 1   Semaglutide, 2 MG/DOSE, 8 MG/3ML SOPN Inject 2 mg as directed once a week. 3 mL 2   No current facility-administered medications on file prior to visit.   Allergies: No Known Allergies Physical Exam: VS:  There were no vitals taken for this visit., BMI There is no height or weight on file to calculate BMI.  Wt Readings from Last 3 Encounters:  03/12/23 299 lb 6.4 oz (135.8 kg)  01/30/23 298 lb (135.2 kg)  12/10/22 (!) 304 lb (137.9 kg)    General: Patient appears comfortable at rest. HEENT: Conjunctiva and lids normal, oropharynx clear with moist mucosa. Neck: Supple, no elevated JVP or carotid bruits, no thyromegaly. Lungs: Clear to auscultation, nonlabored breathing at rest. Cardiac: Regular rate and rhythm, no S3 or significant systolic murmur, no pericardial rub. Abdomen: Soft, nontender, no hepatomegaly, bowel sounds present, no guarding or rebound. Extremities: No pitting edema, distal pulses 2+. Skin: Warm and dry. Musculoskeletal: No kyphosis. Neuropsychiatric: Alert and oriented x3, affect grossly appropriate.  ECG:  An ECG dated 01/30/2023 was personally reviewed today and demonstrated:  Sinus tachycardia.  Labwork: 01/31/2023: ALT 31; AST 34; BUN 11; Creatinine, Ser 0.61; Hemoglobin 16.6; Platelets 138; Potassium 4.6; Sodium 142; TSH 2.420     Component Value  Date/Time   CHOL 140 01/31/2023 0827   TRIG 117 01/31/2023 0827   HDL 35 (L) 01/31/2023 0827   CHOLHDL 4.0 01/31/2023 0827   LDLCALC 84 01/31/2023 0827   Other Studies Reviewed Today:  No prior cardiac testing for review today.  Assessment and Plan:    Disposition:  Follow up {follow up:15908}  Signed, Jonelle Sidle, M.D., F.A.C.C. [facility]

## 2023-05-15 ENCOUNTER — Encounter: Payer: Self-pay | Admitting: Cardiology

## 2023-06-11 ENCOUNTER — Other Ambulatory Visit: Payer: Self-pay | Admitting: Family Medicine

## 2023-06-11 DIAGNOSIS — E119 Type 2 diabetes mellitus without complications: Secondary | ICD-10-CM

## 2023-06-12 ENCOUNTER — Encounter: Payer: Self-pay | Admitting: Family Medicine

## 2023-06-12 ENCOUNTER — Ambulatory Visit (INDEPENDENT_AMBULATORY_CARE_PROVIDER_SITE_OTHER): Payer: BC Managed Care – PPO | Admitting: Family Medicine

## 2023-06-12 VITALS — BP 114/76 | HR 107 | Temp 98.7°F | Ht 73.0 in | Wt 299.0 lb

## 2023-06-12 DIAGNOSIS — R6889 Other general symptoms and signs: Secondary | ICD-10-CM | POA: Diagnosis not present

## 2023-06-12 DIAGNOSIS — E785 Hyperlipidemia, unspecified: Secondary | ICD-10-CM | POA: Diagnosis not present

## 2023-06-12 DIAGNOSIS — D696 Thrombocytopenia, unspecified: Secondary | ICD-10-CM

## 2023-06-12 DIAGNOSIS — E781 Pure hyperglyceridemia: Secondary | ICD-10-CM | POA: Diagnosis not present

## 2023-06-12 DIAGNOSIS — K760 Fatty (change of) liver, not elsewhere classified: Secondary | ICD-10-CM

## 2023-06-12 DIAGNOSIS — E119 Type 2 diabetes mellitus without complications: Secondary | ICD-10-CM

## 2023-06-12 DIAGNOSIS — R748 Abnormal levels of other serum enzymes: Secondary | ICD-10-CM | POA: Diagnosis not present

## 2023-06-12 DIAGNOSIS — Z7985 Long-term (current) use of injectable non-insulin antidiabetic drugs: Secondary | ICD-10-CM | POA: Diagnosis not present

## 2023-06-12 DIAGNOSIS — E1169 Type 2 diabetes mellitus with other specified complication: Secondary | ICD-10-CM

## 2023-06-12 DIAGNOSIS — R899 Unspecified abnormal finding in specimens from other organs, systems and tissues: Secondary | ICD-10-CM

## 2023-06-12 DIAGNOSIS — Z7984 Long term (current) use of oral hypoglycemic drugs: Secondary | ICD-10-CM

## 2023-06-12 LAB — BAYER DCA HB A1C WAIVED: HB A1C (BAYER DCA - WAIVED): 10.4 % — ABNORMAL HIGH (ref 4.8–5.6)

## 2023-06-12 MED ORDER — SEMAGLUTIDE (2 MG/DOSE) 8 MG/3ML ~~LOC~~ SOPN
2.0000 mg | PEN_INJECTOR | SUBCUTANEOUS | 2 refills | Status: DC
Start: 2023-06-12 — End: 2023-06-12

## 2023-06-12 MED ORDER — METFORMIN HCL 1000 MG PO TABS: 1000.0000 mg | ORAL_TABLET | Freq: Two times a day (BID) | ORAL | 2 refills | Status: DC

## 2023-06-12 MED ORDER — DAPAGLIFLOZIN PROPANEDIOL 5 MG PO TABS
5.0000 mg | ORAL_TABLET | Freq: Every day | ORAL | 0 refills | Status: AC
Start: 2023-06-12 — End: 2023-07-12

## 2023-06-12 MED ORDER — GLIPIZIDE 5 MG PO TABS
5.0000 mg | ORAL_TABLET | Freq: Every day | ORAL | 0 refills | Status: DC
Start: 2023-06-12 — End: 2023-12-11

## 2023-06-12 MED ORDER — TIRZEPATIDE 7.5 MG/0.5ML ~~LOC~~ SOAJ
7.5000 mg | SUBCUTANEOUS | 0 refills | Status: DC
Start: 2023-06-12 — End: 2023-07-10

## 2023-06-12 NOTE — Progress Notes (Addendum)
Subjective:  Patient ID: Jack Porter, male    DOB: Nov 04, 1980, 42 y.o.   MRN: [id number]  Patient Care Team: Arrie Senate, FNP as PCP - General (Family Medicine) Delora Fuel, OD (Optometry)   Chief Complaint:  Medical Management of Chronic Issues   HPI: Jack Porter is a 42 y.o. male presenting on 06/12/2023 for Medical Management of Chronic Issues  HPI Type 2 diabetes mellitus without complication, without long-term current use of insulin (HCC) Glucometer: Accucheck.   High at home: 280; Low at home: 170, Taking medication(s): glucovance and semlaglutide,.  Last eye exam: due  Last foot exam: completed today 06/12/23  Last A1c:  Lab Results  Component Value Date   HGBA1C 9.7 (H) 03/12/2023   Nephropathy screen indicated?: completed in April 2024 Last flu, zoster and/or pneumovax:  Immunization History  Administered Date(s) Administered  . MODERNA SARS COV-2 Pediatric Vaccination 6mos to <51yrs 01/06/2020, 02/03/2020  . Moderna Sars-Covid-2 Vaccination 01/06/2020, 02/03/2020, 02/02/2021    ROS: Denies dizziness, LOC, polyuria, polydipsia, unintended weight loss/gain, foot ulcerations, numbness or tingling in extremities, shortness of breath or chest pain.  Hyperlipidemia associated with type 2 diabetes mellitus (HCC) Tolerating well. Trying to make lifestyle changes   Morbid obesity (HCC) Reports that he had 6 weddings this year and birthday parties that inhibited his dieting and exercising.   Thrombocytopenia (HCC) Has never. Denies trouble with bleeding.   6. Elevated liver enzymes Reports that he was drinking a lot of alcohol during   Relevant past medical, surgical, family, and social history reviewed and updated as indicated.  Allergies and medications reviewed and updated. Data reviewed: Chart in Epic.  Past Medical History:  Diagnosis Date  . GERD (gastroesophageal reflux disease)   . H/O hiatal hernia   . Type 2 diabetes mellitus  (HCC)    Past Surgical History:  Procedure Laterality Date  . CHOLECYSTECTOMY N/A 01/21/2014   Procedure: LAPAROSCOPIC CHOLECYSTECTOMY;  Surgeon: Atilano Ina, MD;  Location: [facility];  Service: General;  Laterality: N/A;   Social History   Socioeconomic History  . Marital status: Married    Spouse name: Not on file  . Number of children: Not on file  . Years of education: Not on file  . Highest education level: 12th grade  Occupational History  . Not on file  Tobacco Use  . Smoking status: Former    Current packs/day: 0.25    Average packs/day: 0.3 packs/day for 3.0 years (0.8 ttl pk-yrs)    Types: Cigarettes  . Smokeless tobacco: Never  Vaping Use  . Vaping status: Never Used  Substance and Sexual Activity  . Alcohol use: No    Comment: rarely  . Drug use: No  . Sexual activity: Yes  Other Topics Concern  . Not on file  Social History Narrative  . Not on file   Social Determinants of Health   Financial Resource Strain: Low Risk  (06/11/2023)   Overall Financial Resource Strain (CARDIA)   . Difficulty of Paying Living Expenses: Not hard at all  Food Insecurity: No Food Insecurity (06/11/2023)   Hunger Vital Sign   . Worried About Programme researcher, broadcasting/film/video in the Last Year: Never true   . Ran Out of Food in the Last Year: Never true  Transportation Needs: No Transportation Needs (06/11/2023)   PRAPARE - Transportation   . Lack of Transportation (Medical): No   . Lack of Transportation (Non-Medical): No  Physical Activity: Insufficiently Active (06/11/2023)  Exercise Vital Sign   . Days of Exercise per Week: 2 days   . Minutes of Exercise per Session: 40 min  Stress: No Stress Concern Present (06/11/2023)   [employer] of Occupational Health - Occupational Stress Questionnaire   . Feeling of Stress : Not at all  Social Connections: Moderately Integrated (06/11/2023)   Social Connection and Isolation Panel [NHANES]   . Frequency of Communication with Friends and Family:  More than three times a week   . Frequency of Social Gatherings with Friends and Family: More than three times a week   . Attends Religious Services: 1 to 4 times per year   . Active Member of Clubs or Organizations: No   . Attends Banker Meetings: Not on file   . Marital Status: Married  Catering manager Violence: Not on file    Outpatient Encounter Medications as of 06/12/2023  Medication Sig  . blood glucose meter kit and supplies KIT Dispense based on patient and insurance preference. Use up to four times daily as directed. (FOR ICD-9 250.00, 250.01).  Marland Kitchen glucose blood (IGLUCOSE TEST STRIPS) test strip Use as instructed  . glyBURIDE-metformin (GLUCOVANCE) 5-500 MG tablet Take 1 tablet by mouth daily with breakfast.  . losartan (COZAAR) 25 MG tablet Take 1 tablet by mouth once daily  . rosuvastatin (CRESTOR) 5 MG tablet Take 1 tablet (5 mg total) by mouth daily.  . Semaglutide, 2 MG/DOSE, 8 MG/3ML SOPN Inject 2 mg as directed once a week.   No facility-administered encounter medications on file as of 06/12/2023.    No Known Allergies  Review of Systems As per HPI   Objective:  BP 114/76   Pulse (!) 107   Temp 98.7 F (37.1 C)   Ht 6\' 1"  (1.854 m)   Wt 299 lb (135.6 kg)   SpO2 97%   BMI 39.45 kg/m    Wt Readings from Last 3 Encounters:  06/12/23 299 lb (135.6 kg)  03/12/23 299 lb 6.4 oz (135.8 kg)  01/30/23 298 lb (135.2 kg)    Physical Exam Constitutional:      General: He is awake. He is not in acute distress.    Appearance: Normal appearance. He is well-developed and well-groomed. He is morbidly obese. He is not ill-appearing, toxic-appearing or diaphoretic.  Cardiovascular:     Rate and Rhythm: Normal rate.     Pulses: Normal pulses.          Radial pulses are 2+ on the right side and 2+ on the left side.       Posterior tibial pulses are 2+ on the right side and 2+ on the left side.     Heart sounds: Normal heart sounds. No murmur heard.    No  gallop.  Pulmonary:     Effort: Pulmonary effort is normal. No respiratory distress.     Breath sounds: Normal breath sounds. No stridor. No wheezing, rhonchi or rales.  Musculoskeletal:     Cervical back: Full passive range of motion without pain and neck supple.     Right lower leg: No edema.     Left lower leg: No edema.     Right foot: Normal range of motion. No deformity, bunion, Charcot foot, foot drop or prominent metatarsal heads.     Left foot: Normal range of motion. No deformity, bunion, Charcot foot, foot drop or prominent metatarsal heads.  Feet:     Right foot:     Protective Sensation: 10 sites tested.  10 sites sensed.     Skin integrity: Callus and dry skin present. No ulcer, blister, skin breakdown, erythema, warmth or fissure.     Toenail Condition: Right toenails are normal.     Left foot:     Protective Sensation: 10 sites tested.  10 sites sensed.     Skin integrity: Dry skin present. No ulcer, blister, skin breakdown, erythema, warmth, callus or fissure.     Toenail Condition: Left toenails are normal.     Comments: callus present on base of 4th toe, right foot Skin:    General: Skin is warm.     Capillary Refill: Capillary refill takes less than 2 seconds.  Neurological:     General: No focal deficit present.     Mental Status: He is alert, oriented to person, place, and time and easily aroused. Mental status is at baseline.     GCS: GCS eye subscore is 4. GCS verbal subscore is 5. GCS motor subscore is 6.     Motor: No weakness.  Psychiatric:        Attention and Perception: Attention and perception normal.        Mood and Affect: Mood and affect normal.        Speech: Speech normal.        Behavior: Behavior normal. Behavior is cooperative.        Thought Content: Thought content normal. Thought content does not include homicidal or suicidal ideation. Thought content does not include homicidal or suicidal plan.        Cognition and Memory: Cognition and  memory normal.        Judgment: Judgment normal.   Results for orders placed or performed in visit on 03/13/23  HM DIABETES EYE EXAM  Result Value Ref Range   HM Diabetic Eye Exam No Retinopathy No Retinopathy       06/12/2023    3:54 PM 03/12/2023    3:17 PM 03/12/2023    3:14 PM 01/30/2023    3:10 PM 12/10/2022    3:43 PM  Depression screen PHQ 2/9  Decreased Interest 0 0 0 0 0  Down, Depressed, Hopeless 0 0 0 0 0  PHQ - 2 Score 0 0 0 0 0  Altered sleeping 0  0 0 0  Tired, decreased energy 0  0 0 0  Change in appetite 0  0 0 0  Feeling bad or failure about yourself  0  0 0 0  Trouble concentrating 0  0 0 0  Moving slowly or fidgety/restless 0  0 0 0  Suicidal thoughts 0  0 0 0  PHQ-9 Score 0  0 0 0  Difficult doing work/chores Not difficult at all  Not difficult at all Not difficult at all Not difficult at all       03/12/2023    3:14 PM 01/30/2023    3:11 PM 12/10/2022    3:44 PM 12/05/2022    3:37 PM  GAD 7 : Generalized Anxiety Score  Nervous, Anxious, on Edge 0 0 0 0  Control/stop worrying 0 0 0 0  Worry too much - different things 0 0 0 0  Trouble relaxing 0 0 0 0  Restless 0 0 0 0  Easily annoyed or irritable 0 0 0 0  Afraid - awful might happen 0 0 0 0  Total GAD 7 Score 0 0 0 0  Anxiety Difficulty Not difficult at all Not difficult at all Not difficult at all Not  difficult at all    Pertinent labs & imaging results that were available during my care of the patient were reviewed by me and considered in my medical decision making.  Assessment & Plan:  Jack Porter was seen today for medical management of chronic issues.  Diagnoses and all orders for this visit:  Type 2 diabetes mellitus without complication, without long-term current use of insulin (HCC) Patient remains uncontrolled. Will stop Glucovance at this time to optimize metformin. Increased dose as below. Discussed side effects with patient.  Will continue with glipizide.  Will start farxiga as below.  Patient is  currently on max dose of ozempic, but it is not preferred by insurance.  Will plan to switch to [facility] as preferred by insurance. Discussed with pharmacist, Vanice Sarah to determine dose of mounjaro to start.  Referral placed as below to pharmacy for assistance with medications and patient education.  Labs as below. Will communicate results to patient once available. Will await results to determine next steps.  -     CMP14+EGFR -     Discontinue: Semaglutide, 2 MG/DOSE, 8 MG/3ML SOPN; Inject 2 mg as directed once a week. -     metFORMIN (GLUCOPHAGE) 1000 MG tablet; Take 1 tablet (1,000 mg total) by mouth 2 (two) times daily with a meal. -     dapagliflozin propanediol (FARXIGA) 5 MG TABS tablet; Take 1 tablet (5 mg total) by mouth daily before breakfast. -     glipiZIDE (GLUCOTROL) 5 MG tablet; Take 1 tablet (5 mg total) by mouth daily before breakfast. -     AMB Referral to Pharmacy Medication Management - tirzepatide (MOUNJARO) 7.5 MG/0.5ML Pen; Inject 7.5 mg into the skin once a week.  Dispense: 6 mL; Refill: 0   Type 2 diabetes mellitus with hypertriglyceridemia (HCC) As above  Labs as below. Will communicate results to patient once available. Will await results to determine next steps.  -     Bayer DCA Hb A1c Waived -     VITAMIN D 25 Hydroxy (Vit-D Deficiency, Fractures) -     Lipid panel -     AMB Referral to Pharmacy Medication Management - tirzepatide (MOUNJARO) 7.5 MG/0.5ML Pen; Inject 7.5 mg into the skin once a week.  Dispense: 6 mL; Refill: 0  Diabetes mellitus treated with oral medication (HCC) As above.  - tirzepatide (MOUNJARO) 7.5 MG/0.5ML Pen; Inject 7.5 mg into the skin once a week.  Dispense: 6 mL; Refill: 0  Long-term current use of injectable noninsulin antidiabetic medication As above.  - tirzepatide (MOUNJARO) 7.5 MG/0.5ML Pen; Inject 7.5 mg into the skin once a week.  Dispense: 6 mL; Refill: 0  Hyperlipidemia associated with type 2 diabetes mellitus  (HCC) Labs as below. Will communicate results to patient once available. Will await results to determine next steps.  Not fasting  Referral as below.  -     Lipid panel -     AMB Referral to Pharmacy Medication Management  Morbid obesity (HCC) Encouraged patient with diet and lifestyle modifications.  Referral as below.  -     AMB Referral to Pharmacy Medication Management  Thrombocytopenia (HCC) Labs as below. Will communicate results to patient once available. Will await results to determine next steps.  Referral as below.  -     CBC with Differential/Platelet -     Ambulatory referral to Hematology / Oncology  Continue all other maintenance medications.  Follow up plan: Return in about 4 weeks (around 07/10/2023) for Chronic Condition  Follow up.   Plan at follow up to increase SGLT2 dose if tolerating well and not having hypoglycemic events. Can increase glipizide as well at follow up.   Continue healthy lifestyle choices, including diet (rich in fruits, vegetables, and lean proteins, and low in salt and simple carbohydrates) and exercise (at least 30 minutes of moderate physical activity daily).  Written and verbal instructions provided   The above assessment and management plan was discussed with the patient. The patient verbalized understanding of and has agreed to the management plan. Patient is aware to call the clinic if they develop any new symptoms or if symptoms persist or worsen. Patient is aware when to return to the clinic for a follow-up visit. Patient educated on when it is appropriate to go to the emergency department.   Neale Burly, DNP-FNP [facility] [address] [place], [place] [address] [phone]

## 2023-06-12 NOTE — Patient Instructions (Signed)
Bring BP and BG log to follow up.

## 2023-06-13 ENCOUNTER — Telehealth: Payer: Self-pay

## 2023-06-13 LAB — CMP14+EGFR
ALT: 25 IU/L (ref 0–44)
AST: 25 IU/L (ref 0–40)
Albumin: 4 g/dL — ABNORMAL LOW (ref 4.1–5.1)
Alkaline Phosphatase: 125 IU/L — ABNORMAL HIGH (ref 44–121)
BUN/Creatinine Ratio: 18 (ref 9–20)
BUN: 11 mg/dL (ref 6–24)
Bilirubin Total: 2.2 mg/dL — ABNORMAL HIGH (ref 0.0–1.2)
CO2: 22 mmol/L (ref 20–29)
Calcium: 9.2 mg/dL (ref 8.7–10.2)
Chloride: 102 mmol/L (ref 96–106)
Creatinine, Ser: 0.6 mg/dL — ABNORMAL LOW (ref 0.76–1.27)
Globulin, Total: 2.7 g/dL (ref 1.5–4.5)
Glucose: 349 mg/dL — ABNORMAL HIGH (ref 70–99)
Potassium: 4.5 mmol/L (ref 3.5–5.2)
Sodium: 140 mmol/L (ref 134–144)
Total Protein: 6.7 g/dL (ref 6.0–8.5)
eGFR: 123 mL/min/{1.73_m2} (ref >59)

## 2023-06-13 LAB — LIPID PANEL
Chol/HDL Ratio: 4.9 ratio (ref 0.0–5.0)
Cholesterol, Total: 163 mg/dL (ref 100–199)
HDL: 33 mg/dL — ABNORMAL LOW (ref >39)
LDL Chol Calc (NIH): 104 mg/dL — ABNORMAL HIGH (ref 0–99)
Triglycerides: 144 mg/dL (ref 0–149)
VLDL Cholesterol Cal: 26 mg/dL (ref 5–40)

## 2023-06-13 LAB — CBC WITH DIFFERENTIAL/PLATELET
Basophils Absolute: 0 10*3/uL (ref 0.0–0.2)
Basos: 0 %
EOS (ABSOLUTE): 0.1 10*3/uL (ref 0.0–0.4)
Eos: 1 %
Hematocrit: 48.1 % (ref 37.5–51.0)
Hemoglobin: 15.6 g/dL (ref 13.0–17.7)
Immature Grans (Abs): 0 10*3/uL (ref 0.0–0.1)
Immature Granulocytes: 0 %
Lymphocytes Absolute: 1.7 10*3/uL (ref 0.7–3.1)
Lymphs: 33 %
MCH: 29 pg (ref 26.6–33.0)
MCHC: 32.4 g/dL (ref 31.5–35.7)
MCV: 89 fL (ref 79–97)
Monocytes Absolute: 0.3 10*3/uL (ref 0.1–0.9)
Monocytes: 6 %
Neutrophils Absolute: 3 10*3/uL (ref 1.4–7.0)
Neutrophils: 60 %
Platelets: 121 10*3/uL — ABNORMAL LOW (ref 150–450)
RBC: 5.38 x10E6/uL (ref 4.14–5.80)
RDW: 13.1 % (ref 11.6–15.4)
WBC: 5.1 10*3/uL (ref 3.4–10.8)

## 2023-06-13 LAB — VITAMIN D 25 HYDROXY (VIT D DEFICIENCY, FRACTURES): Vit D, 25-Hydroxy: 8.7 ng/mL — ABNORMAL LOW (ref 30.0–100.0)

## 2023-06-13 MED ORDER — VITAMIN D (ERGOCALCIFEROL) 1.25 MG (50000 UNIT) PO CAPS: 50000.0000 [IU] | ORAL_CAPSULE | ORAL | 0 refills | Status: DC

## 2023-06-13 NOTE — Progress Notes (Signed)
Elevated BG, consistent with A1C. Switched from ozempic to [employer]. Abnormalities with albumin, bilirubin, alk phos suggestive of continued liver disease. Would like patient to complete isoenzyme and indirect bilirubin labs. Platelets remain low. Referral placed to hematology. Vitamin D level is low. I have sent in a weekly supplement to take for the next 12 weeks. After that, take a daily OTC vitamin D supplement with 1000-2000 IU. Cholesterol improving. HDL "good" cholesterol remains low. Recommend taking in healthy fats, such as nuts, fish, avocados.

## 2023-06-13 NOTE — Progress Notes (Unsigned)
   Care Guide Note  06/13/2023 Name: JAROM ROSENSTOCK MRN: [id number] DOB: 1981/08/10  Referred by: Arrie Senate, FNP Reason for referral : Care Coordination (Outreach to schedule with Pharm d )   KIAAN HINELY is a 42 y.o. year old male who is a primary care patient of Ellamae Sia, Aleen Campi, FNP. NUNO VANGENDEREN was referred to the pharmacist for assistance related to DM.    An unsuccessful telephone outreach was attempted today to contact the patient who was referred to the pharmacy team for assistance with medication management. Additional attempts will be made to contact the patient.   Penne Lash, RMA Care Guide [facility]  [place], [place] [address] Direct Dial: [phone] [email or web address]

## 2023-06-13 NOTE — Addendum Note (Signed)
Addended by: Neale Burly on: 06/13/2023 12:42 PM   Modules accepted: Orders

## 2023-06-16 NOTE — Progress Notes (Signed)
   Care Guide Note  06/16/2023 Name: DEVAANSH DOWSETT MRN: [id number] DOB: [birth year]  Referred by: Arrie Senate, FNP Reason for referral : Care Coordination (Outreach to schedule with Pharm d )   Jack Porter is a 42 y.o. year old male who is a primary care patient of Ellamae Sia, Aleen Campi, FNP. DELANO ARTUSO was referred to the pharmacist for assistance related to DM.    Successful contact was made with the patient to discuss pharmacy services including being ready for the pharmacist to call at least 5 minutes before the scheduled appointment time, to have medication bottles and any blood sugar or blood pressure readings ready for review. The patient agreed to meet with the pharmacist via with the pharmacist via telephone visit on (date/time).  07/10/2023  Penne Lash, RMA Care Guide [facility]  [place], [place] [address] Direct Dial: [phone] [email or web address]

## 2023-06-30 ENCOUNTER — Ambulatory Visit: Payer: BC Managed Care – PPO

## 2023-06-30 DIAGNOSIS — E119 Type 2 diabetes mellitus without complications: Secondary | ICD-10-CM | POA: Diagnosis not present

## 2023-06-30 LAB — HM DIABETES EYE EXAM

## 2023-06-30 NOTE — Progress Notes (Signed)
Jack Porter arrived 06/30/2023 and has given verbal consent to obtain images and complete their overdue diabetic retinal screening.  The images have been sent to an ophthalmologist or optometrist for review and interpretation.  Results will be sent back to Arrie Senate, FNP for review.  Patient has been informed they will be contacted when we receive the results via telephone or MyChart

## 2023-07-09 NOTE — Progress Notes (Deleted)
   07/09/2023 Name: Jack Porter MRN: [id number] DOB: 1981/03/22  No chief complaint on file.   Jack Porter is a 42 y.o. year old male who was referred for medication management by their primary care provider, Ellamae Sia, Aleen Campi, FNP. They presented for a face to face visit today.   They were referred to the pharmacist by their PCP for assistance in managing diabetes. He is a new start [place].    Subjective:  Care Team: Primary Care Provider: Arrie Senate, FNP ; Next Scheduled Visit: *** {careteamprovider:27366}  Medication Access/Adherence  Current Pharmacy:  [facility] [address], [place] - [address] [place] [address] Phone: [phone] Fax: [phone]   Patient reports affordability concerns with their medications: {YES/NO:21197} Patient reports access/transportation concerns to their pharmacy: {YES/NO:21197} Patient reports adherence concerns with their medications:  {YES/NO:21197} ***   Diabetes:  Current medications:  Metformin 1000 mg twice daily  Farxiga 5 mg daily  Glipizide 5 mg daily  Mounjaro 7.5 mg once weekly (has he started??)  Medications tried in the past:  Glyburide-Metformin 5-500 mg daily  Ozempic 2 mg weekly   Current glucose readings: *** Using Relion? meter; testing *** times daily  @CGMDOWNLOAD @  Patient {Actions; denies-reports:120008} hypoglycemic s/sx including ***dizziness, shakiness, sweating. Patient {Actions; denies-reports:120008} hyperglycemic symptoms including ***polyuria, polydipsia, polyphagia, nocturia, neuropathy, blurred vision.  Current meal patterns:  - Breakfast: *** - Lunch *** - Supper *** - Snacks *** - Drinks ***  Current physical activity: ***  Current medication access support: ***   Objective:  Lab Results  Component Value Date   HGBA1C 10.4 (H) 06/12/2023    Lab Results  Component Value Date   CREATININE 0.60 (L) 06/12/2023    BUN 11 06/12/2023   NA 140 06/12/2023   K 4.5 06/12/2023   CL 102 06/12/2023   CO2 22 06/12/2023    Lab Results  Component Value Date   CHOL 163 06/12/2023   HDL 33 (L) 06/12/2023   LDLCALC 104 (H) 06/12/2023   TRIG 144 06/12/2023   CHOLHDL 4.9 06/12/2023    Medications Reviewed Today   Medications were not reviewed in this encounter       Assessment/Plan:   Diabetes: - Currently uncontrolled. A1c 10.4 (goal <7), increased from June 2024 (9.7).  - Reviewed long term cardiovascular and renal outcomes of uncontrolled blood sugar - Reviewed goal A1c, goal fasting, and goal 2 hour post prandial glucose - Reviewed dietary modifications including *** - Reviewed lifestyle modifications including: - Recommend to discontinue glipizide, increase Farxiga to 10 mg daily, and initiate basal insulin (Lantus Solostar 10 unit at bedtime)  - Recommend to check glucose *** - Meets financial criteria for *** patient assistance program through ***. Will collaborate with provider, CPhT, and patient to pursue assistance.     Follow Up Plan: ***  Sofie Rower, PharmD [facility] PGY1

## 2023-07-10 ENCOUNTER — Encounter: Payer: Self-pay | Admitting: Pharmacist

## 2023-07-10 ENCOUNTER — Encounter: Payer: Self-pay | Admitting: Family Medicine

## 2023-07-10 ENCOUNTER — Telehealth: Payer: BC Managed Care – PPO | Admitting: Family Medicine

## 2023-07-10 ENCOUNTER — Ambulatory Visit: Payer: BC Managed Care – PPO

## 2023-07-10 DIAGNOSIS — E1169 Type 2 diabetes mellitus with other specified complication: Secondary | ICD-10-CM | POA: Diagnosis not present

## 2023-07-10 DIAGNOSIS — E781 Pure hyperglyceridemia: Secondary | ICD-10-CM | POA: Diagnosis not present

## 2023-07-10 DIAGNOSIS — Z7985 Long-term (current) use of injectable non-insulin antidiabetic drugs: Secondary | ICD-10-CM | POA: Diagnosis not present

## 2023-07-10 DIAGNOSIS — Z7984 Long term (current) use of oral hypoglycemic drugs: Secondary | ICD-10-CM

## 2023-07-10 DIAGNOSIS — E119 Type 2 diabetes mellitus without complications: Secondary | ICD-10-CM | POA: Insufficient documentation

## 2023-07-10 MED ORDER — TIRZEPATIDE 12.5 MG/0.5ML ~~LOC~~ SOAJ: 12.5000 mg | SUBCUTANEOUS | 0 refills | Status: DC

## 2023-07-10 MED ORDER — TIRZEPATIDE 10 MG/0.5ML ~~LOC~~ SOAJ
10.0000 mg | SUBCUTANEOUS | 0 refills | Status: DC
Start: 2023-07-10 — End: 2023-09-23

## 2023-07-10 MED ORDER — DAPAGLIFLOZIN PROPANEDIOL 10 MG PO TABS
10.0000 mg | ORAL_TABLET | Freq: Every day | ORAL | 1 refills | Status: DC
Start: 2023-07-10 — End: 2023-09-23

## 2023-07-10 NOTE — Progress Notes (Signed)
Virtual Visit via Video Note  I connected with Jack Porter on 07/10/23 at  3:50 PM EDT by a video enabled telemedicine application and verified that I am speaking with the correct person using two identifiers.  Patient Location: Other:  work  Dispensing optician: Office/Clinic  I discussed the limitations, risks, security, and privacy concerns of performing an evaluation and management service by video and the availability of in person appointments. I also discussed with the patient that there may be a patient responsible charge related to this service. The patient expressed understanding and agreed to proceed.  Subjective: PCP: Arrie Senate, FNP  Chief Complaint  Patient presents with   Diabetes    Type 2 Diabetes with HLD Patient presents today via telehealth for follow up of Diabetes.  Patient was recently switched from glucovance to optimize metformin. He is tolerating 1000 mg Metformin BID.  Reports that he is tolerating farxiga and mounjaro. He was switched from ozempic to mounjaro at last appt due to insurance coverage.  Reports that he is monitoring his BG at home.  Averages 124-164.  Lowest BG = 100   Last eye exam: due Last foot exam: completed 06/12/2023 Last A1c:  Lab Results  Component Value Date   HGBA1C 10.4 (H) 06/12/2023   Nephropathy screen indicated?: due 01/31/24 Last flu, zoster and/or pneumovax:  Immunization History  Administered Date(s) Administered   MODERNA SARS COV-2 Pediatric Vaccination 6mos to <29yrs 01/06/2020, 02/03/2020   Moderna Sars-Covid-2 Vaccination 01/06/2020, 02/03/2020, 02/02/2021    ROS: Denies dizziness, LOC, polyuria, polydipsia, unintended weight loss/gain, foot ulcerations, numbness or tingling in extremities, shortness of breath or chest pain.   ROS: Per HPI  Current Outpatient Medications:    blood glucose meter kit and supplies KIT, Dispense based on patient and insurance preference. Use up to four times daily  as directed. (FOR ICD-9 250.00, 250.01)., Disp: 1 each, Rfl: 0   dapagliflozin propanediol (FARXIGA) 5 MG TABS tablet, Take 1 tablet (5 mg total) by mouth daily before breakfast., Disp: 30 tablet, Rfl: 0   glipiZIDE (GLUCOTROL) 5 MG tablet, Take 1 tablet (5 mg total) by mouth daily before breakfast., Disp: 90 tablet, Rfl: 0   glucose blood (IGLUCOSE TEST STRIPS) test strip, Use as instructed, Disp: 100 each, Rfl: 12   losartan (COZAAR) 25 MG tablet, Take 1 tablet by mouth once daily, Disp: 90 tablet, Rfl: 0   metFORMIN (GLUCOPHAGE) 1000 MG tablet, Take 1 tablet (1,000 mg total) by mouth 2 (two) times daily with a meal., Disp: 60 tablet, Rfl: 2   rosuvastatin (CRESTOR) 5 MG tablet, Take 1 tablet (5 mg total) by mouth daily., Disp: 90 tablet, Rfl: 1   tirzepatide (MOUNJARO) 7.5 MG/0.5ML Pen, Inject 7.5 mg into the skin once a week., Disp: 6 mL, Rfl: 0   Vitamin D, Ergocalciferol, (DRISDOL) 1.25 MG (50000 UNIT) CAPS capsule, Take 1 capsule (50,000 Units total) by mouth every 7 (seven) days., Disp: 12 capsule, Rfl: 0  Observations/Objective: There were no vitals filed for this visit. Physical Exam Constitutional:      General: He is awake. He is not in acute distress.    Appearance: Normal appearance. He is well-developed and well-groomed. He is not ill-appearing, toxic-appearing or diaphoretic.  Pulmonary:     Effort: Pulmonary effort is normal.  Neurological:     General: No focal deficit present.     Mental Status: He is alert and oriented to person, place, and time.  Psychiatric:  Attention and Perception: Attention and perception normal.        Mood and Affect: Mood and affect normal.        Speech: Speech normal.        Behavior: Behavior normal. Behavior is cooperative.        Thought Content: Thought content normal.        Cognition and Memory: Cognition and memory normal.        Judgment: Judgment normal.    Assessment and Plan: 1. Type 2 diabetes mellitus with  hypertriglyceridemia (HCC) Tolerating medications well. Home BG levels within desired goal. Will step up therapy with increased doses of mounjaro and farxiga. Patient to continue metformin at optimal dose. Continue to monitor BG at home.  - tirzepatide (MOUNJARO) 10 MG/0.5ML Pen; Inject 10 mg into the skin once a week.  Dispense: 6 mL; Refill: 0 - tirzepatide (MOUNJARO) 12.5 MG/0.5ML Pen; Inject 12.5 mg into the skin once a week.  Dispense: 6 mL; Refill: 0  2. Long-term current use of injectable noninsulin antidiabetic medication As above.  - tirzepatide (MOUNJARO) 10 MG/0.5ML Pen; Inject 10 mg into the skin once a week.  Dispense: 6 mL; Refill: 0 - tirzepatide (MOUNJARO) 12.5 MG/0.5ML Pen; Inject 12.5 mg into the skin once a week.  Dispense: 6 mL; Refill: 0  3. Diabetes mellitus treated with oral medication (HCC) As above  - dapagliflozin propanediol (FARXIGA) 10 MG TABS tablet; Take 1 tablet (10 mg total) by mouth daily before breakfast.  Dispense: 30 tablet; Refill: 1  4. Type 2 diabetes mellitus without complication, without long-term current use of insulin (HCC) As above. - tirzepatide (MOUNJARO) 10 MG/0.5ML Pen; Inject 10 mg into the skin once a week.  Dispense: 6 mL; Refill: 0 - tirzepatide (MOUNJARO) 12.5 MG/0.5ML Pen; Inject 12.5 mg into the skin once a week.  Dispense: 6 mL; Refill: 0 - dapagliflozin propanediol (FARXIGA) 10 MG TABS tablet; Take 1 tablet (10 mg total) by mouth daily before breakfast.  Dispense: 30 tablet; Refill: 1   Appt time 07/10/2023 03:50 PM Call duration: 00:03:20  Follow Up Instructions: Return in about 2 months (around 09/09/2023) for diabetes.   I discussed the assessment and treatment plan with the patient. The patient was provided an opportunity to ask questions, and all were answered. The patient agreed with the plan and demonstrated an understanding of the instructions.   The patient was advised to call back or seek an in-person evaluation if the  symptoms worsen or if the condition fails to improve as anticipated.  The above assessment and management plan was discussed with the patient. The patient verbalized understanding of and has agreed to the management plan.   Neale Burly, DNP-FNP [facility] [address] [place], [place] [address] [phone]

## 2023-07-15 NOTE — Progress Notes (Signed)
Negative Retinopathy. Recommend repeat in one year.

## 2023-08-08 ENCOUNTER — Encounter: Payer: Self-pay | Admitting: *Deleted

## 2023-09-16 ENCOUNTER — Ambulatory Visit: Payer: BC Managed Care – PPO | Admitting: Family Medicine

## 2023-09-23 ENCOUNTER — Ambulatory Visit: Payer: BC Managed Care – PPO | Admitting: Family Medicine

## 2023-09-23 ENCOUNTER — Encounter: Payer: Self-pay | Admitting: Family Medicine

## 2023-09-23 VITALS — BP 116/80 | HR 100 | Temp 98.3°F | Ht 73.0 in | Wt 287.0 lb

## 2023-09-23 DIAGNOSIS — Z7985 Long-term (current) use of injectable non-insulin antidiabetic drugs: Secondary | ICD-10-CM

## 2023-09-23 DIAGNOSIS — Z7984 Long term (current) use of oral hypoglycemic drugs: Secondary | ICD-10-CM

## 2023-09-23 DIAGNOSIS — E559 Vitamin D deficiency, unspecified: Secondary | ICD-10-CM | POA: Insufficient documentation

## 2023-09-23 DIAGNOSIS — E781 Pure hyperglyceridemia: Secondary | ICD-10-CM

## 2023-09-23 DIAGNOSIS — E785 Hyperlipidemia, unspecified: Secondary | ICD-10-CM | POA: Diagnosis not present

## 2023-09-23 DIAGNOSIS — E1169 Type 2 diabetes mellitus with other specified complication: Secondary | ICD-10-CM

## 2023-09-23 DIAGNOSIS — E119 Type 2 diabetes mellitus without complications: Secondary | ICD-10-CM

## 2023-09-23 LAB — BAYER DCA HB A1C WAIVED: HB A1C (BAYER DCA - WAIVED): 8.3 % — ABNORMAL HIGH (ref 4.8–5.6)

## 2023-09-23 MED ORDER — ROSUVASTATIN CALCIUM 5 MG PO TABS: 5.0000 mg | ORAL_TABLET | Freq: Every day | ORAL | 1 refills | Status: DC

## 2023-09-23 MED ORDER — DAPAGLIFLOZIN PROPANEDIOL 10 MG PO TABS: 10.0000 mg | ORAL_TABLET | Freq: Every day | ORAL | 1 refills | Status: DC

## 2023-09-23 MED ORDER — TIRZEPATIDE 15 MG/0.5ML ~~LOC~~ SOAJ: 15.0000 mg | SUBCUTANEOUS | 2 refills | Status: DC

## 2023-09-23 MED ORDER — METFORMIN HCL 1000 MG PO TABS: 1000.0000 mg | ORAL_TABLET | Freq: Two times a day (BID) | ORAL | 2 refills | Status: DC

## 2023-09-23 NOTE — Progress Notes (Signed)
Subjective:  Patient ID: Jack Porter, male    DOB: [birth year], 42 y.o.   MRN: [id number]  Patient Care Team: Arrie Senate, FNP as PCP - General (Family Medicine) Delora Fuel, [place] (Optometry)   Chief Complaint:  Medical Management of Chronic Issues   HPI: Jack Porter is a 42 y.o. male presenting on 09/23/2023 for Medical Management of Chronic Issues  HPI 1. Type 2 diabetes mellitus with hypertriglyceridemia (HCC) Glucometer:has not been checking recently.  Snacking a lot recently due to working late hours  Taking medication(s): mounjaro, metformin, farxiga, glipizide  Last eye exam: due 06/29/24 Last foot exam: due 06/11/24 Last A1c:  Lab Results  Component Value Date   HGBA1C 8.3 (H) 09/23/2023   Nephropathy screen indicated?: 06/11/24 Last flu, zoster and/or pneumovax:  Immunization History  Administered Date(s) Administered   MODERNA SARS COV-2 Pediatric Vaccination 6mos to <89yrs 01/06/2020, 02/03/2020   Moderna Sars-Covid-2 Vaccination 01/06/2020, 02/03/2020, 02/02/2021    ROS: Denies dizziness, LOC, polyuria, polydipsia, unintended weight loss/gain, foot ulcerations, numbness or tingling in extremities, shortness of breath or chest pain.  2. Hyperlipidemia associated with type 2 diabetes mellitus (HCC) Lipid/Cholesterol, Follow-up  Last lipid panel Other pertinent labs  Lab Results  Component Value Date   CHOL 163 06/12/2023   HDL 33 (L) 06/12/2023   LDLCALC 104 (H) 06/12/2023   TRIG 144 06/12/2023   CHOLHDL 4.9 06/12/2023   Lab Results  Component Value Date   ALT 25 06/12/2023   AST 25 06/12/2023   PLT 121 (L) 06/12/2023   TSH 2.420 01/31/2023     He was last seen for this 3 months ago.  Management since that visit includes crestor.  He reports poor compliance with treatment. He is not having side effects.   Symptoms: No chest pain No chest pressure/discomfort  No dyspnea No lower extremity edema  No numbness or tingling of  extremity No orthopnea  No palpitations No paroxysmal nocturnal dyspnea  No speech difficulty No syncope   Current diet: in general, an "unhealthy" diet Current exercise: no regular exercise  The 10-year ASCVD risk score (Arnett DK, et al., 2019) is: 3.1%  ---------------------------------------------------------------------------------------------------  Vitamin D deficiency Denies any symptoms. Completed supplementation    Relevant past medical, surgical, family, and social history reviewed and updated as indicated.  Allergies and medications reviewed and updated. Data reviewed: Chart in Epic.   Past Medical History:  Diagnosis Date   GERD (gastroesophageal reflux disease)    H/O hiatal hernia    Type 2 diabetes mellitus (HCC)     Past Surgical History:  Procedure Laterality Date   CHOLECYSTECTOMY N/A 01/21/2014   Procedure: LAPAROSCOPIC CHOLECYSTECTOMY;  Surgeon: Atilano Ina, MD;  Location: [facility];  Service: General;  Laterality: N/A;    Social History   Socioeconomic History   Marital status: Married    Spouse name: Not on file   Number of children: Not on file   Years of education: Not on file   Highest education level: 12th grade  Occupational History   Not on file  Tobacco Use   Smoking status: Former    Current packs/day: 0.25    Average packs/day: 0.3 packs/day for 3.0 years (0.8 ttl pk-yrs)    Types: Cigarettes   Smokeless tobacco: Never  Vaping Use   Vaping status: Never Used  Substance and Sexual Activity   Alcohol use: No    Comment: rarely   Drug use: No   Sexual activity: Yes  Other Topics Concern   Not on file  Social History Narrative   Not on file   Social Drivers of Health   Financial Resource Strain: Low Risk  (09/22/2023)   Overall Financial Resource Strain (CARDIA)    Difficulty of Paying Living Expenses: Not very hard  Food Insecurity: No Food Insecurity (09/22/2023)   Hunger Vital Sign    Worried About Running Out of Food in  the Last Year: Never true    Ran Out of Food in the Last Year: Never true  Transportation Needs: No Transportation Needs (09/22/2023)   PRAPARE - Administrator, Civil Service (Medical): No    Lack of Transportation (Non-Medical): No  Physical Activity: Insufficiently Active (09/22/2023)   Exercise Vital Sign    Days of Exercise per Week: 3 days    Minutes of Exercise per Session: 20 min  Stress: No Stress Concern Present (09/22/2023)   [employer] of Occupational Health - Occupational Stress Questionnaire    Feeling of Stress : Not at all  Social Connections: Moderately Integrated (09/22/2023)   Social Connection and Isolation Panel [NHANES]    Frequency of Communication with Friends and Family: Once a week    Frequency of Social Gatherings with Friends and Family: Three times a week    Attends Religious Services: 1 to 4 times per year    Active Member of Clubs or Organizations: No    Attends Banker Meetings: Not on file    Marital Status: Married  Intimate Partner Violence: Not on file    Outpatient Encounter Medications as of 09/23/2023  Medication Sig   blood glucose meter kit and supplies KIT Dispense based on patient and insurance preference. Use up to four times daily as directed. (FOR ICD-9 250.00, 250.01).   dapagliflozin propanediol (FARXIGA) 10 MG TABS tablet Take 1 tablet (10 mg total) by mouth daily before breakfast.   glucose blood (IGLUCOSE TEST STRIPS) test strip Use as instructed   losartan (COZAAR) 25 MG tablet Take 1 tablet by mouth once daily   rosuvastatin (CRESTOR) 5 MG tablet Take 1 tablet (5 mg total) by mouth daily.   tirzepatide (MOUNJARO) 10 MG/0.5ML Pen Inject 10 mg into the skin once a week.   tirzepatide (MOUNJARO) 12.5 MG/0.5ML Pen Inject 12.5 mg into the skin once a week.   Vitamin D, Ergocalciferol, (DRISDOL) 1.25 MG (50000 UNIT) CAPS capsule Take 1 capsule (50,000 Units total) by mouth every 7 (seven) days.   glipiZIDE  (GLUCOTROL) 5 MG tablet Take 1 tablet (5 mg total) by mouth daily before breakfast.   metFORMIN (GLUCOPHAGE) 1000 MG tablet Take 1 tablet (1,000 mg total) by mouth 2 (two) times daily with a meal.   No facility-administered encounter medications on file as of 09/23/2023.   No Known Allergies  Review of Systems As per HPI  Objective:  BP 116/80   Pulse 100   Temp 98.3 F (36.8 C)   Ht 6\' 1"  (1.854 m)   Wt 287 lb (130.2 kg)   SpO2 97%   BMI 37.87 kg/m    Wt Readings from Last 3 Encounters:  09/23/23 287 lb (130.2 kg)  06/12/23 299 lb (135.6 kg)  03/12/23 299 lb 6.4 oz (135.8 kg)   Physical Exam Constitutional:      General: He is awake. He is not in acute distress.    Appearance: Normal appearance. He is well-developed and well-groomed. He is obese. He is not ill-appearing, toxic-appearing or diaphoretic.  Cardiovascular:  Rate and Rhythm: Normal rate and regular rhythm.     Pulses: Normal pulses.          Radial pulses are 2+ on the right side and 2+ on the left side.       Posterior tibial pulses are 2+ on the right side and 2+ on the left side.     Heart sounds: Normal heart sounds. No murmur heard.    No gallop.  Pulmonary:     Effort: Pulmonary effort is normal. No respiratory distress.     Breath sounds: Decreased air movement present. No stridor. Examination of the right-upper field reveals decreased breath sounds. Examination of the left-upper field reveals decreased breath sounds. Examination of the right-middle field reveals decreased breath sounds. Examination of the left-middle field reveals decreased breath sounds. Examination of the right-lower field reveals decreased breath sounds. Examination of the left-lower field reveals decreased breath sounds. Decreased breath sounds present. No wheezing, rhonchi or rales.  Musculoskeletal:     Cervical back: Full passive range of motion without pain and neck supple.     Right lower leg: No edema.     Left lower leg: No  edema.  Skin:    General: Skin is warm.     Capillary Refill: Capillary refill takes less than 2 seconds.  Neurological:     General: No focal deficit present.     Mental Status: He is alert, oriented to person, place, and time and easily aroused. Mental status is at baseline.     GCS: GCS eye subscore is 4. GCS verbal subscore is 5. GCS motor subscore is 6.     Motor: No weakness.  Psychiatric:        Attention and Perception: Attention and perception normal.        Mood and Affect: Mood and affect normal.        Speech: Speech normal.        Behavior: Behavior normal. Behavior is cooperative.        Thought Content: Thought content normal. Thought content does not include homicidal or suicidal ideation. Thought content does not include homicidal or suicidal plan.        Cognition and Memory: Cognition and memory normal.        Judgment: Judgment normal.    Results for orders placed or performed in visit on 07/15/23  HM DIABETES EYE EXAM   Collection Time: 06/30/23 12:00 AM  Result Value Ref Range   HM Diabetic Eye Exam No Retinopathy No Retinopathy       09/23/2023    3:40 PM 06/12/2023    3:54 PM 03/12/2023    3:17 PM 03/12/2023    3:14 PM 01/30/2023    3:10 PM  Depression screen PHQ 2/9  Decreased Interest 0 0 0 0 0  Down, Depressed, Hopeless 0 0 0 0 0  PHQ - 2 Score 0 0 0 0 0  Altered sleeping 0 0  0 0  Tired, decreased energy 0 0  0 0  Change in appetite 0 0  0 0  Feeling bad or failure about yourself  0 0  0 0  Trouble concentrating 0 0  0 0  Moving slowly or fidgety/restless 0 0  0 0  Suicidal thoughts 0 0  0 0  PHQ-9 Score 0 0  0 0  Difficult doing work/chores Not difficult at all Not difficult at all  Not difficult at all Not difficult at all  09/23/2023    3:40 PM 06/12/2023    4:04 PM 03/12/2023    3:14 PM 01/30/2023    3:11 PM  GAD 7 : Generalized Anxiety Score  Nervous, Anxious, on Edge 0 0 0 0  Control/stop worrying 0 0 0 0  Worry too much - different  things 0 0 0 0  Trouble relaxing 0 0 0 0  Restless 0 0 0 0  Easily annoyed or irritable 0 0 0 0  Afraid - awful might happen 0 0 0 0  Total GAD 7 Score 0 0 0 0  Anxiety Difficulty Not difficult at all Not difficult at all Not difficult at all Not difficult at all   Pertinent labs & imaging results that were available during my care of the patient were reviewed by me and considered in my medical decision making.  Assessment & Plan:  Jack Porter was seen today for medical management of chronic issues.  Diagnoses and all orders for this visit:  Type 2 diabetes mellitus with hypertriglyceridemia (HCC) Not at goal, but much improved A1C. Will increase mounjaro as below. Labs as below. Will communicate results to patient once available. Will await results to determine next steps.  Patient to continue diet and lifestyle modifications. Praised patient on weight loss.  -     Cancel: Bayer DCA Hb A1c Waived -     VITAMIN D 25 Hydroxy (Vit-D Deficiency, Fractures) -     CBC with Differential/Platelet -     CMP14+EGFR -     Bayer DCA Hb A1c Waived -     Vitamin B12 -     tirzepatide (MOUNJARO) 15 MG/0.5ML Pen; Inject 15 mg into the skin once a week.  Hyperlipidemia associated with type 2 diabetes mellitus (HCC) Will refill medication as below. Reviewed previous ASCVD risk score with patient. Labs as below. Will communicate results to patient once available. Will await results to determine next steps.  Not fasting  -     Lipid panel -     rosuvastatin (CRESTOR) 5 MG tablet; Take 1 tablet (5 mg total) by mouth daily.  Type 2 diabetes mellitus without complication, without long-term current use of insulin (HCC) As above. Patient to continue medications as below.  -     dapagliflozin propanediol (FARXIGA) 10 MG TABS tablet; Take 1 tablet (10 mg total) by mouth daily before breakfast. -     metFORMIN (GLUCOPHAGE) 1000 MG tablet; Take 1 tablet (1,000 mg total) by mouth 2 (two) times daily with a  meal.  Diabetes mellitus treated with oral medication (HCC) As above.  -     dapagliflozin propanediol (FARXIGA) 10 MG TABS tablet; Take 1 tablet (10 mg total) by mouth daily before breakfast. -     metFORMIN (GLUCOPHAGE) 1000 MG tablet; Take 1 tablet (1,000 mg total) by mouth 2 (two) times daily with a meal.  Long-term current use of injectable noninsulin antidiabetic medication As above.  -     tirzepatide (MOUNJARO) 15 MG/0.5ML Pen; Inject 15 mg into the skin once a week.  Vitamin D deficiency Completed supplementation. Labs as below. Will communicate results to patient once available. Will await results to determine next steps.  -     VITAMIN D 25 Hydroxy (Vit-D Deficiency, Fractures)  Continue all other maintenance medications.  Follow up plan: Return in about 3 months (around 12/22/2023) for Chronic Condition Follow up.   Continue healthy lifestyle choices, including diet (rich in fruits, vegetables, and lean proteins, and low in salt  and simple carbohydrates) and exercise (at least 30 minutes of moderate physical activity daily).  Written and verbal instructions provided   The above assessment and management plan was discussed with the patient. The patient verbalized understanding of and has agreed to the management plan. Patient is aware to call the clinic if they develop any new symptoms or if symptoms persist or worsen. Patient is aware when to return to the clinic for a follow-up visit. Patient educated on when it is appropriate to go to the emergency department.   Neale Burly, DNP-FNP [facility] [address] [place], [place] [address] [phone]

## 2023-09-24 LAB — CBC WITH DIFFERENTIAL/PLATELET
Basophils Absolute: 0 10*3/uL (ref 0.0–0.2)
Basos: 1 %
EOS (ABSOLUTE): 0.2 10*3/uL (ref 0.0–0.4)
Eos: 3 %
Hematocrit: 51.8 % — ABNORMAL HIGH (ref 37.5–51.0)
Hemoglobin: 17.4 g/dL (ref 13.0–17.7)
Immature Grans (Abs): 0 10*3/uL (ref 0.0–0.1)
Immature Granulocytes: 0 %
Lymphocytes Absolute: 2.4 10*3/uL (ref 0.7–3.1)
Lymphs: 41 %
MCH: 29.5 pg (ref 26.6–33.0)
MCHC: 33.6 g/dL (ref 31.5–35.7)
MCV: 88 fL (ref 79–97)
Monocytes Absolute: 0.5 10*3/uL (ref 0.1–0.9)
Monocytes: 8 %
Neutrophils Absolute: 2.7 10*3/uL (ref 1.4–7.0)
Neutrophils: 47 %
Platelets: 167 10*3/uL (ref 150–450)
RBC: 5.9 x10E6/uL — ABNORMAL HIGH (ref 4.14–5.80)
RDW: 13.6 % (ref 11.6–15.4)
WBC: 5.8 10*3/uL (ref 3.4–10.8)

## 2023-09-24 LAB — CMP14+EGFR
ALT: 32 [IU]/L (ref 0–44)
AST: 33 [IU]/L (ref 0–40)
Albumin: 4.3 g/dL (ref 4.1–5.1)
Alkaline Phosphatase: 116 [IU]/L (ref 44–121)
BUN/Creatinine Ratio: 16 (ref 9–20)
BUN: 10 mg/dL (ref 6–24)
Bilirubin Total: 1.9 mg/dL — ABNORMAL HIGH (ref 0.0–1.2)
CO2: 20 mmol/L (ref 20–29)
Calcium: 10.1 mg/dL (ref 8.7–10.2)
Chloride: 105 mmol/L (ref 96–106)
Creatinine, Ser: 0.63 mg/dL — ABNORMAL LOW (ref 0.76–1.27)
Globulin, Total: 3.1 g/dL (ref 1.5–4.5)
Glucose: 155 mg/dL — ABNORMAL HIGH (ref 70–99)
Potassium: 4.3 mmol/L (ref 3.5–5.2)
Sodium: 144 mmol/L (ref 134–144)
Total Protein: 7.4 g/dL (ref 6.0–8.5)
eGFR: 121 mL/min/{1.73_m2} (ref >59)

## 2023-09-24 LAB — LIPID PANEL
Chol/HDL Ratio: 5.8 {ratio} — ABNORMAL HIGH (ref 0.0–5.0)
Cholesterol, Total: 203 mg/dL — ABNORMAL HIGH (ref 100–199)
HDL: 35 mg/dL — ABNORMAL LOW (ref >39)
LDL Chol Calc (NIH): 124 mg/dL — ABNORMAL HIGH (ref 0–99)
Triglycerides: 248 mg/dL — ABNORMAL HIGH (ref 0–149)
VLDL Cholesterol Cal: 44 mg/dL — ABNORMAL HIGH (ref 5–40)

## 2023-09-24 LAB — VITAMIN D 25 HYDROXY (VIT D DEFICIENCY, FRACTURES): Vit D, 25-Hydroxy: 17.4 ng/mL — ABNORMAL LOW (ref 30.0–100.0)

## 2023-09-24 LAB — VITAMIN B12: Vitamin B-12: 339 pg/mL (ref 232–1245)

## 2023-09-24 MED ORDER — VITAMIN D (ERGOCALCIFEROL) 1.25 MG (50000 UNIT) PO CAPS: 50000.0000 [IU] | ORAL_CAPSULE | ORAL | 0 refills | Status: DC

## 2023-09-24 NOTE — Addendum Note (Signed)
Addended by: Neale Burly on: 09/24/2023 09:13 PM   Modules accepted: Orders

## 2023-09-24 NOTE — Progress Notes (Signed)
Vitamin D level remains low. I have sent in a weekly supplement to take for the next 12 weeks. After that, take a daily OTC vitamin D supplement with 1000-2000 IU. Mild variations on CBC, not concerning at this time. Indirect bilirubin previously ordered but not collected. Will collect at next labs.  Cholesterol elevated. Recommend diet and lifestyle modifications. Recommend that patient continue crestor. The 10-year ASCVD risk score (Arnett DK, et al., 2019) is: 4.3%.  A1C much improved!

## 2023-11-20 ENCOUNTER — Encounter: Payer: Self-pay | Admitting: Family Medicine

## 2023-11-20 ENCOUNTER — Ambulatory Visit: Payer: BC Managed Care – PPO | Admitting: Family Medicine

## 2023-11-20 VITALS — BP 120/77 | HR 95 | Temp 98.7°F | Ht 73.0 in | Wt 291.0 lb

## 2023-11-20 DIAGNOSIS — Z7984 Long term (current) use of oral hypoglycemic drugs: Secondary | ICD-10-CM

## 2023-11-20 DIAGNOSIS — R051 Acute cough: Secondary | ICD-10-CM | POA: Diagnosis not present

## 2023-11-20 DIAGNOSIS — E1169 Type 2 diabetes mellitus with other specified complication: Secondary | ICD-10-CM | POA: Diagnosis not present

## 2023-11-20 DIAGNOSIS — E781 Pure hyperglyceridemia: Secondary | ICD-10-CM

## 2023-11-20 LAB — VERITOR FLU A/B WAIVED
Influenza A: NEGATIVE
Influenza B: NEGATIVE

## 2023-11-20 LAB — RSV AG, IMMUNOCHR, WAIVED: RSV Ag, Immunochr, Waived: NEGATIVE

## 2023-11-20 NOTE — Progress Notes (Signed)
Negative for all. Called and discussed with patient

## 2023-11-20 NOTE — Progress Notes (Signed)
Subjective:  Patient ID: Jack Porter, male    DOB: Jul 20, 1981, 43 y.o.   MRN: [id number]  Patient Care Team: Arrie Senate, FNP as PCP - General (Family Medicine) Delora Fuel, OD (Optometry)   Chief Complaint:  Fatigue (Weak, tired X 4 days/Started with fever, currently no fever./)   HPI: Jack Porter is a 43 y.o. male presenting on 11/20/2023 for Fatigue (Weak, tired X 4 days/Started with fever, currently no fever./)   HPI States that he has body aches, fatigue, chills and hot flashes, fever that went away. Tmax at home 101. Taking alka seltzer cold and flu. Endorses cough. Denies congestion, sore throat. Endorses N/V on Wednesday. States that his BG is doing well, averaging 107-115 at home. Has cut out sodas. Drinking a lot of green teas and peppermint. Has been avoiding fast foods. Eating more soups.   Relevant past medical, surgical, family, and social history reviewed and updated as indicated.  Allergies and medications reviewed and updated. Data reviewed: Chart in Epic.   Past Medical History:  Diagnosis Date   GERD (gastroesophageal reflux disease)    H/O hiatal hernia    Type 2 diabetes mellitus (HCC)     Past Surgical History:  Procedure Laterality Date   CHOLECYSTECTOMY N/A 01/21/2014   Procedure: LAPAROSCOPIC CHOLECYSTECTOMY;  Surgeon: Atilano Ina, MD;  Location: [facility];  Service: General;  Laterality: N/A;    Social History   Socioeconomic History   Marital status: Married    Spouse name: Not on file   Number of children: Not on file   Years of education: Not on file   Highest education level: 12th grade  Occupational History   Not on file  Tobacco Use   Smoking status: Former    Current packs/day: 0.25    Average packs/day: 0.3 packs/day for 3.0 years (0.8 ttl pk-yrs)    Types: Cigarettes   Smokeless tobacco: Never  Vaping Use   Vaping status: Never Used  Substance and Sexual Activity   Alcohol use: No    Comment: rarely    Drug use: No   Sexual activity: Yes  Other Topics Concern   Not on file  Social History Narrative   Not on file   Social Drivers of Health   Financial Resource Strain: Low Risk  (09/22/2023)   Overall Financial Resource Strain (CARDIA)    Difficulty of Paying Living Expenses: Not very hard  Food Insecurity: No Food Insecurity (09/22/2023)   Hunger Vital Sign    Worried About Running Out of Food in the Last Year: Never true    Ran Out of Food in the Last Year: Never true  Transportation Needs: No Transportation Needs (09/22/2023)   PRAPARE - Administrator, Civil Service (Medical): No    Lack of Transportation (Non-Medical): No  Physical Activity: Insufficiently Active (09/22/2023)   Exercise Vital Sign    Days of Exercise per Week: 3 days    Minutes of Exercise per Session: 20 min  Stress: No Stress Concern Present (09/22/2023)   [employer] of Occupational Health - Occupational Stress Questionnaire    Feeling of Stress : Not at all  Social Connections: Moderately Integrated (09/22/2023)   Social Connection and Isolation Panel [NHANES]    Frequency of Communication with Friends and Family: Once a week    Frequency of Social Gatherings with Friends and Family: Three times a week    Attends Religious Services: 1 to 4 times per year  Active Member of Clubs or Organizations: No    Attends Banker Meetings: Not on file    Marital Status: Married  Intimate Partner Violence: Not on file    Outpatient Encounter Medications as of 11/20/2023  Medication Sig   blood glucose meter kit and supplies KIT Dispense based on patient and insurance preference. Use up to four times daily as directed. (FOR ICD-9 250.00, 250.01).   dapagliflozin propanediol (FARXIGA) 10 MG TABS tablet Take 1 tablet (10 mg total) by mouth daily before breakfast.   glucose blood (IGLUCOSE TEST STRIPS) test strip Use as instructed   losartan (COZAAR) 25 MG tablet Take 1 tablet by mouth  once daily   metFORMIN (GLUCOPHAGE) 1000 MG tablet Take 1 tablet (1,000 mg total) by mouth 2 (two) times daily with a meal.   rosuvastatin (CRESTOR) 5 MG tablet Take 1 tablet (5 mg total) by mouth daily.   tirzepatide (MOUNJARO) 15 MG/0.5ML Pen Inject 15 mg into the skin once a week.   Vitamin D, Ergocalciferol, (DRISDOL) 1.25 MG (50000 UNIT) CAPS capsule Take 1 capsule (50,000 Units total) by mouth every 7 (seven) days.   glipiZIDE (GLUCOTROL) 5 MG tablet Take 1 tablet (5 mg total) by mouth daily before breakfast.   No facility-administered encounter medications on file as of 11/20/2023.    No Known Allergies  Review of Systems As per hPI  Objective:  BP 120/77   Pulse 95   Temp 98.7 F (37.1 C)   Ht 6\' 1"  (1.854 m)   Wt 291 lb (132 kg)   SpO2 97%   BMI 38.39 kg/m    Wt Readings from Last 3 Encounters:  11/20/23 291 lb (132 kg)  09/23/23 287 lb (130.2 kg)  06/12/23 299 lb (135.6 kg)    Physical Exam Constitutional:      General: He is awake. He is not in acute distress.    Appearance: Normal appearance. He is well-developed and well-groomed. He is morbidly obese. He is not ill-appearing, toxic-appearing or diaphoretic.  HENT:     Right Ear: A middle ear effusion is present. There is no impacted cerumen. No foreign body. No mastoid tenderness. No PE tube. No hemotympanum. Tympanic membrane is not injected, scarred, perforated, erythematous, retracted or bulging.     Left Ear: A middle ear effusion is present. There is no impacted cerumen. No foreign body. No mastoid tenderness. No PE tube. No hemotympanum. Tympanic membrane is not injected, scarred, perforated, erythematous, retracted or bulging.     Nose: Rhinorrhea present. Rhinorrhea is clear.     Right Sinus: No maxillary sinus tenderness or frontal sinus tenderness.     Left Sinus: No maxillary sinus tenderness or frontal sinus tenderness.     Mouth/Throat:     Lips: Pink. No lesions.     Mouth: Mucous membranes are  moist.     Tongue: No lesions.     Palate: No mass.     Pharynx: Postnasal drip present. No pharyngeal swelling, oropharyngeal exudate, posterior oropharyngeal erythema or uvula swelling.     Tonsils: No tonsillar exudate or tonsillar abscesses.  Cardiovascular:     Rate and Rhythm: Normal rate and regular rhythm.     Pulses: Normal pulses.          Radial pulses are 2+ on the right side and 2+ on the left side.       Posterior tibial pulses are 2+ on the right side and 2+ on the left side.  Heart sounds: Normal heart sounds. No murmur heard.    No gallop.  Pulmonary:     Effort: Pulmonary effort is normal. No respiratory distress.     Breath sounds: Decreased air movement present. No stridor. Examination of the right-upper field reveals decreased breath sounds. Examination of the left-upper field reveals decreased breath sounds. Examination of the right-middle field reveals decreased breath sounds. Examination of the left-middle field reveals decreased breath sounds. Examination of the right-lower field reveals decreased breath sounds. Examination of the left-lower field reveals decreased breath sounds. Decreased breath sounds present. No wheezing, rhonchi or rales.  Musculoskeletal:     Cervical back: Full passive range of motion without pain and neck supple.     Right lower leg: No edema.     Left lower leg: No edema.  Lymphadenopathy:     Head:     Right side of head: No submental, submandibular, tonsillar, preauricular or posterior auricular adenopathy.     Left side of head: No submental, submandibular, tonsillar, preauricular or posterior auricular adenopathy.     Cervical:     Right cervical: No superficial cervical adenopathy.    Left cervical: No superficial cervical adenopathy.  Skin:    General: Skin is warm.     Capillary Refill: Capillary refill takes less than 2 seconds.  Neurological:     General: No focal deficit present.     Mental Status: He is alert, oriented to  person, place, and time and easily aroused. Mental status is at baseline.     GCS: GCS eye subscore is 4. GCS verbal subscore is 5. GCS motor subscore is 6.     Motor: No weakness.  Psychiatric:        Attention and Perception: Attention and perception normal.        Mood and Affect: Mood and affect normal.        Speech: Speech normal.        Behavior: Behavior normal. Behavior is cooperative.        Thought Content: Thought content normal. Thought content does not include homicidal or suicidal ideation. Thought content does not include homicidal or suicidal plan.        Cognition and Memory: Cognition and memory normal.        Judgment: Judgment normal.     Results for orders placed or performed in visit on 09/23/23  Bayer DCA Hb A1c Waived   Collection Time: 09/23/23  3:30 PM  Result Value Ref Range   HB A1C (BAYER DCA - WAIVED) 8.3 (H) 4.8 - 5.6 %  VITAMIN D 25 Hydroxy (Vit-D Deficiency, Fractures)   Collection Time: 09/23/23  3:33 PM  Result Value Ref Range   Vit D, 25-Hydroxy 17.4 (L) 30.0 - 100.0 ng/mL  CBC with Differential/Platelet   Collection Time: 09/23/23  3:33 PM  Result Value Ref Range   WBC 5.8 3.4 - 10.8 x10E3/uL   RBC 5.90 (H) 4.14 - 5.80 x10E6/uL   Hemoglobin 17.4 13.0 - 17.7 g/dL   Hematocrit 16.1 (H) 09.6 - 51.0 %   MCV 88 79 - 97 fL   MCH 29.5 26.6 - 33.0 pg   MCHC 33.6 31.5 - 35.7 g/dL   RDW 04.5 40.9 - 81.1 %   Platelets 167 150 - 450 x10E3/uL   Neutrophils 47 Not Estab. %   Lymphs 41 Not Estab. %   Monocytes 8 Not Estab. %   Eos 3 Not Estab. %   Basos 1 Not Estab. %  Neutrophils Absolute 2.7 1.4 - 7.0 x10E3/uL   Lymphocytes Absolute 2.4 0.7 - 3.1 x10E3/uL   Monocytes Absolute 0.5 0.1 - 0.9 x10E3/uL   EOS (ABSOLUTE) 0.2 0.0 - 0.4 x10E3/uL   Basophils Absolute 0.0 0.0 - 0.2 x10E3/uL   Immature Granulocytes 0 Not Estab. %   Immature Grans (Abs) 0.0 0.0 - 0.1 x10E3/uL  CMP14+EGFR   Collection Time: 09/23/23  3:33 PM  Result Value Ref Range    Glucose 155 (H) 70 - 99 mg/dL   BUN 10 6 - 24 mg/dL   Creatinine, Ser 1.61 (L) 0.76 - 1.27 mg/dL   eGFR 096 >04 VW/UJW/1.19   BUN/Creatinine Ratio 16 9 - 20   Sodium 144 134 - 144 mmol/L   Potassium 4.3 3.5 - 5.2 mmol/L   Chloride 105 96 - 106 mmol/L   CO2 20 20 - 29 mmol/L   Calcium 10.1 8.7 - 10.2 mg/dL   Total Protein 7.4 6.0 - 8.5 g/dL   Albumin 4.3 4.1 - 5.1 g/dL   Globulin, Total 3.1 1.5 - 4.5 g/dL   Bilirubin Total 1.9 (H) 0.0 - 1.2 mg/dL   Alkaline Phosphatase 116 44 - 121 IU/L   AST 33 0 - 40 IU/L   ALT 32 0 - 44 IU/L  Lipid panel   Collection Time: 09/23/23  3:33 PM  Result Value Ref Range   Cholesterol, Total 203 (H) 100 - 199 mg/dL   Triglycerides 147 (H) 0 - 149 mg/dL   HDL 35 (L) >82 mg/dL   VLDL Cholesterol Cal 44 (H) 5 - 40 mg/dL   LDL Chol Calc (NIH) 956 (H) 0 - 99 mg/dL   Chol/HDL Ratio 5.8 (H) 0.0 - 5.0 ratio  Vitamin B12   Collection Time: 09/23/23  3:33 PM  Result Value Ref Range   Vitamin B-12 339 232 - 1,245 pg/mL       11/20/2023    2:24 PM 09/23/2023    3:40 PM 06/12/2023    3:54 PM 03/12/2023    3:17 PM 03/12/2023    3:14 PM  Depression screen PHQ 2/9  Decreased Interest 0 0 0 0 0  Down, Depressed, Hopeless 0 0 0 0 0  PHQ - 2 Score 0 0 0 0 0  Altered sleeping  0 0  0  Tired, decreased energy  0 0  0  Change in appetite  0 0  0  Feeling bad or failure about yourself   0 0  0  Trouble concentrating  0 0  0  Moving slowly or fidgety/restless  0 0  0  Suicidal thoughts  0 0  0  PHQ-9 Score  0 0  0  Difficult doing work/chores  Not difficult at all Not difficult at all  Not difficult at all       11/20/2023    2:24 PM 09/23/2023    3:40 PM 06/12/2023    4:04 PM 03/12/2023    3:14 PM  GAD 7 : Generalized Anxiety Score  Nervous, Anxious, on Edge 0 0 0 0  Control/stop worrying 0 0 0 0  Worry too much - different things 0 0 0 0  Trouble relaxing 0 0 0 0  Restless 0 0 0 0  Easily annoyed or irritable 0 0 0 0  Afraid - awful might happen 0 0 0 0   Total GAD 7 Score 0 0 0 0  Anxiety Difficulty Not difficult at all Not difficult at all Not difficult at all Not  difficult at all   Pertinent labs & imaging results that were available during my care of the patient were reviewed by me and considered in my medical decision making.  Assessment & Plan:  Theadore was seen today for fatigue.  Diagnoses and all orders for this visit:  Acute cough All tests negative. Discussed with patient symptomatic care and that he can take a home covid test if he would like and let us know of the results. Continue to monitor BG at home with illness.  -     RSV Ag, Immunochr, Waived -     Veritor Flu A/B Waived  Type 2 diabetes mellitus with hypertriglyceridemia (HCC) As above.     Continue all other maintenance medications.  Follow up plan: Return for appt in March .   Continue healthy lifestyle choices, including diet (rich in fruits, vegetables, and lean proteins, and low in salt and simple carbohydrates) and exercise (at least 30 minutes of moderate physical activity daily).  Written and verbal instructions provided   The above assessment and management plan was discussed with the patient. The patient verbalized understanding of and has agreed to the management plan. Patient is aware to call the clinic if they develop any new symptoms or if symptoms persist or worsen. Patient is aware when to return to the clinic for a follow-up visit. Patient educated on when it is appropriate to go to the emergency department.   Neale Burly, DNP-FNP [facility] Medicine [address] [place], [place] [address] [phone]

## 2023-12-10 ENCOUNTER — Other Ambulatory Visit: Payer: Self-pay | Admitting: Family Medicine

## 2023-12-10 DIAGNOSIS — E119 Type 2 diabetes mellitus without complications: Secondary | ICD-10-CM

## 2023-12-23 ENCOUNTER — Ambulatory Visit: Payer: BC Managed Care – PPO | Admitting: Family Medicine

## 2023-12-26 ENCOUNTER — Encounter: Payer: Self-pay | Admitting: Family Medicine

## 2024-01-01 ENCOUNTER — Ambulatory Visit: Payer: Self-pay

## 2024-01-01 NOTE — Telephone Encounter (Signed)
 Chief Complaint: Mild abrasion from skin rash Symptoms: see above Frequency: 4 days Pertinent Negatives: Patient denies fever, rash, discharge Disposition: [] ED /[] Urgent Care (no appt availability in office) / [] Appointment(In office/virtual)/ []  [facility]/ [x] Home Care/ [] Refused Recommended Disposition /[] [facility]/ []  Follow-up with PCP Additional Notes: Patient called stating he has been using a new fragranced soap that caused irritation on his glans. Patient states it is a small cut that is causing irritation and discomfort when rubbing against clothing. Patient denies discharge, fever, rash. Advised patient to stop use of this soap, and to only use antibacterial soap and warm water. Advised patient to keep this area clean and covered, and he can use antibiotic ointment. Advised patient to call back if symptoms worsen or do not heal within 7 days. Patient verbalized understanding.    Copied from CRM [id number]. Topic: Clinical - Medical Advice >> Jan 01, 2024  2:33 PM Patsy Lager T wrote: Reason for CRM: patient called stated he used a new soap [employer] that has given him a rash in his private area that has left an open wound. Reason for Disposition  Mild localized rash  Answer Assessment - Initial Assessment Questions 1. APPEARANCE of RASH: "Describe the rash."      Rash appears like a split in skin, red 2. LOCATION: "Where is the rash located?"      On tip of glans 3. NUMBER: "How many spots are there?"      One localized area to genital area (tip of glans) 4. SIZE: "How big are the spots?" (Inches, centimeters or compare to size of a coin)      1 spot  5. ONSET: "When did the rash start?"      4 6. ITCHING: "Does the rash itch?" If Yes, ask: "How bad is the itch?"  (Scale 0-10; or none, mild, moderate, severe)     No 7. PAIN: "Does the rash hurt?" If Yes, ask: "How bad is the pain?"  (Scale 0-10; or none, mild, moderate, severe)    - NONE  (0): no pain    - MILD (1-3): doesn't interfere with normal activities     - MODERATE (4-7): interferes with normal activities or awakens from sleep     - SEVERE (8-10): excruciating pain, unable to do any normal activities     Patient states there is irritation to the skin, not necessarily  8. OTHER SYMPTOMS: "Do you have any other symptoms?" (e.g., fever)     No  Protocols used: Rash or Redness - Localized-A-AH

## 2024-01-02 DIAGNOSIS — L089 Local infection of the skin and subcutaneous tissue, unspecified: Secondary | ICD-10-CM | POA: Diagnosis not present

## 2024-01-02 DIAGNOSIS — N4889 Other specified disorders of penis: Secondary | ICD-10-CM | POA: Diagnosis not present

## 2024-01-02 NOTE — Telephone Encounter (Signed)
 Appointment 01/07/24 LS

## 2024-01-05 ENCOUNTER — Encounter: Payer: Self-pay | Admitting: Nurse Practitioner

## 2024-01-05 ENCOUNTER — Ambulatory Visit: Payer: Self-pay | Admitting: *Deleted

## 2024-01-05 ENCOUNTER — Telehealth: Admitting: Nurse Practitioner

## 2024-01-05 DIAGNOSIS — H1033 Unspecified acute conjunctivitis, bilateral: Secondary | ICD-10-CM | POA: Insufficient documentation

## 2024-01-05 MED ORDER — OFLOXACIN 0.3 % OP SOLN
1.0000 [drp] | Freq: Four times a day (QID) | OPHTHALMIC | 0 refills | Status: DC
Start: 2024-01-05 — End: 2024-01-28

## 2024-01-05 NOTE — Progress Notes (Signed)
 Virtual Visit via video Note Due to COVID-19 pandemic this visit was conducted virtually. This visit type was conducted due to national recommendations for restrictions regarding the COVID-19 Pandemic (e.g. social distancing, sheltering in place) in an effort to limit this patient's exposure and mitigate transmission in our community. All issues noted in this document were discussed and addressed.  A physical exam was not performed with this format.   I connected with Docia Chuck on 01/05/2024 at 1055 by name and date of birth and verified that I am speaking with the correct person using two identifiers. [name] is currently located at home during visit. The provider, Martina Sinner, DNP is located in their office at time of visit.  I discussed the limitations, risks, security and privacy concerns of performing an evaluation and management service by virtual visit and the availability of in person appointments. I also discussed with the patient that there may be a patient responsible charge related to this service. The patient expressed understanding and agreed to proceed.  Subjective:  Patient ID: Jack Porter, male    DOB: Feb 18, 1981, 43 y.o.   MRN: [id number]  Chief Complaint:  Conjunctivitis (" Started Saturday and got worst, woke up this morning with crutsy eyes")   HPI: Jack Porter is a 43 y.o. male presenting on 01/05/2024 for Conjunctivitis (" Started Saturday and got worst, woke up this morning with crutsy eyes")  43 y.o. male with burning, redness, discharge and mattering in both eyes for 3 days.  No other symptoms.  No significant prior ophthalmological history. No change in visual acuity, no photophobia, no severe eye pain.  Past Medical History:  Diagnosis Date   GERD (gastroesophageal reflux disease)    H/O hiatal hernia    Type 2 diabetes mellitus (HCC)     Past Surgical History:  Procedure Laterality Date   CHOLECYSTECTOMY N/A 01/21/2014    Procedure: LAPAROSCOPIC CHOLECYSTECTOMY;  Surgeon: Atilano Ina, MD;  Location: [facility];  Service: General;  Laterality: N/A;    Social History   Socioeconomic History   Marital status: Married    Spouse name: Not on file   Number of children: Not on file   Years of education: Not on file   Highest education level: 12th grade  Occupational History   Not on file  Tobacco Use   Smoking status: Former    Current packs/day: 0.25    Average packs/day: 0.3 packs/day for 3.0 years (0.8 ttl pk-yrs)    Types: Cigarettes   Smokeless tobacco: Never  Vaping Use   Vaping status: Never Used  Substance and Sexual Activity   Alcohol use: No    Comment: rarely   Drug use: No   Sexual activity: Yes  Other Topics Concern   Not on file  Social History Narrative   Not on file   Social Drivers of Health   Financial Resource Strain: Low Risk  (09/22/2023)   Overall Financial Resource Strain (CARDIA)    Difficulty of Paying Living Expenses: Not very hard  Food Insecurity: No Food Insecurity (01/02/2024)   Received from [facility]   Hunger Vital Sign    Worried About Running Out of Food in the Last Year: Never true    Ran Out of Food in the Last Year: Never true  Transportation Needs: No Transportation Needs (01/02/2024)   [facility] from [facility] - Transportation    Lack of Transportation (Medical): No  Lack of Transportation (Non-Medical): No  Physical Activity: Insufficiently Active (09/22/2023)   Exercise Vital Sign    Days of Exercise per Week: 3 days    Minutes of Exercise per Session: 20 min  Stress: No Stress Concern Present (09/22/2023)   [employer] of Occupational Health - Occupational Stress Questionnaire    Feeling of Stress : Not at all  Social Connections: Moderately Integrated (09/22/2023)   Social Connection and Isolation Panel [NHANES]    Frequency of Communication with Friends and Family: Once a week    Frequency of Social Gatherings with  Friends and Family: Three times a week    Attends Religious Services: 1 to 4 times per year    Active Member of Clubs or Organizations: No    Attends Banker Meetings: Not on file    Marital Status: Married  Intimate Partner Violence: Not on file    Outpatient Encounter Medications as of 01/05/2024  Medication Sig   ofloxacin (OCUFLOX) 0.3 % ophthalmic solution Place 1 drop into both eyes 4 (four) times daily.   blood glucose meter kit and supplies KIT Dispense based on patient and insurance preference. Use up to four times daily as directed. (FOR ICD-9 250.00, 250.01).   dapagliflozin propanediol (FARXIGA) 10 MG TABS tablet Take 1 tablet (10 mg total) by mouth daily before breakfast.   glipiZIDE (GLUCOTROL) 5 MG tablet TAKE 1 TABLET BY MOUTH ONCE DAILY BEFORE BREAKFAST   glucose blood (IGLUCOSE TEST STRIPS) test strip Use as instructed   losartan (COZAAR) 25 MG tablet Take 1 tablet by mouth once daily   metFORMIN (GLUCOPHAGE) 1000 MG tablet Take 1 tablet (1,000 mg total) by mouth 2 (two) times daily with a meal.   rosuvastatin (CRESTOR) 5 MG tablet Take 1 tablet (5 mg total) by mouth daily.   tirzepatide (MOUNJARO) 15 MG/0.5ML Pen Inject 15 mg into the skin once a week.   Vitamin D, Ergocalciferol, (DRISDOL) 1.25 MG (50000 UNIT) CAPS capsule Take 1 capsule (50,000 Units total) by mouth every 7 (seven) days.   No facility-administered encounter medications on file as of 01/05/2024.    No Known Allergies  Review of Systems  Constitutional:  Negative for chills and fatigue.  HENT:  Positive for rhinorrhea. Negative for congestion.   Eyes:  Positive for discharge and redness. Negative for visual disturbance.  Cardiovascular:  Negative for chest pain and leg swelling.  Allergic/Immunologic: Negative for environmental allergies.  Neurological:  Negative for dizziness and headaches.    Observations/Objective: No vital signs or physical exam, this was a virtual health  encounter.  Pt alert and oriented, answers all questions appropriately, and able to speak in full sentences.  Eyes: both eyes with findings of typical conjunctivitis noted; erythema and discharge. PERRLA, no foreign body noted. No periorbital cellulitis. The corneas are clear and fundi normal. Visual acuity normal  Assessment and Plan: Dustine was seen today for conjunctivitis.  Diagnoses and all orders for this visit:  Acute bacterial conjunctivitis of both eyes -     ofloxacin (OCUFLOX) 0.3 % ophthalmic solution; Place 1 drop into both eyes 4 (four) times daily.    Ples 43 year old Hispanic male seen via telehealth for conjunctivitis, no acute distress ASSESSMENT:  Conjunctivitis - probably bacterial  PLAN:  Antibiotic drops per order. Hygiene discussed. If other family members develop same condition, may use same medication for them if they are not known to be allergic to it. Call prn.  Relevant past medical, surgical, family, and social history  reviewed and updated as indicated.  Allergies and medications reviewed and updated. Follow Up Instructions: Return if symptoms worsen or fail to improve.    I discussed the assessment and treatment plan with the patient. The patient was provided an opportunity to ask questions and all were answered. The patient agreed with the plan and demonstrated an understanding of the instructions.   The patient was advised to call back or seek an in-person evaluation if the symptoms worsen or if the condition fails to improve as anticipated.  The above assessment and management plan was discussed with the patient. The patient verbalized understanding of and has agreed to the management plan. Patient is aware to call the clinic if they develop any new symptoms or if symptoms persist or worsen. Patient is aware when to return to the clinic for a follow-up visit. Patient educated on when it is appropriate to go to the emergency department.    I provided 12  minutes of time during this video encounter.   Arrie Aran Santa Lighter, DNP [facility] Medicine [address] [place], [place] [address] [phone] 01/05/2024

## 2024-01-05 NOTE — Telephone Encounter (Signed)
 Patient calling with eye irritation-red- right Reason for Disposition  [1] Only 1 eye is red AND [2] present > 48 hours  Answer Assessment - Initial Assessment Questions 1. EYE DISCHARGE: "Is the discharge in one or both eyes?" "What color is it?" "How much is there?" "When did the discharge start?"      Watery discharge, eye feels dry, started Saturday 2. REDNESS OF SCLERA: "Is the redness in one or both eyes?" "When did the redness start?"      R side of eye- white of eye 3. EYELIDS: "Are the eyelids red or swollen?" If Yes, ask: "How much?"      puffy 4. VISION: "Is there any difficulty seeing clearly?"      no 5. PAIN: "Is there any pain? If Yes, ask: "How bad is it?" (Scale 1-10; or mild, moderate, severe)    - MILD (1-3): doesn't interfere with normal activities     - MODERATE (4-7): interferes with normal activities or awakens from sleep    - SEVERE (8-10): excruciating pain, unable to do any normal activities       No pain- just with rubbing- irritation 6. CONTACT LENS: "Do you wear contacts?"     no 7. OTHER SYMPTOMS: "Do you have any other symptoms?" (e.g., fever, runny nose, cough)     no  Protocols used: Eye - Pus or Discharge-A-AH, Eye - Red Without Pus-A-AH

## 2024-01-05 NOTE — Telephone Encounter (Signed)
 PATIENT HAS APPOINTMENT TODAY

## 2024-01-05 NOTE — Telephone Encounter (Signed)
  Chief Complaint: eye irritation Symptoms: redness, irritation- right eye Frequency: started Saturday Pertinent Negatives: Patient denies other symptoms- allergy symptoms Disposition: ED /[] Urgent Care (no appt availability in office) / [x] Appointment(In office/virtual)/ []  [facility]/ [] Home Care/ [] Refused Recommended Disposition /[] [facility]/ []  Follow-up with PCP Additional Notes: Patient states his R eye is irritated and watery, red

## 2024-01-07 ENCOUNTER — Encounter: Payer: Self-pay | Admitting: Family Medicine

## 2024-01-07 ENCOUNTER — Ambulatory Visit: Admitting: Family Medicine

## 2024-01-07 VITALS — BP 122/80 | HR 97 | Temp 98.7°F | Ht 73.0 in | Wt 287.0 lb

## 2024-01-07 DIAGNOSIS — E1169 Type 2 diabetes mellitus with other specified complication: Secondary | ICD-10-CM | POA: Diagnosis not present

## 2024-01-07 DIAGNOSIS — E119 Type 2 diabetes mellitus without complications: Secondary | ICD-10-CM

## 2024-01-07 DIAGNOSIS — H5789 Other specified disorders of eye and adnexa: Secondary | ICD-10-CM

## 2024-01-07 DIAGNOSIS — E781 Pure hyperglyceridemia: Secondary | ICD-10-CM

## 2024-01-07 DIAGNOSIS — E559 Vitamin D deficiency, unspecified: Secondary | ICD-10-CM | POA: Diagnosis not present

## 2024-01-07 DIAGNOSIS — R17 Unspecified jaundice: Secondary | ICD-10-CM

## 2024-01-07 DIAGNOSIS — Z7984 Long term (current) use of oral hypoglycemic drugs: Secondary | ICD-10-CM | POA: Diagnosis not present

## 2024-01-07 DIAGNOSIS — H5710 Ocular pain, unspecified eye: Secondary | ICD-10-CM

## 2024-01-07 DIAGNOSIS — Z7985 Long-term (current) use of injectable non-insulin antidiabetic drugs: Secondary | ICD-10-CM

## 2024-01-07 DIAGNOSIS — E785 Hyperlipidemia, unspecified: Secondary | ICD-10-CM

## 2024-01-07 LAB — BAYER DCA HB A1C WAIVED: HB A1C (BAYER DCA - WAIVED): 8.2 % — ABNORMAL HIGH (ref 4.8–5.6)

## 2024-01-07 MED ORDER — ERYTHROMYCIN 5 MG/GM OP OINT
1.0000 | TOPICAL_OINTMENT | Freq: Four times a day (QID) | OPHTHALMIC | 0 refills | Status: AC
Start: 2024-01-07 — End: 2024-01-14

## 2024-01-07 MED ORDER — GLIPIZIDE 10 MG PO TABS: 10.0000 mg | ORAL_TABLET | Freq: Every day | ORAL | 1 refills | Status: DC

## 2024-01-07 NOTE — Progress Notes (Signed)
 Will increase glipizide to 10 mg. Will await other recommendations from pharmacy.

## 2024-01-07 NOTE — Progress Notes (Signed)
 Subjective:  Patient ID: Jack Porter, male    DOB: Sep 07, 1981, 43 y.o.   MRN: [id number]  Patient Care Team: Arrie Senate, FNP as PCP - General (Family Medicine) Delora Fuel, OD (Optometry)   Chief Complaint:  Medical Management of Chronic Issues (3 month follow up /No acute concerns)  HPI: Jack Porter is a 43 y.o. male presenting on 01/07/2024 for Medical Management of Chronic Issues (3 month follow up /No acute concerns)  HPI Type 2 diabetes mellitus without complication, without long-term current use of insulin (HCC) High at home: 145; Low at home: 20, Taking medication(s): farxiga, glipizide, mounjaro, metformin  Last eye exam: completed in September  Last foot exam: due in September  Last A1c:  Lab Results  Component Value Date   HGBA1C 8.3 (H) 09/23/2023   Nephropathy screen indicated?: due today  Last flu, zoster and/or pneumovax:  Immunization History  Administered Date(s) Administered   MODERNA SARS COV-2 Pediatric Vaccination 6mos to <2yrs 01/06/2020, 02/03/2020   Moderna Sars-Covid-2 Vaccination 01/06/2020, 02/03/2020, 02/02/2021    ROS: Denies dizziness, LOC, polyuria, polydipsia, unintended weight loss/gain, foot ulcerations, numbness or tingling in extremities, shortness of breath or chest pain.   Hyperlipidemia associated with type 2 diabetes mellitus (HCC) Lipid/Cholesterol, Follow-up  Last lipid panel Other pertinent labs  Lab Results  Component Value Date   CHOL 203 (H) 09/23/2023   HDL 35 (L) 09/23/2023   LDLCALC 124 (H) 09/23/2023   TRIG 248 (H) 09/23/2023   CHOLHDL 5.8 (H) 09/23/2023   Lab Results  Component Value Date   ALT 32 09/23/2023   AST 33 09/23/2023   PLT 167 09/23/2023   TSH 2.420 01/31/2023     He was last seen for this 4 months ago.  Management since that visit includes crestor 5mg .  He reports fair compliance with treatment. He is not having side effects.   Symptoms: No chest pain No chest  pressure/discomfort  No dyspnea No lower extremity edema  No numbness or tingling of extremity No orthopnea  No palpitations No paroxysmal nocturnal dyspnea  No speech difficulty No syncope   Current diet:  trying to watch what he is eating.  Current exercise:  not as much, more labor at work  The 10-year ASCVD risk score (Arnett DK, et al., 2019) is: 6.6%  --------------------------------------------------------------------------------------------------- Vitamin D deficiency Vitamin D deficiency, follow-up  Lab Results  Component Value Date   VD25OH 17.4 (L) 09/23/2023   VD25OH 8.7 (L) 06/12/2023   CALCIUM 10.1 09/23/2023   CALCIUM 9.2 06/12/2023        Wt Readings from Last 3 Encounters:  11/20/23 291 lb (132 kg)  09/23/23 287 lb (130.2 kg)  06/12/23 299 lb (135.6 kg)    He was last seen for vitamin D deficiency 4 months ago.  Management since that visit includes none.  Symptoms: No change in energy level No numbness or tingling  No bone pain No unexplained fracture   ---------------------------------------------------------------------------------------------------  5. Eye irritation  He was seen by telehealth on 01/05/24 for eye irritation. States that eyes are red, stuck together, constant drainage, yellow. Painful when he placing drops in. More on the right side than the left. He has only done warm compresses once. Wears glasses, no contacts. Reports blurry vision, pain with eye movement. States that he has sensation of something in his eye. He is able to keep his eye open. States that symptoms have worsened since starting ofloxacin  Relevant past medical, surgical, family,  and social history reviewed and updated as indicated.  Allergies and medications reviewed and updated. Data reviewed: Chart in Epic.   Past Medical History:  Diagnosis Date   GERD (gastroesophageal reflux disease)    H/O hiatal hernia    Type 2 diabetes mellitus (HCC)     Past Surgical  History:  Procedure Laterality Date   CHOLECYSTECTOMY N/A 01/21/2014   Procedure: LAPAROSCOPIC CHOLECYSTECTOMY;  Surgeon: Atilano Ina, MD;  Location: [facility];  Service: General;  Laterality: N/A;    Social History   Socioeconomic History   Marital status: Married    Spouse name: Not on file   Number of children: Not on file   Years of education: Not on file   Highest education level: 12th grade  Occupational History   Not on file  Tobacco Use   Smoking status: Former    Current packs/day: 0.25    Average packs/day: 0.3 packs/day for 3.0 years (0.8 ttl pk-yrs)    Types: Cigarettes   Smokeless tobacco: Never  Vaping Use   Vaping status: Never Used  Substance and Sexual Activity   Alcohol use: No    Comment: rarely   Drug use: No   Sexual activity: Yes  Other Topics Concern   Not on file  Social History Narrative   Not on file   Social Drivers of Health   Financial Resource Strain: Low Risk  (09/22/2023)   Overall Financial Resource Strain (CARDIA)    Difficulty of Paying Living Expenses: Not very hard  Food Insecurity: No Food Insecurity (01/02/2024)   [facility]   Hunger Vital Sign    Worried About Running Out of Food in the Last Year: Never true    Ran Out of Food in the Last Year: Never true  Transportation Needs: No Transportation Needs (01/02/2024)   Received [facility]   PRAPARE - Transportation    Lack of Transportation (Medical): No    Lack of Transportation (Non-Medical): No  Physical Activity: Insufficiently Active (09/22/2023)   Exercise Vital Sign    Days of Exercise per Week: 3 days    Minutes of Exercise per Session: 20 min  Stress: No Stress Concern Present (09/22/2023)   [employer] of Occupational Health - Occupational Stress Questionnaire    Feeling of Stress : Not at all  Social Connections: Moderately Integrated (09/22/2023)   Social Connection and Isolation Panel [NHANES]    Frequency of Communication with  Friends and Family: Once a week    Frequency of Social Gatherings with Friends and Family: Three times a week    Attends Religious Services: 1 to 4 times per year    Active Member of Clubs or Organizations: No    Attends Banker Meetings: Not on file    Marital Status: Married  Intimate Partner Violence: Not on file    Outpatient Encounter Medications as of 01/07/2024  Medication Sig   blood glucose meter kit and supplies KIT Dispense based on patient and insurance preference. Use up to four times daily as directed. (FOR ICD-9 250.00, 250.01).   dapagliflozin propanediol (FARXIGA) 10 MG TABS tablet Take 1 tablet (10 mg total) by mouth daily before breakfast.   glipiZIDE (GLUCOTROL) 5 MG tablet TAKE 1 TABLET BY MOUTH ONCE DAILY BEFORE BREAKFAST   glucose blood (IGLUCOSE TEST STRIPS) test strip Use as instructed   losartan (COZAAR) 25 MG tablet Take 1 tablet by mouth once daily   metFORMIN (GLUCOPHAGE) 1000 MG tablet  Take 1 tablet (1,000 mg total) by mouth 2 (two) times daily with a meal.   ofloxacin (OCUFLOX) 0.3 % ophthalmic solution Place 1 drop into both eyes 4 (four) times daily.   rosuvastatin (CRESTOR) 5 MG tablet Take 1 tablet (5 mg total) by mouth daily.   tirzepatide (MOUNJARO) 15 MG/0.5ML Pen Inject 15 mg into the skin once a week.   Vitamin D, Ergocalciferol, (DRISDOL) 1.25 MG (50000 UNIT) CAPS capsule Take 1 capsule (50,000 Units total) by mouth every 7 (seven) days.   No facility-administered encounter medications on file as of 01/07/2024.    No Known Allergies  Review of Systems As per HPI  Objective:  BP 122/80   Pulse 97   Temp 98.7 F (37.1 C)   Ht 6\' 1"  (1.854 m)   Wt 287 lb (130.2 kg)   SpO2 98%   BMI 37.87 kg/m    Wt Readings from Last 3 Encounters:  01/07/24 287 lb (130.2 kg)  11/20/23 291 lb (132 kg)  09/23/23 287 lb (130.2 kg)   Physical Exam Constitutional:      General: He is awake. He is not in acute distress.    Appearance: Normal  appearance. He is well-developed and well-groomed. He is morbidly obese. He is not ill-appearing, toxic-appearing or diaphoretic.  Eyes:     General:        Right eye: Discharge present. No foreign body or hordeolum.        Left eye: Discharge present.No foreign body or hordeolum.     Extraocular Movements: Extraocular movements intact.     Conjunctiva/sclera:     Right eye: Right conjunctiva is injected. Chemosis, exudate and hemorrhage present.     Left eye: Left conjunctiva is injected. Exudate and hemorrhage present.     Comments: Increased exudate and erythema on right than left, purulent drainage on right   Cardiovascular:     Rate and Rhythm: Normal rate and regular rhythm.     Pulses: Normal pulses.          Radial pulses are 2+ on the right side and 2+ on the left side.       Posterior tibial pulses are 2+ on the right side and 2+ on the left side.     Heart sounds: Normal heart sounds. No murmur heard.    No gallop.  Pulmonary:     Effort: Pulmonary effort is normal. No respiratory distress.     Breath sounds: Normal breath sounds. No stridor. No wheezing, rhonchi or rales.  Musculoskeletal:     Cervical back: Full passive range of motion without pain and neck supple.     Right lower leg: No edema.     Left lower leg: No edema.  Skin:    General: Skin is warm.     Capillary Refill: Capillary refill takes less than 2 seconds.  Neurological:     General: No focal deficit present.     Mental Status: He is alert, oriented to person, place, and time and easily aroused. Mental status is at baseline.     GCS: GCS eye subscore is 4. GCS verbal subscore is 5. GCS motor subscore is 6.     Motor: No weakness.  Psychiatric:        Attention and Perception: Attention and perception normal.        Mood and Affect: Mood and affect normal.        Speech: Speech normal.        Behavior:  Behavior normal. Behavior is cooperative.        Thought Content: Thought content normal. Thought  content does not include homicidal or suicidal ideation. Thought content does not include homicidal or suicidal plan.        Cognition and Memory: Cognition and memory normal.        Judgment: Judgment normal.      Results for orders placed or performed in visit on 11/20/23  RSV Ag, Immunochr, Waived   Collection Time: 11/20/23  2:55 PM   Specimen: Nasopharyngeal   Naso  Result Value Ref Range   RSV Ag, Immunochr, Waived Negative Negative  Veritor Flu A/B Waived   Collection Time: 11/20/23  2:55 PM  Result Value Ref Range   Influenza A Negative Negative   Influenza B Negative Negative       01/07/2024    1:17 PM 11/20/2023    2:24 PM 09/23/2023    3:40 PM 06/12/2023    3:54 PM 03/12/2023    3:17 PM  Depression screen PHQ 2/9  Decreased Interest 0 0 0 0 0  Down, Depressed, Hopeless 0 0 0 0 0  PHQ - 2 Score 0 0 0 0 0  Altered sleeping   0 0   Tired, decreased energy   0 0   Change in appetite   0 0   Feeling bad or failure about yourself    0 0   Trouble concentrating   0 0   Moving slowly or fidgety/restless   0 0   Suicidal thoughts   0 0   PHQ-9 Score   0 0   Difficult doing work/chores   Not difficult at all Not difficult at all        01/07/2024    1:17 PM 11/20/2023    2:24 PM 09/23/2023    3:40 PM 06/12/2023    4:04 PM  GAD 7 : Generalized Anxiety Score  Nervous, Anxious, on Edge 0 0 0 0  Control/stop worrying 0 0 0 0  Worry too much - different things 0 0 0 0  Trouble relaxing 0 0 0 0  Restless 0 0 0 0  Easily annoyed or irritable 0 0 0 0  Afraid - awful might happen 0 0 0 0  Total GAD 7 Score 0 0 0 0  Anxiety Difficulty Not difficult at all Not difficult at all Not difficult at all Not difficult at all   Pertinent labs & imaging results that were available during my care of the patient were reviewed by me and considered in my medical decision making.  Assessment & Plan:  Jack Porter was seen today for medical management of chronic issues.  Diagnoses and all orders  for this visit:  1. Type 2 diabetes mellitus without complication, without long-term current use of insulin (HCC) (Primary) Patient is not at goal. Optimized on metformin, mounjaro, and farxiga. Will increase glipizide to 10 mg daily to optimize. Will refer to pharmacy as below to coordinate care. Can consider actos as patient does not have CHF and does have history of fatty liver disease. Will consider adding basal insulin. Labs as below. Will communicate results to patient once available. Will await results to determine next steps.  - Bayer DCA Hb A1c Waived - CBC with Differential/Platelet - CMP14+EGFR - Lipid panel - VITAMIN D 25 Hydroxy (Vit-D Deficiency, Fractures) - Vitamin B12 - Microalbumin / creatinine urine ratio - AMB Referral [facility] Care Management - glipiZIDE (GLUCOTROL) 10 MG tablet; Take 1  tablet (10 mg total) by mouth daily before breakfast.  Dispense: 90 tablet; Refill: 1  2. Type 2 diabetes mellitus with hypertriglyceridemia (HCC) As above.  - Bayer DCA Hb A1c Waived - CBC with Differential/Platelet - CMP14+EGFR - Lipid panel - VITAMIN D 25 Hydroxy (Vit-D Deficiency, Fractures) - Vitamin B12 - Microalbumin / creatinine urine ratio - AMB Referral [facility] Care Management - glipiZIDE (GLUCOTROL) 10 MG tablet; Take 1 tablet (10 mg total) by mouth daily before breakfast.  Dispense: 90 tablet; Refill: 1  3. Diabetes mellitus treated with oral medication (HCC) As above.  - Bayer DCA Hb A1c Waived - CBC with Differential/Platelet - CMP14+EGFR - Lipid panel - VITAMIN D 25 Hydroxy (Vit-D Deficiency, Fractures) - Vitamin B12 - Microalbumin / creatinine urine ratio - AMB Referral [facility] Care Management - glipiZIDE (GLUCOTROL) 10 MG tablet; Take 1 tablet (10 mg total) by mouth daily before breakfast.  Dispense: 90 tablet; Refill: 1  4. Hyperlipidemia associated with type 2 diabetes mellitus (HCC) Labs as below. Will communicate results to patient once available. Will await  results to determine next steps.  Continue crestor.  - Lipid panel  5. Morbid obesity (HCC) Discussed with patient to continue healthy lifestyle choices, including diet (rich in fruits, vegetables, and lean proteins, and low in salt and simple carbohydrates) and exercise (at least 30 minutes of moderate physical activity daily). Limit beverages high is sugar. Recommended at least 80-100 oz of water daily.  Labs as below. Will communicate results to patient once available. Will await results to determine next steps.  - CBC with Differential/Platelet - CMP14+EGFR - Lipid panel - VITAMIN D 25 Hydroxy (Vit-D Deficiency, Fractures) - Vitamin B12 - Microalbumin / creatinine urine ratio  6. Vitamin D deficiency Labs as below. Will communicate results to patient once available. Will await results to determine next steps.  - VITAMIN D 25 Hydroxy (Vit-D Deficiency, Fractures) - Vitamin B12  7. Long-term current use of injectable noninsulin antidiabetic medication As above.  - Bayer DCA Hb A1c Waived - CBC with Differential/Platelet - CMP14+EGFR - Lipid panel - VITAMIN D 25 Hydroxy (Vit-D Deficiency, Fractures) - Vitamin B12 - Microalbumin / creatinine urine ratio - AMB Referral [facility] Care Management - glipiZIDE (GLUCOTROL) 10 MG tablet; Take 1 tablet (10 mg total) by mouth daily before breakfast.  Dispense: 90 tablet; Refill: 1  8. Eye irritation Urgent referral placed as below.  Will switch abx to below.  Recommend that patient follow up with optometry today. Discussed red flag symptoms and when to follow up.  - Ambulatory referral to Ophthalmology - erythromycin ophthalmic ointment; Place 1 Application into both eyes in the morning, at noon, in the evening, and at bedtime for 7 days. 1-cm ribbon into affected eye(s)  Dispense: 28 g; Refill: 0  9. Pain on movement of eye As above.  - Ambulatory referral to Ophthalmology - erythromycin ophthalmic ointment; Place 1 Application into both  eyes in the morning, at noon, in the evening, and at bedtime for 7 days. 1-cm ribbon into affected eye(s)  Dispense: 28 g; Refill: 0  Continue all other maintenance medications.  Follow up plan: Return in about 3 months (around 04/07/2024).   Continue healthy lifestyle choices, including diet (rich in fruits, vegetables, and lean proteins, and low in salt and simple carbohydrates) and exercise (at least 30 minutes of moderate physical activity daily).  Written and verbal instructions provided   The above assessment and management plan was discussed with the patient. The patient verbalized understanding  of and has agreed to the management plan. Patient is aware to call the clinic if they develop any new symptoms or if symptoms persist or worsen. Patient is aware when to return to the clinic for a follow-up visit. Patient educated on when it is appropriate to go to the emergency department.   Neale Burly, DNP-FNP [facility] [address] [place], [place] [address] [phone]

## 2024-01-08 ENCOUNTER — Telehealth: Payer: Self-pay | Admitting: Family Medicine

## 2024-01-08 LAB — CBC WITH DIFFERENTIAL/PLATELET
Basophils Absolute: 0 10*3/uL (ref 0.0–0.2)
Basos: 1 %
EOS (ABSOLUTE): 0.1 10*3/uL (ref 0.0–0.4)
Eos: 2 %
Hematocrit: 46.5 % (ref 37.5–51.0)
Hemoglobin: 15.9 g/dL (ref 13.0–17.7)
Immature Grans (Abs): 0 10*3/uL (ref 0.0–0.1)
Immature Granulocytes: 0 %
Lymphocytes Absolute: 1.5 10*3/uL (ref 0.7–3.1)
Lymphs: 35 %
MCH: 30.5 pg (ref 26.6–33.0)
MCHC: 34.2 g/dL (ref 31.5–35.7)
MCV: 89 fL (ref 79–97)
Monocytes Absolute: 0.3 10*3/uL (ref 0.1–0.9)
Monocytes: 8 %
Neutrophils Absolute: 2.4 10*3/uL (ref 1.4–7.0)
Neutrophils: 54 %
Platelets: 152 10*3/uL (ref 150–450)
RBC: 5.22 x10E6/uL (ref 4.14–5.80)
RDW: 13.3 % (ref 11.6–15.4)
WBC: 4.4 10*3/uL (ref 3.4–10.8)

## 2024-01-08 LAB — CMP14+EGFR
ALT: 36 IU/L (ref 0–44)
AST: 42 IU/L — ABNORMAL HIGH (ref 0–40)
Albumin: 4.1 g/dL (ref 4.1–5.1)
Alkaline Phosphatase: 101 IU/L (ref 44–121)
BUN/Creatinine Ratio: 17 (ref 9–20)
BUN: 10 mg/dL (ref 6–24)
Bilirubin Total: 1.6 mg/dL — ABNORMAL HIGH (ref 0.0–1.2)
CO2: 26 mmol/L (ref 20–29)
Calcium: 9.4 mg/dL (ref 8.7–10.2)
Chloride: 102 mmol/L (ref 96–106)
Creatinine, Ser: 0.59 mg/dL — ABNORMAL LOW (ref 0.76–1.27)
Globulin, Total: 2.9 g/dL (ref 1.5–4.5)
Glucose: 216 mg/dL — ABNORMAL HIGH (ref 70–99)
Potassium: 4.4 mmol/L (ref 3.5–5.2)
Sodium: 140 mmol/L (ref 134–144)
Total Protein: 7 g/dL (ref 6.0–8.5)
eGFR: 123 mL/min/{1.73_m2} (ref >59)

## 2024-01-08 LAB — LIPID PANEL
Chol/HDL Ratio: 4.7 ratio (ref 0.0–5.0)
Cholesterol, Total: 136 mg/dL (ref 100–199)
HDL: 29 mg/dL — ABNORMAL LOW (ref >39)
LDL Chol Calc (NIH): 87 mg/dL (ref 0–99)
Triglycerides: 106 mg/dL (ref 0–149)
VLDL Cholesterol Cal: 20 mg/dL (ref 5–40)

## 2024-01-08 LAB — VITAMIN B12: Vitamin B-12: 345 pg/mL (ref 232–1245)

## 2024-01-08 LAB — VITAMIN D 25 HYDROXY (VIT D DEFICIENCY, FRACTURES): Vit D, 25-Hydroxy: 15.3 ng/mL — ABNORMAL LOW (ref 30.0–100.0)

## 2024-01-08 NOTE — Telephone Encounter (Signed)
 Copied from CRM [id number]. Topic: Clinical - Medical Advice >> Jan 08, 2024 12:00 PM Carlatta H wrote: Reason for CRM: Patient would like a call back about cream given for eye//Please call patient

## 2024-01-08 NOTE — Telephone Encounter (Signed)
 Copied from CRM [id number]. Topic: Clinical - Medical Advice >> Jan 08, 2024 12:00 PM Carlatta H wrote: Reason for CRM: Patient would like a call back about cream given for eye//Please call patient >> Jan 08, 2024  3:04 PM Fredrica W wrote: Patient states she did not receive note for work for today and tomorrow. Was told it would be in MyChart. There is one in MyChart but its for 15 minutes for medication administration not to be off for today and tomorrow. He would like to know if it could also be printed and he can come and pick up. Would like a callback or notice once completed. Thank You

## 2024-01-08 NOTE — Telephone Encounter (Addendum)
 Patient requesting a note to be out of work for today and tomorrow.  Provider wrote a note for patient to get from Mychart.

## 2024-01-08 NOTE — Telephone Encounter (Signed)
 Patient walked into clinic asking for work note.  Provider wrote note and it was given to patient.

## 2024-01-12 ENCOUNTER — Telehealth: Payer: Self-pay

## 2024-01-12 NOTE — Progress Notes (Signed)
 Care Guide Pharmacy Note  01/12/2024 Name: Jack Porter MRN: [id number] DOB: 1980-10-23  Referred By: Arrie Senate, FNP Reason for referral: Complex Care Management (Outreach to schedule with Pharm d )   Jack Porter is a 43 y.o. year old male who is a primary care patient of Ellamae Sia, Aleen Campi, FNP.  Jack Porter was referred to the pharmacist for assistance related to: DMII  Successful contact was made with the patient to discuss pharmacy services including being ready for the pharmacist to call at least 5 minutes before the scheduled appointment time and to have medication bottles and any blood pressure readings ready for review. The patient agreed to meet with the pharmacist via telephone visit on (date/time).02/13/2024  Penne Lash , RMA     [place]  [facility], [facility] Guide  Direct Dial: [phone]  Website: [email or web address]

## 2024-01-19 MED ORDER — VITAMIN D (ERGOCALCIFEROL) 1.25 MG (50000 UNIT) PO CAPS: 50000.0000 [IU] | ORAL_CAPSULE | ORAL | 0 refills | Status: DC

## 2024-01-19 NOTE — Addendum Note (Signed)
 Addended by: Jacqualyn Mates on: 01/19/2024 07:02 PM   Modules accepted: Orders

## 2024-01-19 NOTE — Progress Notes (Signed)
 Will have patient meet with Concha Deed for assistance with diabetes management. Given consistently elevated bilirubin, will refer to hepatology for further evaluation. Cholesterol has improved. The 10-year ASCVD risk score (Arnett DK, et al., 2019) is: 2.9%. continue crestor. Vitamin D level is low. I have sent in a weekly supplement to take for the next 12 weeks. After that, take a daily OTC vitamin D supplement with 1000-2000 IU.

## 2024-01-28 ENCOUNTER — Ambulatory Visit: Admitting: Family Medicine

## 2024-01-28 ENCOUNTER — Encounter: Payer: Self-pay | Admitting: Family Medicine

## 2024-01-28 VITALS — BP 125/82 | HR 117 | Temp 97.7°F | Ht 73.0 in | Wt 278.0 lb

## 2024-01-28 DIAGNOSIS — L0291 Cutaneous abscess, unspecified: Secondary | ICD-10-CM

## 2024-01-28 MED ORDER — DOXYCYCLINE HYCLATE 100 MG PO TABS
100.0000 mg | ORAL_TABLET | Freq: Two times a day (BID) | ORAL | 0 refills | Status: AC
Start: 2024-01-28 — End: 2024-02-07

## 2024-01-28 NOTE — Progress Notes (Signed)
 Subjective:  Patient ID: Jack Porter, male    DOB: September 30, 1981, 43 y.o.   MRN: [id number]  Patient Care Team: Chrystine Crate, FNP as PCP - General (Family Medicine) Alexia Idler, OD (Optometry)   Chief Complaint:  Recurrent Skin Infections (Patient states that a boil popped yesterday on his bottom.  States it is draining. )   HPI: Jack Porter is a 43 y.o. male presenting on 01/28/2024 for Recurrent Skin Infections (Patient states that a boil popped yesterday on his bottom.  States it is draining. )   History of Present Illness   Jack Porter is a 43 year old male with diabetes who presents with a boil that popped yesterday.  He has a boil located on the crease of his side that popped yesterday, leading to fatigue, weakness, and loss of appetite. He noticed a discharge in his underwear, describing it as 'nasty'.  He has not undertaken any specific treatment for the boil, using only warm compresses as management.   He confirms that his blood sugars are fine as checked by the nurse. No issues with blood sugar levels.          Relevant past medical, surgical, family, and social history reviewed and updated as indicated.  Allergies and medications reviewed and updated. Data reviewed: Chart in Epic.   Past Medical History:  Diagnosis Date   GERD (gastroesophageal reflux disease)    H/O hiatal hernia    Type 2 diabetes mellitus (HCC)     Past Surgical History:  Procedure Laterality Date   CHOLECYSTECTOMY N/A 01/21/2014   Procedure: LAPAROSCOPIC CHOLECYSTECTOMY;  Surgeon: Fran Imus, MD;  Location: [facility];  Service: General;  Laterality: N/A;    Social History   Socioeconomic History   Marital status: Married    Spouse name: Not on file   Number of children: Not on file   Years of education: Not on file   Highest education level: 12th grade  Occupational History   Not on file  Tobacco Use   Smoking status: Former    Current packs/day: 0.25     Average packs/day: 0.3 packs/day for 3.0 years (0.8 ttl pk-yrs)    Types: Cigarettes   Smokeless tobacco: Never  Vaping Use   Vaping status: Never Used  Substance and Sexual Activity   Alcohol use: No    Comment: rarely   Drug use: No   Sexual activity: Yes  Other Topics Concern   Not on file  Social History Narrative   Not on file   Social Drivers of Health   Financial Resource Strain: Low Risk  (09/22/2023)   Overall Financial Resource Strain (CARDIA)    Difficulty of Paying Living Expenses: Not very hard  Food Insecurity: No Food Insecurity (01/02/2024)   Received from [facility]   Hunger Vital Sign    Worried About Running Out of Food in the Last Year: Never true    Ran Out of Food in the Last Year: Never true  Transportation Needs: No Transportation Needs (01/02/2024)   Received from [facility]   PRAPARE - Transportation    Lack of Transportation (Medical): No    Lack of Transportation (Non-Medical): No  Physical Activity: Insufficiently Active (09/22/2023)   Exercise Vital Sign    Days of Exercise per Week: 3 days    Minutes of Exercise per Session: 20 min  Stress: No Stress Concern Present (09/22/2023)   [employer] of Occupational Health - Occupational Stress  Questionnaire    Feeling of Stress : Not at all  Social Connections: Moderately Integrated (09/22/2023)   Social Connection and Isolation Panel [NHANES]    Frequency of Communication with Friends and Family: Once a week    Frequency of Social Gatherings with Friends and Family: Three times a week    Attends Religious Services: 1 to 4 times per year    Active Member of Clubs or Organizations: No    Attends Banker Meetings: Not on file    Marital Status: Married  Catering manager Violence: Not At Risk (01/07/2024)   Humiliation, Afraid, Rape, and Kick questionnaire    Fear of Current or Ex-Partner: No    Emotionally Abused: No    Physically Abused: No    Sexually Abused: No     Outpatient Encounter Medications as of 01/28/2024  Medication Sig   blood glucose meter kit and supplies KIT Dispense based on patient and insurance preference. Use up to four times daily as directed. (FOR ICD-9 250.00, 250.01).   dapagliflozin  propanediol (FARXIGA ) 10 MG TABS tablet Take 1 tablet (10 mg total) by mouth daily before breakfast.   doxycycline  (VIBRA -TABS) 100 MG tablet Take 1 tablet (100 mg total) by mouth 2 (two) times daily for 10 days. 1 po bid   glipiZIDE  (GLUCOTROL ) 10 MG tablet Take 1 tablet (10 mg total) by mouth daily before breakfast.   glucose blood (IGLUCOSE TEST STRIPS) test strip Use as instructed   losartan  (COZAAR ) 25 MG tablet Take 1 tablet by mouth once daily   rosuvastatin  (CRESTOR ) 5 MG tablet Take 1 tablet (5 mg total) by mouth daily.   tirzepatide  (MOUNJARO ) 15 MG/0.5ML Pen Inject 15 mg into the skin once a week.   Vitamin D , Ergocalciferol , (DRISDOL ) 1.25 MG (50000 UNIT) CAPS capsule Take 1 capsule (50,000 Units total) by mouth every 7 (seven) days.   metFORMIN  (GLUCOPHAGE ) 1000 MG tablet Take 1 tablet (1,000 mg total) by mouth 2 (two) times daily with a meal.   [DISCONTINUED] ofloxacin  (OCUFLOX ) 0.3 % ophthalmic solution Place 1 drop into both eyes 4 (four) times daily.   [DISCONTINUED] predniSONE  (DELTASONE ) 10 MG tablet Take 30 mg by mouth daily.   No facility-administered encounter medications on file as of 01/28/2024.    No Known Allergies  Pertinent ROS per HPI, otherwise unremarkable      Objective:  BP 125/82   Pulse (!) 117   Temp 97.7 F (36.5 C)   Ht 6\' 1"  (1.854 m)   Wt 278 lb (126.1 kg)   SpO2 98%   BMI 36.68 kg/m    Wt Readings from Last 3 Encounters:  01/28/24 278 lb (126.1 kg)  01/07/24 287 lb (130.2 kg)  11/20/23 291 lb (132 kg)    Physical Exam Vitals and nursing note reviewed.  Constitutional:      General: He is not in acute distress.    Appearance: Normal appearance. He is obese. He is not ill-appearing,  toxic-appearing or diaphoretic.  HENT:     Head: Normocephalic and atraumatic.     Nose: Nose normal.     Mouth/Throat:     Mouth: Mucous membranes are moist.  Cardiovascular:     Rate and Rhythm: Normal rate and regular rhythm.     Heart sounds: Normal heart sounds.  Pulmonary:     Effort: Pulmonary effort is normal.     Breath sounds: Normal breath sounds.  Musculoskeletal:     Right lower leg: No edema.  Left lower leg: No edema.  Skin:    General: Skin is warm and dry.     Capillary Refill: Capillary refill takes less than 2 seconds.     Findings: Abscess and erythema present.       Neurological:     General: No focal deficit present.     Mental Status: He is alert and oriented to person, place, and time.  Psychiatric:        Mood and Affect: Mood normal.        Behavior: Behavior normal.        Thought Content: Thought content normal.        Judgment: Judgment normal.       Results for orders placed or performed in visit on 01/07/24  Bayer DCA Hb A1c Waived   Collection Time: 01/07/24  1:19 PM  Result Value Ref Range   HB A1C (BAYER DCA - WAIVED) 8.2 (H) 4.8 - 5.6 %  CBC with Differential/Platelet   Collection Time: 01/07/24  1:23 PM  Result Value Ref Range   WBC 4.4 3.4 - 10.8 x10E3/uL   RBC 5.22 4.14 - 5.80 x10E6/uL   Hemoglobin 15.9 13.0 - 17.7 g/dL   Hematocrit 16.1 09.6 - 51.0 %   MCV 89 79 - 97 fL   MCH 30.5 26.6 - 33.0 pg   MCHC 34.2 31.5 - 35.7 g/dL   RDW 04.5 40.9 - 81.1 %   Platelets 152 150 - 450 x10E3/uL   Neutrophils 54 Not Estab. %   Lymphs 35 Not Estab. %   Monocytes 8 Not Estab. %   Eos 2 Not Estab. %   Basos 1 Not Estab. %   Neutrophils Absolute 2.4 1.4 - 7.0 x10E3/uL   Lymphocytes Absolute 1.5 0.7 - 3.1 x10E3/uL   Monocytes Absolute 0.3 0.1 - 0.9 x10E3/uL   EOS (ABSOLUTE) 0.1 0.0 - 0.4 x10E3/uL   Basophils Absolute 0.0 0.0 - 0.2 x10E3/uL   Immature Granulocytes 0 Not Estab. %   Immature Grans (Abs) 0.0 0.0 - 0.1 x10E3/uL   CMP14+EGFR   Collection Time: 01/07/24  1:23 PM  Result Value Ref Range   Glucose 216 (H) 70 - 99 mg/dL   BUN 10 6 - 24 mg/dL   Creatinine, Ser 9.14 (L) 0.76 - 1.27 mg/dL   eGFR 782 >95 AO/ZHY/8.65   BUN/Creatinine Ratio 17 9 - 20   Sodium 140 134 - 144 mmol/L   Potassium 4.4 3.5 - 5.2 mmol/L   Chloride 102 96 - 106 mmol/L   CO2 26 20 - 29 mmol/L   Calcium  9.4 8.7 - 10.2 mg/dL   Total Protein 7.0 6.0 - 8.5 g/dL   Albumin 4.1 4.1 - 5.1 g/dL   Globulin, Total 2.9 1.5 - 4.5 g/dL   Bilirubin Total 1.6 (H) 0.0 - 1.2 mg/dL   Alkaline Phosphatase 101 44 - 121 IU/L   AST 42 (H) 0 - 40 IU/L   ALT 36 0 - 44 IU/L  Lipid panel   Collection Time: 01/07/24  1:23 PM  Result Value Ref Range   Cholesterol, Total 136 100 - 199 mg/dL   Triglycerides 784 0 - 149 mg/dL   HDL 29 (L) >69 mg/dL   VLDL Cholesterol Cal 20 5 - 40 mg/dL   LDL Chol Calc (NIH) 87 0 - 99 mg/dL   Chol/HDL Ratio 4.7 0.0 - 5.0 ratio  VITAMIN D  25 Hydroxy (Vit-D Deficiency, Fractures)   Collection Time: 01/07/24  1:23 PM  Result Value  Ref Range   Vit D, 25-Hydroxy 15.3 (L) 30.0 - 100.0 ng/mL  Vitamin B12   Collection Time: 01/07/24  1:23 PM  Result Value Ref Range   Vitamin B-12 345 232 - 1,245 pg/mL       Pertinent labs & imaging results that were available during my care of the patient were reviewed by me and considered in my medical decision making.  Assessment & Plan:  Khalik was seen today for recurrent skin infections.  Diagnoses and all orders for this visit:  Abscess -     doxycycline  (VIBRA -TABS) 100 MG tablet; Take 1 tablet (100 mg total) by mouth 2 (two) times daily for 10 days. 1 po bid     Assessment and Plan    Cellulitis Cellulitis around a boil in the groin crease. The boil ruptured yesterday, leading to fatigue, weakness, and anorexia. The area remains indurated and unsuitable for incision and drainage. The cellulitis likely contributes to systemic symptoms. - Prescribe doxycycline  for 10  days - Advise warm compresses and sitz baths with warm soapy water twice daily, followed by rinsing with clean water - Instruct to keep the area covered with a Band-Aid - Send prescription to [facility] pharmacy - Follow up in two weeks for wound recheck - Encourage adequate hydration  Diabetes mellitus Increased risk for infections due to diabetes. Blood glucose levels reported as well-controlled.          Continue all other maintenance medications.  Follow up plan: Return in about 2 weeks (around 02/11/2024), or if symptoms worsen or fail to improve, for Wound Recheck.   Continue healthy lifestyle choices, including diet (rich in fruits, vegetables, and lean proteins, and low in salt and simple carbohydrates) and exercise (at least 30 minutes of moderate physical activity daily).  Educational handout given for abscess  The above assessment and management plan was discussed with the patient. The patient verbalized understanding of and has agreed to the management plan. Patient is aware to call the clinic if they develop any new symptoms or if symptoms persist or worsen. Patient is aware when to return to the clinic for a follow-up visit. Patient educated on when it is appropriate to go to the emergency department.   Kattie Parrot, FNP-C [facility] [phone]

## 2024-02-03 ENCOUNTER — Encounter: Payer: Self-pay | Admitting: *Deleted

## 2024-02-06 ENCOUNTER — Other Ambulatory Visit: Payer: Self-pay | Admitting: Family Medicine

## 2024-02-06 DIAGNOSIS — E119 Type 2 diabetes mellitus without complications: Secondary | ICD-10-CM

## 2024-02-11 ENCOUNTER — Ambulatory Visit: Admitting: Family Medicine

## 2024-02-12 ENCOUNTER — Encounter: Payer: Self-pay | Admitting: Family Medicine

## 2024-02-13 ENCOUNTER — Other Ambulatory Visit

## 2024-02-25 ENCOUNTER — Other Ambulatory Visit (INDEPENDENT_AMBULATORY_CARE_PROVIDER_SITE_OTHER): Admitting: Pharmacist

## 2024-02-25 DIAGNOSIS — Z7985 Long-term (current) use of injectable non-insulin antidiabetic drugs: Secondary | ICD-10-CM

## 2024-02-25 DIAGNOSIS — E119 Type 2 diabetes mellitus without complications: Secondary | ICD-10-CM

## 2024-02-25 DIAGNOSIS — Z7984 Long term (current) use of oral hypoglycemic drugs: Secondary | ICD-10-CM

## 2024-02-25 MED ORDER — OZEMPIC (0.25 OR 0.5 MG/DOSE) 2 MG/3ML ~~LOC~~ SOPN: 0.2500 mg | PEN_INJECTOR | SUBCUTANEOUS | 5 refills | Status: DC

## 2024-02-25 MED ORDER — FREESTYLE LIBRE 3 PLUS SENSOR MISC: 5 refills | Status: DC

## 2024-02-25 NOTE — Progress Notes (Signed)
 02/25/2024 Name: Jack Porter MRN: [id number] DOB: 12/15/1980  Chief Complaint  Patient presents with   Diabetes    Jack Porter is a 43 y.o. year old male who presented for a telephone visit.   They were referred to the pharmacist by their PCP for assistance in managing diabetes.    Subjective:  Patient reports he is doing well and is making multiple lifestyle changes to aid in his diabetes management.  He has stopped Mounjaro  due to GI distress.    Care Team: Primary Care Provider: Chrystine Crate, FNP    Medication Access/Adherence  Current Pharmacy:  [facility] [address], [place] - [address] [place] [address] Phone: [phone] Fax: [phone]   Patient reports affordability concerns with their medications: No  Patient reports access/transportation concerns to their pharmacy: No  Patient reports adherence concerns with their medications:  Yes  due to GI symptoms   Diabetes:  Current medications:  Medications tried in the past: ozempic ,  Current glucose readings: FBG<150 Interested in CGM  Current meal patterns:  Discussed meal planning options and Plate method for healthy eating Avoid sugary drinks and desserts Incorporate balanced protein, non starchy veggies, 1 serving of carbohydrate with each meal Increase water intake Increase physical activity as able  - Snacks --fruits (pears, banana (limit) - enjoys protein-burgers, chicken, [place] - working to incorporate - Drinks cut sodas, trying to cut juices  Current physical activity: more walking at work (walking about 10 hrs.day), bike, sometimes treadmill  Current medication access support: BCBS  Objective:  Lab Results  Component Value Date   HGBA1C 8.2 (H) 01/07/2024    Lab Results  Component Value Date   CREATININE 0.59 (L) 01/07/2024   BUN 10 01/07/2024   NA 140 01/07/2024   K 4.4 01/07/2024   CL 102 01/07/2024   CO2 26 01/07/2024     Lab Results  Component Value Date   CHOL 136 01/07/2024   HDL 29 (L) 01/07/2024   LDLCALC 87 01/07/2024   TRIG 106 01/07/2024   CHOLHDL 4.7 01/07/2024    Medications Reviewed Today     Reviewed by Delilah Fend, [facility] (Pharmacist) on 02/25/24 at 1315  Med List Status: <None>   Medication Order Taking? Sig Documenting Provider Last Dose Status Informant  blood glucose meter kit and supplies KIT [id number] No Dispense based on patient and insurance preference. Use up to four times daily as directed. (FOR ICD-9 250.00, 250.01). Leoma Raja, MD Taking Active   dapagliflozin  propanediol (FARXIGA ) 10 MG TABS tablet [id number] No Take 1 tablet (10 mg total) by mouth daily before breakfast. Milian, Winda Hastings, FNP Taking Active   glipiZIDE  (GLUCOTROL ) 10 MG tablet [id number] No Take 1 tablet (10 mg total) by mouth daily before breakfast. Rosalynn Come Winda Hastings, FNP Taking Active   glucose blood (IGLUCOSE TEST STRIPS) test strip [id number] No Use as instructed Albertha Huger, FNP Taking Active   losartan  (COZAAR ) 25 MG tablet [id number] No Take 1 tablet by mouth once daily Chrystine Crate, FNP Taking Active   metFORMIN  (GLUCOPHAGE ) 1000 MG tablet [id number]  TAKE 1 TABLET BY MOUTH TWICE DAILY WITH A MEAL Milian, Winda Hastings, FNP  Active   rosuvastatin  (CRESTOR ) 5 MG tablet [id number] No Take 1 tablet (5 mg total) by mouth daily. Chrystine Crate, FNP Taking Active   tirzepatide  (MOUNJARO ) 15 MG/0.5ML Pen [id number] No Inject 15 mg into the skin once a week. Jacqualyn Mates  Capps, FNP Taking Active   Vitamin D , Ergocalciferol , (DRISDOL ) 1.25 MG (50000 UNIT) CAPS capsule [id number] No Take 1 capsule (50,000 Units total) by mouth every 7 (seven) days. Chrystine Crate, FNP Taking Active             Assessment/Plan:   Diabetes: - Currently uncontrolled--patient is working to improve - Reviewed long term cardiovascular and renal outcomes of uncontrolled  blood sugar - Reviewed goal A1c, goal fasting, and goal 2 hour post prandial glucose - Reviewed dietary modifications including FOLLOWING A HEART HEALTHY DIET/HEALTHY PLATE METHOD - Reviewed lifestyle modifications including: increased physical activity - Recommend to : Restart Ozempic  0.25mg  weekly (titrate slow, start low)--patient has stopped Mounjaro  Continue all other medications as prescribed for now Will leave FSL3+ CGM sensors up front for patient to try--unsure if insurance will cover due to no insulin   - Patient denies personal or family history of multiple endocrine neoplasia type 2, medullary thyroid  cancer; personal history of pancreatitis or gallbladder disease. - Recommend to check glucose daily (fasting) or if symptomatic  Follow Up Plan: 1 month   Marvell Slider, PharmD, BCACP, CPP Clinical Pharmacist, [facility]

## 2024-03-03 ENCOUNTER — Ambulatory Visit: Admitting: Family Medicine

## 2024-03-03 ENCOUNTER — Encounter: Payer: Self-pay | Admitting: Family Medicine

## 2024-03-03 VITALS — BP 129/79 | HR 110 | Temp 98.0°F | Ht 73.0 in | Wt 285.2 lb

## 2024-03-03 DIAGNOSIS — Z7984 Long term (current) use of oral hypoglycemic drugs: Secondary | ICD-10-CM | POA: Diagnosis not present

## 2024-03-03 DIAGNOSIS — M79671 Pain in right foot: Secondary | ICD-10-CM

## 2024-03-03 DIAGNOSIS — M79672 Pain in left foot: Secondary | ICD-10-CM | POA: Diagnosis not present

## 2024-03-03 DIAGNOSIS — E119 Type 2 diabetes mellitus without complications: Secondary | ICD-10-CM

## 2024-03-03 MED ORDER — NAPROXEN 500 MG PO TABS: 500.0000 mg | ORAL_TABLET | Freq: Two times a day (BID) | ORAL | 0 refills | Status: DC

## 2024-03-03 MED ORDER — DAPAGLIFLOZIN PROPANEDIOL 10 MG PO TABS
10.0000 mg | ORAL_TABLET | Freq: Every day | ORAL | 1 refills | Status: AC
Start: 2024-03-03 — End: ?

## 2024-03-03 NOTE — Progress Notes (Signed)
 Subjective:  Patient ID: Jack Porter, male    DOB: 26-Oct-1980, 43 y.o.   MRN: [id number]  Patient Care Team: Chrystine Crate, FNP as PCP - General (Family Medicine) Alexia Idler, OD (Optometry)   Chief Complaint:  Foot Pain (Feels a shooting pain in both feet while walking, been going on for about a week. Feels like pins and needles  )   HPI: Jack Porter is a 43 y.o. male presenting on 03/03/2024 for Foot Pain (Feels a shooting pain in both feet while walking, been going on for about a week. Feels like pins and needles  )  Foot Pain   States that feet have been feeling like pins and needles. States that he has had similar symptoms in the past and was treated for plantar fasciitis. States that this most recent flare started 1.5 weeks ago. Treated with massage with rolling pin and ice. States that it is sometimes worse in the mornings or after rest. Reports that it is especially worse at work because he is walking constantly and can feel it more. He has bought new shoes, but has not tried insoles. In addition, he needs refill of farxiga .   Relevant past medical, surgical, family, and social history reviewed and updated as indicated.  Allergies and medications reviewed and updated. Data reviewed: Chart in Epic.   Past Medical History:  Diagnosis Date   GERD (gastroesophageal reflux disease)    H/O hiatal hernia    Type 2 diabetes mellitus (HCC)    Past Surgical History:  Procedure Laterality Date   CHOLECYSTECTOMY N/A 01/21/2014   Procedure: LAPAROSCOPIC CHOLECYSTECTOMY;  Surgeon: Fran Imus, MD;  Location: [facility];  Service: General;  Laterality: N/A;   Social History   Socioeconomic History   Marital status: Married    Spouse name: Not on file   Number of children: Not on file   Years of education: Not on file   Highest education level: 12th grade  Occupational History   Not on file  Tobacco Use   Smoking status: Former    Current packs/day: 0.25     Average packs/day: 0.3 packs/day for 3.0 years (0.8 ttl pk-yrs)    Types: Cigarettes   Smokeless tobacco: Never  Vaping Use   Vaping status: Never Used  Substance and Sexual Activity   Alcohol use: No    Comment: rarely   Drug use: No   Sexual activity: Yes  Other Topics Concern   Not on file  Social History Narrative   Not on file   Social Drivers of Health   Financial Resource Strain: Low Risk  (09/22/2023)   Overall Financial Resource Strain (CARDIA)    Difficulty of Paying Living Expenses: Not very hard  Food Insecurity: No Food Insecurity (01/02/2024)   Received from [facility]   Hunger Vital Sign    Worried About Running Out of Food in the Last Year: Never true    Ran Out of Food in the Last Year: Never true  Transportation Needs: No Transportation Needs (01/02/2024)   Received from [facility]   PRAPARE - Transportation    Lack of Transportation (Medical): No    Lack of Transportation (Non-Medical): No  Physical Activity: Insufficiently Active (09/22/2023)   Exercise Vital Sign    Days of Exercise per Week: 3 days    Minutes of Exercise per Session: 20 min  Stress: No Stress Concern Present (09/22/2023)   [employer] of Occupational Health -  Occupational Stress Questionnaire    Feeling of Stress : Not at all  Social Connections: Moderately Integrated (09/22/2023)   Social Connection and Isolation Panel [NHANES]    Frequency of Communication with Friends and Family: Once a week    Frequency of Social Gatherings with Friends and Family: Three times a week    Attends Religious Services: 1 to 4 times per year    Active Member of Clubs or Organizations: No    Attends Banker Meetings: Not on file    Marital Status: Married  Catering manager Violence: Not At Risk (01/07/2024)   Humiliation, Afraid, Rape, and Kick questionnaire    Fear of Current or Ex-Partner: No    Emotionally Abused: No    Physically Abused: No    Sexually Abused: No     Outpatient Encounter Medications as of 03/03/2024  Medication Sig   blood glucose meter kit and supplies KIT Dispense based on patient and insurance preference. Use up to four times daily as directed. (FOR ICD-9 250.00, 250.01).   Continuous Glucose Sensor (FREESTYLE LIBRE 3 PLUS SENSOR) MISC Change sensor every 15 days. Apply to back of upper arm; DX: E11.65   dapagliflozin  propanediol (FARXIGA ) 10 MG TABS tablet Take 1 tablet (10 mg total) by mouth daily before breakfast.   glipiZIDE  (GLUCOTROL ) 10 MG tablet Take 1 tablet (10 mg total) by mouth daily before breakfast.   glucose blood (IGLUCOSE TEST STRIPS) test strip Use as instructed   losartan  (COZAAR ) 25 MG tablet Take 1 tablet by mouth once daily   metFORMIN  (GLUCOPHAGE ) 1000 MG tablet TAKE 1 TABLET BY MOUTH TWICE DAILY WITH A MEAL   rosuvastatin  (CRESTOR ) 5 MG tablet Take 1 tablet (5 mg total) by mouth daily.   Semaglutide ,0.25 or 0.5MG /DOS, (OZEMPIC , 0.25 OR 0.5 MG/DOSE,) 2 MG/3ML SOPN Inject 0.25 mg into the skin once a week.   Vitamin D , Ergocalciferol , (DRISDOL ) 1.25 MG (50000 UNIT) CAPS capsule Take 1 capsule (50,000 Units total) by mouth every 7 (seven) days.   No facility-administered encounter medications on file as of 03/03/2024.    No Known Allergies  Review of Systems As per HPI  Objective:  BP 129/79   Pulse (!) 110   Temp 98 F (36.7 C)   Ht 6\' 1"  (1.854 m)   Wt 285 lb 3.2 oz (129.4 kg)   SpO2 97%   BMI 37.63 kg/m    Wt Readings from Last 3 Encounters:  03/03/24 285 lb 3.2 oz (129.4 kg)  01/28/24 278 lb (126.1 kg)  01/07/24 287 lb (130.2 kg)   Physical Exam Constitutional:      General: He is awake. He is not in acute distress.    Appearance: Normal appearance. He is well-developed and well-groomed. He is obese. He is not ill-appearing, toxic-appearing or diaphoretic.  Cardiovascular:     Rate and Rhythm: Regular rhythm. Tachycardia present.     Heart sounds: Normal heart sounds.  Pulmonary:      Effort: Pulmonary effort is normal.     Breath sounds: Normal breath sounds.  Musculoskeletal:     Right foot: Normal range of motion and normal capillary refill. Tenderness present. No swelling, deformity, bunion, Charcot foot, foot drop, prominent metatarsal heads, laceration, bony tenderness or crepitus. Normal pulse.     Left foot: Normal range of motion and normal capillary refill. Tenderness present. No swelling, deformity, bunion, Charcot foot, foot drop, prominent metatarsal heads, laceration, bony tenderness or crepitus. Normal pulse.     Comments: Bilateral  heel tenderness  Bilateral tenderness at base of heel   Neurological:     Mental Status: He is alert and easily aroused.  Psychiatric:        Behavior: Behavior is cooperative.     Results for orders placed or performed in visit on 01/07/24  Bayer DCA Hb A1c Waived   Collection Time: 01/07/24  1:19 PM  Result Value Ref Range   HB A1C (BAYER DCA - WAIVED) 8.2 (H) 4.8 - 5.6 %  CBC with Differential/Platelet   Collection Time: 01/07/24  1:23 PM  Result Value Ref Range   WBC 4.4 3.4 - 10.8 x10E3/uL   RBC 5.22 4.14 - 5.80 x10E6/uL   Hemoglobin 15.9 13.0 - 17.7 g/dL   Hematocrit 16.1 09.6 - 51.0 %   MCV 89 79 - 97 fL   MCH 30.5 26.6 - 33.0 pg   MCHC 34.2 31.5 - 35.7 g/dL   RDW 04.5 40.9 - 81.1 %   Platelets 152 150 - 450 x10E3/uL   Neutrophils 54 Not Estab. %   Lymphs 35 Not Estab. %   Monocytes 8 Not Estab. %   Eos 2 Not Estab. %   Basos 1 Not Estab. %   Neutrophils Absolute 2.4 1.4 - 7.0 x10E3/uL   Lymphocytes Absolute 1.5 0.7 - 3.1 x10E3/uL   Monocytes Absolute 0.3 0.1 - 0.9 x10E3/uL   EOS (ABSOLUTE) 0.1 0.0 - 0.4 x10E3/uL   Basophils Absolute 0.0 0.0 - 0.2 x10E3/uL   Immature Granulocytes 0 Not Estab. %   Immature Grans (Abs) 0.0 0.0 - 0.1 x10E3/uL  CMP14+EGFR   Collection Time: 01/07/24  1:23 PM  Result Value Ref Range   Glucose 216 (H) 70 - 99 mg/dL   BUN 10 6 - 24 mg/dL   Creatinine, Ser 9.14 (L) 0.76 -  1.27 mg/dL   eGFR 782 >95 AO/ZHY/8.65   BUN/Creatinine Ratio 17 9 - 20   Sodium 140 134 - 144 mmol/L   Potassium 4.4 3.5 - 5.2 mmol/L   Chloride 102 96 - 106 mmol/L   CO2 26 20 - 29 mmol/L   Calcium  9.4 8.7 - 10.2 mg/dL   Total Protein 7.0 6.0 - 8.5 g/dL   Albumin 4.1 4.1 - 5.1 g/dL   Globulin, Total 2.9 1.5 - 4.5 g/dL   Bilirubin Total 1.6 (H) 0.0 - 1.2 mg/dL   Alkaline Phosphatase 101 44 - 121 IU/L   AST 42 (H) 0 - 40 IU/L   ALT 36 0 - 44 IU/L  Lipid panel   Collection Time: 01/07/24  1:23 PM  Result Value Ref Range   Cholesterol, Total 136 100 - 199 mg/dL   Triglycerides 784 0 - 149 mg/dL   HDL 29 (L) >69 mg/dL   VLDL Cholesterol Cal 20 5 - 40 mg/dL   LDL Chol Calc (NIH) 87 0 - 99 mg/dL   Chol/HDL Ratio 4.7 0.0 - 5.0 ratio  VITAMIN D  25 Hydroxy (Vit-D Deficiency, Fractures)   Collection Time: 01/07/24  1:23 PM  Result Value Ref Range   Vit D, 25-Hydroxy 15.3 (L) 30.0 - 100.0 ng/mL  Vitamin B12   Collection Time: 01/07/24  1:23 PM  Result Value Ref Range   Vitamin B-12 345 232 - 1,245 pg/mL       01/07/2024    1:17 PM 11/20/2023    2:24 PM 09/23/2023    3:40 PM 06/12/2023    3:54 PM 03/12/2023    3:17 PM  Depression screen PHQ 2/9  Decreased  Interest 0 0 0 0 0  Down, Depressed, Hopeless 0 0 0 0 0  PHQ - 2 Score 0 0 0 0 0  Altered sleeping   0 0   Tired, decreased energy   0 0   Change in appetite   0 0   Feeling bad or failure about yourself    0 0   Trouble concentrating   0 0   Moving slowly or fidgety/restless   0 0   Suicidal thoughts   0 0   PHQ-9 Score   0 0   Difficult doing work/chores   Not difficult at all Not difficult at all        01/07/2024    1:17 PM 11/20/2023    2:24 PM 09/23/2023    3:40 PM 06/12/2023    4:04 PM  GAD 7 : Generalized Anxiety Score  Nervous, Anxious, on Edge 0 0 0 0  Control/stop worrying 0 0 0 0  Worry too much - different things 0 0 0 0  Trouble relaxing 0 0 0 0  Restless 0 0 0 0  Easily annoyed or irritable 0 0 0 0  Afraid  - awful might happen 0 0 0 0  Total GAD 7 Score 0 0 0 0  Anxiety Difficulty Not difficult at all Not difficult at all Not difficult at all Not difficult at all   Pertinent labs & imaging results that were available during my care of the patient were reviewed by me and considered in my medical decision making.  Assessment & Plan:  Jack Porter was seen today for foot pain.  Diagnoses and all orders for this visit:  1. Bilateral foot pain (Primary) Will start medication as below. Will not do steroid as patient has diabetes not at goal. GFR within normal range. Patient is interested in follow up with podiatry as he has had multiple flares. Discussed with patient stretching and using ice.  - naproxen  (NAPROSYN ) 500 MG tablet; Take 1 tablet (500 mg total) by mouth 2 (two) times daily with a meal.  Dispense: 30 tablet; Refill: 0 - Ambulatory referral to Podiatry  2. Type 2 diabetes mellitus without complication, without long-term current use of insulin  (HCC) Refill provided. Not using prednisone  for above due to uncontrolled diabetes  - dapagliflozin  propanediol (FARXIGA ) 10 MG TABS tablet; Take 1 tablet (10 mg total) by mouth daily before breakfast.  Dispense: 30 tablet; Refill: 1  3. Diabetes mellitus treated with oral medication (HCC) As above.  - dapagliflozin  propanediol (FARXIGA ) 10 MG TABS tablet; Take 1 tablet (10 mg total) by mouth daily before breakfast.  Dispense: 30 tablet; Refill: 1   Continue all other maintenance medications.  Follow up plan: Return if symptoms worsen or fail to improve.  Continue healthy lifestyle choices, including diet (rich in fruits, vegetables, and lean proteins, and low in salt and simple carbohydrates) and exercise (at least 30 minutes of moderate physical activity daily).  Written and verbal instructions provided   The above assessment and management plan was discussed with the patient. The patient verbalized understanding of and has agreed to the  management plan. Patient is aware to call the clinic if they develop any new symptoms or if symptoms persist or worsen. Patient is aware when to return to the clinic for a follow-up visit. Patient educated on when it is appropriate to go to the emergency department.   Jacqualyn Mates, DNP-FNP [facility] [address] [place], [place] [address] [phone]

## 2024-03-09 ENCOUNTER — Other Ambulatory Visit: Payer: Self-pay | Admitting: Family Medicine

## 2024-03-09 DIAGNOSIS — E119 Type 2 diabetes mellitus without complications: Secondary | ICD-10-CM

## 2024-03-22 DIAGNOSIS — R200 Anesthesia of skin: Secondary | ICD-10-CM | POA: Diagnosis not present

## 2024-03-22 DIAGNOSIS — R202 Paresthesia of skin: Secondary | ICD-10-CM | POA: Diagnosis not present

## 2024-03-22 DIAGNOSIS — M5412 Radiculopathy, cervical region: Secondary | ICD-10-CM | POA: Diagnosis not present

## 2024-04-05 ENCOUNTER — Ambulatory Visit (INDEPENDENT_AMBULATORY_CARE_PROVIDER_SITE_OTHER)

## 2024-04-05 ENCOUNTER — Encounter: Payer: Self-pay | Admitting: Podiatry

## 2024-04-05 ENCOUNTER — Ambulatory Visit: Admitting: Podiatry

## 2024-04-05 DIAGNOSIS — M62461 Contracture of muscle, right lower leg: Secondary | ICD-10-CM

## 2024-04-05 DIAGNOSIS — M7741 Metatarsalgia, right foot: Secondary | ICD-10-CM

## 2024-04-05 DIAGNOSIS — M62462 Contracture of muscle, left lower leg: Secondary | ICD-10-CM

## 2024-04-05 DIAGNOSIS — M722 Plantar fascial fibromatosis: Secondary | ICD-10-CM | POA: Diagnosis not present

## 2024-04-05 DIAGNOSIS — M7742 Metatarsalgia, left foot: Secondary | ICD-10-CM

## 2024-04-05 MED ORDER — MELOXICAM 15 MG PO TABS: 15.0000 mg | ORAL_TABLET | Freq: Every day | ORAL | 3 refills | Status: DC

## 2024-04-05 NOTE — Patient Instructions (Signed)
 VISIT SUMMARY: Today, you were seen for bilateral foot pain, which you described as a 'needle-like' sensation on the bottoms of your feet. You also mentioned bumps on both feet and that the pain worsens with standing and walking, especially on concrete surfaces. You have a history of diabetes, and your recent A1c level has improved from 13 to around 8. You have been managing your diabetes more actively recently. You completed a course of Naprosyn , which provided temporary relief, and you have tried changing shoes, which also helped temporarily.  YOUR PLAN: -PLANTAR FASCIITIS: Plantar fasciitis is inflammation of the thick band of tissue that runs across the bottom of your foot and connects your heel bone to your toes. This condition is likely worsened by heel spurs and standing on hard surfaces. We will start you on meloxicam once daily to reduce inflammation. You will also be referred to physical therapy for exercises to stretch and strengthen your calf muscles. If your symptoms do not improve, we may consider a cortisone injection. Additionally, you will be fitted for custom orthotics to provide better arch support.  -METATARSALGIA: Metatarsalgia is pain and inflammation in the ball of your foot. This is likely due to prolonged standing on hard surfaces and wearing shoes without adequate support. We will start you on meloxicam once daily to reduce inflammation. You will also be referred to physical therapy for exercises to stretch and strengthen your feet. Custom orthotics will be fitted to provide cushioning and support in the metatarsal area.  -DIABETES MELLITUS: Diabetes mellitus is a condition where your blood sugar levels are too high. Your recent A1c levels have improved from 13 to around 8, which is a positive change. We will continue to monitor your blood sugar levels closely, especially if you need medications like steroids that can affect glucose levels. We will avoid using steroids unless  absolutely necessary due to their potential impact on your blood sugar levels.  INSTRUCTIONS: Please follow up with physical therapy as referred for your foot exercises. Continue to monitor your blood sugar levels closely. If your foot pain does not improve with the current treatment plan, please contact our office for further evaluation.                      Contains text generated by [employer].         Plantar Fasciitis (Heel Spur Syndrome) with Rehab The plantar fascia is a fibrous, ligament-like, soft-tissue structure that spans the bottom of the foot. Plantar fasciitis is a condition that causes pain in the foot due to inflammation of the tissue. SYMPTOMS  Pain and tenderness on the underneath side of the foot. Pain that worsens with standing or walking. CAUSES  Plantar fasciitis is caused by irritation and injury to the plantar fascia on the underneath side of the foot. Common mechanisms of injury include: Direct trauma to bottom of the foot. Damage to a small nerve that runs under the foot where the main fascia attaches to the heel bone. Stress placed on the plantar fascia due to bone spurs. RISK INCREASES WITH:  Activities that place stress on the plantar fascia (running, jumping, pivoting, or cutting). Poor strength and flexibility. Improperly fitted shoes. Tight calf muscles. Flat feet. Failure to warm-up properly before activity. Obesity. PREVENTION Warm up and stretch properly before activity. Allow for adequate recovery between workouts. Maintain physical fitness: Strength, flexibility, and endurance. Cardiovascular fitness. Maintain a health body weight. Avoid stress on the plantar fascia. Wear properly fitted shoes,  including arch supports for individuals who have flat feet.  PROGNOSIS  If treated properly, then the symptoms of plantar fasciitis usually resolve without surgery. However, occasionally surgery is necessary.  RELATED  COMPLICATIONS  Recurrent symptoms that may result in a chronic condition. Problems of the lower back that are caused by compensating for the injury, such as limping. Pain or weakness of the foot during push-off following surgery. Chronic inflammation, scarring, and partial or complete fascia tear, occurring more often from repeated injections.  TREATMENT  Treatment initially involves the use of ice and medication to help reduce pain and inflammation. The use of strengthening and stretching exercises may help reduce pain with activity, especially stretches of the Achilles tendon. These exercises may be performed at home or with a therapist. Your caregiver may recommend that you use heel cups of arch supports to help reduce stress on the plantar fascia. Occasionally, corticosteroid injections are given to reduce inflammation. If symptoms persist for greater than 6 months despite non-surgical (conservative), then surgery may be recommended.   MEDICATION  If pain medication is necessary, then nonsteroidal anti-inflammatory medications, such as aspirin and ibuprofen , or other minor pain relievers, such as acetaminophen , are often recommended. Do not take pain medication within 7 days before surgery. Prescription pain relievers may be given if deemed necessary by your caregiver. Use only as directed and only as much as you need. Corticosteroid injections may be given by your caregiver. These injections should be reserved for the most serious cases, because they may only be given a certain number of times.  HEAT AND COLD Cold treatment (icing) relieves pain and reduces inflammation. Cold treatment should be applied for 10 to 15 minutes every 2 to 3 hours for inflammation and pain and immediately after any activity that aggravates your symptoms. Use ice packs or massage the area with a piece of ice (ice massage). Heat treatment may be used prior to performing the stretching and strengthening activities  prescribed by your caregiver, physical therapist, or athletic trainer. Use a heat pack or soak the injury in warm water.  SEEK IMMEDIATE MEDICAL CARE IF: Treatment seems to offer no benefit, or the condition worsens. Any medications produce adverse side effects.  EXERCISES- RANGE OF MOTION (ROM) AND STRETCHING EXERCISES - Plantar Fasciitis (Heel Spur Syndrome) These exercises may help you when beginning to rehabilitate your injury. Your symptoms may resolve with or without further involvement from your physician, physical therapist or athletic trainer. While completing these exercises, remember:  Restoring tissue flexibility helps normal motion to return to the joints. This allows healthier, less painful movement and activity. An effective stretch should be held for at least 30 seconds. A stretch should never be painful. You should only feel a gentle lengthening or release in the stretched tissue.  RANGE OF MOTION - Toe Extension, Flexion Sit with your right / left leg crossed over your opposite knee. Grasp your toes and gently pull them back toward the top of your foot. You should feel a stretch on the bottom of your toes and/or foot. Hold this stretch for 10 seconds. Now, gently pull your toes toward the bottom of your foot. You should feel a stretch on the top of your toes and or foot. Hold this stretch for 10 seconds. Repeat  times. Complete this stretch 3 times per day.   RANGE OF MOTION - Ankle Dorsiflexion, Active Assisted Remove shoes and sit on a chair that is preferably not on a carpeted surface. Place right / left foot  under knee. Extend your opposite leg for support. Keeping your heel down, slide your right / left foot back toward the chair until you feel a stretch at your ankle or calf. If you do not feel a stretch, slide your bottom forward to the edge of the chair, while still keeping your heel down. Hold this stretch for 10 seconds. Repeat 3 times. Complete this stretch 2  times per day.   STRETCH  Gastroc, Standing Place hands on wall. Extend right / left leg, keeping the front knee somewhat bent. Slightly point your toes inward on your back foot. Keeping your right / left heel on the floor and your knee straight, shift your weight toward the wall, not allowing your back to arch. You should feel a gentle stretch in the right / left calf. Hold this position for 10 seconds. Repeat 3 times. Complete this stretch 2 times per day.  STRETCH  Soleus, Standing Place hands on wall. Extend right / left leg, keeping the other knee somewhat bent. Slightly point your toes inward on your back foot. Keep your right / left heel on the floor, bend your back knee, and slightly shift your weight over the back leg so that you feel a gentle stretch deep in your back calf. Hold this position for 10 seconds. Repeat 3 times. Complete this stretch 2 times per day.  STRETCH  Gastrocsoleus, Standing  Note: This exercise can place a lot of stress on your foot and ankle. Please complete this exercise only if specifically instructed by your caregiver.  Place the ball of your right / left foot on a step, keeping your other foot firmly on the same step. Hold on to the wall or a rail for balance. Slowly lift your other foot, allowing your body weight to press your heel down over the edge of the step. You should feel a stretch in your right / left calf. Hold this position for 10 seconds. Repeat this exercise with a slight bend in your right / left knee. Repeat 3 times. Complete this stretch 2 times per day.   STRENGTHENING EXERCISES - Plantar Fasciitis (Heel Spur Syndrome)  These exercises may help you when beginning to rehabilitate your injury. They may resolve your symptoms with or without further involvement from your physician, physical therapist or athletic trainer. While completing these exercises, remember:  Muscles can gain both the endurance and the strength needed for everyday  activities through controlled exercises. Complete these exercises as instructed by your physician, physical therapist or athletic trainer. Progress the resistance and repetitions only as guided.  STRENGTH - Towel Curls Sit in a chair positioned on a non-carpeted surface. Place your foot on a towel, keeping your heel on the floor. Pull the towel toward your heel by only curling your toes. Keep your heel on the floor. Repeat 3 times. Complete this exercise 2 times per day.  STRENGTH - Ankle Inversion Secure one end of a rubber exercise band/tubing to a fixed object (table, pole). Loop the other end around your foot just before your toes. Place your fists between your knees. This will focus your strengthening at your ankle. Slowly, pull your big toe up and in, making sure the band/tubing is positioned to resist the entire motion. Hold this position for 10 seconds. Have your muscles resist the band/tubing as it slowly pulls your foot back to the starting position. Repeat 3 times. Complete this exercises 2 times per day.  Document Released: 09/23/2005 Document Revised: 12/16/2011 Document Reviewed: 01/05/2009  Exit[employer] Patient Information 2014 [place], [place].

## 2024-04-05 NOTE — Progress Notes (Signed)
 Subjective:  Patient ID: Jack Porter, male    DOB: 05/12/1981,  MRN: [id number]  Chief Complaint  Patient presents with   Foot Pain    I have pain on the ball of my feet, on the heels, and on the sides of my feet.    Discussed the use of AI scribe software for clinical note transcription with the patient, who gave verbal consent to proceed.  History of Present Illness Jack Porter is a 43 year old male with diabetes who presents with bilateral foot pain. He is accompanied by his spouse.  He experiences bilateral foot pain described as a 'needle-like' sensation, primarily located on the plantar surfaces of both feet. He notes the presence of a bump on both feet that was not previously there. The pain is exacerbated by standing and walking, particularly on concrete surfaces, and improves with rest.  He has a history of diabetes with a recent A1c of 'eight something', improved from a previous high of thirteen. He has been diabetic for a long time but has only recently started managing it actively. There is no history of neuropathy diagnosis.  He completed a course of Naprosyn , 500 mg twice daily for two weeks, which provided some relief from the pain. However, the pain returned after the medication was finished. Changing shoes also helped alleviate the pain temporarily.  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 He denies wearing steel-toe boots and reports that his current shoes are new but do not have any additional support or cushioning. He has not used any insoles or inserts in his shoes.  No shooting pain into the foot, no pain at night, and no history of neuropathy diagnosis. Reports pain primarily when on his feet.      Objective:    Physical Exam VASCULAR: DP and PT pulse palpable. Foot is warm and well-perfused. Capillary fill time is brisk. DERMATOLOGIC: Normal skin turgor, texture, and temperature. No open lesions, rashes, or  ulcerations. NEUROLOGIC: Normal sensation to light touch and pressure. No paresthesias on examination. ORTHOPEDIC: Mild tenderness to palpation on right plantar medial heel, less on left. No pain in plantar fascia. Diffuse tenderness on ball of foot and metatarsal areas. Splay forefoot type with pes planus.  Gastrocnemius equinus is present   No images are attached to the encounter.    Results LABS A1c: 8.x%  RADIOLOGY Foot X-ray: Plantar calcaneal spurring and posterior calcaneal spurring without fracture or stress fracture (04/05/2024)   Assessment:   1. Plantar fasciitis   2. Metatarsalgia of both feet   3. Gastrocnemius equinus of left lower extremity   4. Gastrocnemius equinus of right lower extremity      Plan:  Patient was evaluated and treated and all questions answered.  Assessment and Plan Assessment & Plan Plantar fasciitis Chronic plantar fasciitis with inflammation of the plantar fascia, likely exacerbated by heel spurs and prolonged standing on concrete surfaces. Symptoms include heel and arch pain, particularly when standing. X-ray shows no fracture. Heel spurs present but not causing significant pain or requiring surgical intervention. - Prescribe meloxicam once daily for anti-inflammatory effect. - Refer to physical therapy for stretching and strengthening exercises, focusing on calf stretching. - Consider cortisone injection into the plantar fascia if symptoms do not improve with initial treatment. - Fit for custom orthotics to provide arch support and reduce pressure on the plantar fascia.  Metatarsalgia Metatarsalgia with diffuse tenderness in the metatarsal areas, likely due to prolonged standing on hard  surfaces and inadequate footwear support. Symptoms improve with rest and worsen with activity. - Prescribe meloxicam once daily for anti-inflammatory effect. - Refer to physical therapy for stretching and strengthening exercises. - Fit for custom orthotics  to provide cushioning and support in the metatarsal area.  Diabetes mellitus Long-standing diabetes mellitus with recent improvement in A1c levels from 13 to 8. Possible early signs of diabetic neuropathy, though not definitive. Symptoms include pins and needles sensation, improving with rest. - Monitor blood sugar levels closely, especially when considering medications like steroids that can affect glucose levels. - Avoid steroid use unless necessary due to potential impact on blood sugar levels.      Return in about 1 month (around 05/05/2024) for recheck plantar fasciitis.

## 2024-04-07 ENCOUNTER — Ambulatory Visit: Admitting: Nurse Practitioner

## 2024-04-07 ENCOUNTER — Ambulatory Visit: Admitting: Family Medicine

## 2024-04-07 ENCOUNTER — Other Ambulatory Visit (HOSPITAL_COMMUNITY): Payer: Self-pay

## 2024-04-07 ENCOUNTER — Telehealth: Payer: Self-pay | Admitting: Pharmacy Technician

## 2024-04-07 NOTE — Progress Notes (Signed)
 04/07/2024 Name: Jack Porter MRN: [id number] DOB: [birth year]  Patient is appearing on a report for [facility] [place] [facility] and last engaged with the clinical pharmacist to discuss diabetes on 02/25/2024. Contacted patient today to discuss diabetes management and completed medication review.   Diabetes Plan from last clinical pharmacist appointment:  Diabetes: - Currently uncontrolled--patient is working to improve - Reviewed long term cardiovascular and renal outcomes of uncontrolled blood sugar - Reviewed goal A1c, goal fasting, and goal 2 hour post prandial glucose - Reviewed dietary modifications including FOLLOWING A HEART HEALTHY DIET/HEALTHY PLATE METHOD - Reviewed lifestyle modifications including: increased physical activity - Recommend to : Restart Ozempic  0.25mg  weekly (titrate slow, start low)--patient has stopped Mounjaro  Continue all other medications as prescribed for now Will leave FSL3+ CGM sensors up front for patient to try--unsure if insurance will cover due to no insulin   - Patient denies personal or family history of multiple endocrine neoplasia type 2, medullary thyroid  cancer; personal history of pancreatitis or gallbladder disease. - Recommend to check glucose daily (fasting) or if symptomatic   Follow Up Plan: 1 month(copy/paste from last note)   Medication Adherence Barriers Identified:  Patient made recommended medication changes per plan: Yes Patient informs he restarted the Ozempic  and seems to be tolerating it without any side effects. He informs he is on Ozempic  0.25mg . He informs he needs a refill sent to his pharmacy. Called [facility] and the pharmacist informs the patient has refills on Ozmepic but it is too soon to fill it at this time as patient picked up 8 week supply on 02/25/24. Patient also informs he is taking Glipizide  10mg  every day, Farixga 10mg  every day and Metformin  1000mg  twice a day. He informs he needs a refill of Metformin  as well. Called  [facility] and requested the pharmacist refill the Metformin  as well as Farxiga  as per Dr Annemarie Farxiga  last filled for 30 day supply on 03/04/2024. Patient was infiormed this medication refills would be ready later today for him to pick up. He was informed to call Ozempic  refill into [facility] in about 7-10 days. Patient verbalized understanding. Access issues with any new medication or testing device: Yes Patinet informs his insurance did not cover the Freestyle Libre 3+ sensors. He informs he had to pay around $100 dollars for 2 sensors and he informs this is not sustainable for him. Reached out to Inocente Butcher, CPhT on the Patient Advocate team and she informs a PA is needed. Sent request via inbasket to RX Prior Auth team to start the PA process.  Patient is checking blood sugars as prescribed: Yes Patient informs he has been using the FreeStyle Libre 3+ CGM and he likes it. He informs this morning before breakfast his blood sugar was 134 and he ifnorms it has been averaging between 180-190. NO lows under 100 report and he informs the highest it has been is 240.   Medication Adherence Barriers Addressed/Actions Taken:  Reviewed medication changes per plan from last clinical pharmacist note Medication Access for FreeStyle Libre 3+ sensors Will discuss medication access concerns with pharmacist Collaborated with Patient Advocate team regarding prior authorization  Educated patient to contact pharmacy regarding new prescriptions   Reviewed instructions for monitoring blood sugars at home and reminded patient to keep a written log to review with pharmacist Reminded patient of date/time of upcoming clinical pharmacist follow up and any upcoming PCP/specialists visits. Patient denies transportation barriers to the appointment. Yes  Next clinical pharmacist appointment is scheduled for: TBD  Naomi Castrogiovanni, CPhT [facility] Office: [phone] Email:  [email or web address]

## 2024-04-08 ENCOUNTER — Telehealth: Payer: Self-pay

## 2024-04-08 ENCOUNTER — Ambulatory Visit

## 2024-04-08 NOTE — Progress Notes (Signed)
 Complex Care Management Care Guide Note  04/08/2024 Name: Jack Porter MRN: [id number] DOB: 1981-03-04  Jack Porter is a 43 y.o. year old male who is a primary care patient of Milian, Marry Lenis, FNP (Inactive) and is actively engaged with the care management team. I reached out to Donnamarie Jack Porter by phone today to assist with scheduling  with the Pharmacist.  Follow up plan: Unsuccessful telephone outreach attempt made. A HIPAA compliant phone message was left for the patient providing contact information and requesting a return call.  Jeoffrey Buffalo , RMA     [facility]  [facility] Guide  Direct Dial: [phone]  Website: [email or web address]

## 2024-04-12 NOTE — Progress Notes (Signed)
 Orthotics   Patient was present and evaluated for Custom molded foot orthotics. Patient will benefit from CFO's to provide total contact to BIL MLA's helping to balance and distribute body weight more evenly across BIL feet helping to reduce plantar pressure and pain. Orthotic will also encourage FF / RF alignment  Patient was scanned today and will return for fitting upon receipt

## 2024-04-19 ENCOUNTER — Telehealth: Payer: Self-pay | Admitting: Pharmacy Technician

## 2024-04-19 ENCOUNTER — Other Ambulatory Visit (HOSPITAL_COMMUNITY): Payer: Self-pay

## 2024-04-19 ENCOUNTER — Ambulatory Visit (INDEPENDENT_AMBULATORY_CARE_PROVIDER_SITE_OTHER): Admitting: Nurse Practitioner

## 2024-04-19 ENCOUNTER — Encounter: Payer: Self-pay | Admitting: Nurse Practitioner

## 2024-04-19 VITALS — BP 114/76 | HR 92 | Temp 97.3°F | Ht 73.0 in | Wt 281.2 lb

## 2024-04-19 DIAGNOSIS — E119 Type 2 diabetes mellitus without complications: Secondary | ICD-10-CM | POA: Diagnosis not present

## 2024-04-19 DIAGNOSIS — E1169 Type 2 diabetes mellitus with other specified complication: Secondary | ICD-10-CM | POA: Diagnosis not present

## 2024-04-19 DIAGNOSIS — E781 Pure hyperglyceridemia: Secondary | ICD-10-CM

## 2024-04-19 DIAGNOSIS — Z7984 Long term (current) use of oral hypoglycemic drugs: Secondary | ICD-10-CM

## 2024-04-19 DIAGNOSIS — I10 Essential (primary) hypertension: Secondary | ICD-10-CM | POA: Insufficient documentation

## 2024-04-19 DIAGNOSIS — E559 Vitamin D deficiency, unspecified: Secondary | ICD-10-CM | POA: Diagnosis not present

## 2024-04-19 DIAGNOSIS — E785 Hyperlipidemia, unspecified: Secondary | ICD-10-CM

## 2024-04-19 LAB — BAYER DCA HB A1C WAIVED: HB A1C (BAYER DCA - WAIVED): 7.9 % — ABNORMAL HIGH (ref 4.8–5.6)

## 2024-04-19 MED ORDER — SEMAGLUTIDE (1 MG/DOSE) 4 MG/3ML ~~LOC~~ SOPN
1.0000 mg | PEN_INJECTOR | SUBCUTANEOUS | 0 refills | Status: DC
Start: 2024-04-19 — End: 2024-07-29

## 2024-04-19 MED ORDER — VITAMIN D (ERGOCALCIFEROL) 1.25 MG (50000 UNIT) PO CAPS: 50000.0000 [IU] | ORAL_CAPSULE | ORAL | 0 refills | Status: DC

## 2024-04-19 MED ORDER — METFORMIN HCL 1000 MG PO TABS: 1000.0000 mg | ORAL_TABLET | Freq: Two times a day (BID) | ORAL | 0 refills | Status: DC

## 2024-04-19 NOTE — Telephone Encounter (Signed)
 Pharmacy Patient Advocate Encounter   Received notification from Staff msgs that prior authorization for [facility] 3 plus is required/requested.   Insurance verification completed.   The patient is insured through [employer] .   Per test claim: PA required; PA started via CoverMyMeds. KEY [id number]   Per Prior Auth questions, it appears that the pt must be on multiple insulin  injections daily or an insulin  pump in order for this to be approved. Is there any additional documentation or clinical rational you'd like me to include to ask for this to be approved, given he is not on insulin ? As it will likely be denied.  Just as an FYI, he could get this from one of the [employer] for $74.99 a month. I do not know if he can get it for that price at other pharmacies.

## 2024-04-19 NOTE — Progress Notes (Signed)
 Established Patient Office Visit  Subjective  Patient ID: Jack Porter, male    DOB: 01/16/1981  Age: 43 y.o. MRN: [id number]  Chief Complaint  Patient presents with   Medical Management of Chronic Issues    3 month    HPI Jack Porter 43 year old male presented April 19, 2024 for follow-up appointment for chronic disease management.  He states since patient first visit with provider as he his PCP Marry Kins, FNP no longer work for the practice.  Diabetes Mellitus Type II, Follow-up  Lab Results  Component Value Date   HGBA1C 8.2 (H) 01/07/2024   HGBA1C 8.3 (H) 09/23/2023   HGBA1C 10.4 (H) 06/12/2023   Wt Readings from Last 3 Encounters:  04/19/24 281 lb 3.2 oz (127.6 kg)  03/03/24 285 lb 3.2 oz (129.4 kg)  01/28/24 278 lb (126.1 kg)   Last seen for diabetes 3 months ago.  Management since then includes Ozempic  0.5 mg every 7 days, metformin  1000 mg twice a day, glipizide  10 mg daily.  A1c from April 2025 8.2% He reports excellent compliance with treatment. He is not having side effects.  Symptoms: No fatigue No foot ulcerations  No appetite changes No nausea  No paresthesia of the feet  No polydipsia  No polyuria No visual disturbances   No vomiting     Home blood sugar records: fasting range: [phone] eating rice/20 tea every day Episodes of hypoglycemia? No   Current insulin  regiment: Not on insulin  most Recent Eye Exam: 06/30/2023 Current exercise: bicycling Current diet habits: on average, 2 meals per day  Pertinent Labs: Lab Results  Component Value Date   CHOL 136 01/07/2024   HDL 29 (L) 01/07/2024   LDLCALC 87 01/07/2024   TRIG 106 01/07/2024   CHOLHDL 4.7 01/07/2024   Lab Results  Component Value Date   NA 140 01/07/2024   K 4.4 01/07/2024   CREATININE 0.59 (L) 01/07/2024   EGFR 123 01/07/2024   MICRALBCREAT 20 01/31/2023    He is agreeable to having Ozempic  increased to 1 mg every 7 days subcu Hypertension, follow-up  BP Readings  from Last 3 Encounters:  04/19/24 114/76  03/03/24 129/79  01/28/24 125/82   Wt Readings from Last 3 Encounters:  04/19/24 281 lb 3.2 oz (127.6 kg)  03/03/24 285 lb 3.2 oz (129.4 kg)  01/28/24 278 lb (126.1 kg)     He was last seen for hypertension 3 months ago.  BP at that visit was 129/79. Management since that visit includes losartan  25 mg daily.  He reports excellent compliance with treatment. He is not having side effects. He is following a Regular diet He is exercising. He does not smoke.  Use of agents associated with hypertension: none.   Outside blood pressures are not being monitored. Symptoms: No chest pain No chest pressure  No palpitations No syncope  No dyspnea No orthopnea  No paroxysmal nocturnal dyspnea No lower extremity edema   Pertinent labs Lab Results  Component Value Date   CHOL 136 01/07/2024   HDL 29 (L) 01/07/2024   LDLCALC 87 01/07/2024   TRIG 106 01/07/2024   CHOLHDL 4.7 01/07/2024   Lab Results  Component Value Date   NA 140 01/07/2024   K 4.4 01/07/2024   CREATININE 0.59 (L) 01/07/2024   EGFR 123 01/07/2024   GLUCOSE 216 (H) 01/07/2024   TSH 2.420 01/31/2023     The 10-year ASCVD risk score (Arnett DK, et al., 2019) is: 3.4% Lipid/Cholesterol, Follow-up  Last lipid panel Other pertinent labs  Lab Results  Component Value Date   CHOL 136 01/07/2024   HDL 29 (L) 01/07/2024   LDLCALC 87 01/07/2024   TRIG 106 01/07/2024   CHOLHDL 4.7 01/07/2024   Lab Results  Component Value Date   ALT 36 01/07/2024   AST 42 (H) 01/07/2024   PLT 152 01/07/2024   TSH 2.420 01/31/2023     He was last seen for this 3 months ago.  Management since that visit includes Crestor  5 mg daily. He reports excellent compliance with treatment. He is not having side effects.  Symptoms: No chest pain No chest pressure/discomfort  No dyspnea No lower extremity edema  No numbness or tingling of extremity Yes orthopnea  No palpitations No paroxysmal  nocturnal dyspnea  No speech difficulty No syncope   Current diet: on average, 2 meals per day Current exercise: bicycling  The 10-year ASCVD risk score (Arnett DK, et al., 2019) is: 3.4%  Vitamin D  deficiency, follow-up  Lab Results  Component Value Date   VD25OH 15.3 (L) 01/07/2024   VD25OH 17.4 (L) 09/23/2023   VD25OH 8.7 (L) 06/12/2023   CALCIUM  9.4 01/07/2024   CALCIUM  10.1 09/23/2023        Wt Readings from Last 3 Encounters:  04/19/24 281 lb 3.2 oz (127.6 kg)  03/03/24 285 lb 3.2 oz (129.4 kg)  01/28/24 278 lb (126.1 kg)    He was last seen for vitamin D  deficiency 3 months ago.  Management since that visit includes vitamin D  50,000 mg weekly. He reports excellent compliance with treatment. He is not having side effects.   Symptoms: No change in energy level No numbness or tingling  No bone pain No unexplained fracture    Flowsheet Row Office Visit from 09/23/2023 in [facility]  PHQ-9 Total Score 0       01/07/2024    1:17 PM 11/20/2023    2:24 PM 09/23/2023    3:40 PM 06/12/2023    4:04 PM  GAD 7 : Generalized Anxiety Score  Nervous, Anxious, on Edge 0 0 0 0  Control/stop worrying 0 0 0 0  Worry too much - different things 0 0 0 0  Trouble relaxing 0 0 0 0  Restless 0 0 0 0  Easily annoyed or irritable 0 0 0 0  Afraid - awful might happen 0 0 0 0  Total GAD 7 Score 0 0 0 0  Anxiety Difficulty Not difficult at all Not difficult at all Not difficult at all Not difficult at all     Patient Active Problem List   Diagnosis Date Noted   Primary hypertension 04/19/2024   Acute bacterial conjunctivitis of both eyes 01/05/2024   Vitamin D  deficiency 09/23/2023   Long-term current use of injectable noninsulin antidiabetic medication 07/10/2023   Diabetes mellitus treated with oral medication (HCC) 07/10/2023   Right foot pain 02/20/2021   Hyperlipidemia associated with type 2 diabetes mellitus (HCC) 12/28/2020   Encounter  for orthopedic follow-up care 02/23/2020   Glenoid labrum tear 01/20/2020   Pain in joint of right shoulder 12/29/2019   Elevated liver enzymes 12/14/2018   Type 2 diabetes mellitus without complication, without long-term current use of insulin  (HCC) 11/02/2018   Morbid obesity (HCC) 11/02/2018   Plantar fasciitis of left foot 11/02/2018   Type 2 diabetes mellitus with hypertriglyceridemia (HCC) 01/20/2014   Past Medical History:  Diagnosis Date   GERD (gastroesophageal reflux disease)  H/O hiatal hernia    Type 2 diabetes mellitus (HCC)    Past Surgical History:  Procedure Laterality Date   CHOLECYSTECTOMY N/A 01/21/2014   Procedure: LAPAROSCOPIC CHOLECYSTECTOMY;  Surgeon: Camellia CHRISTELLA Blush, MD;  Location: [facility];  Service: General;  Laterality: N/A;   Social History   Tobacco Use   Smoking status: Former    Current packs/day: 0.25    Average packs/day: 0.3 packs/day for 3.0 years (0.8 ttl pk-yrs)    Types: Cigarettes   Smokeless tobacco: Never  Vaping Use   Vaping status: Never Used  Substance Use Topics   Alcohol use: No    Comment: rarely   Drug use: No   Social History   Socioeconomic History   Marital status: Married    Spouse name: Not on file   Number of children: Not on file   Years of education: Not on file   Highest education level: 12th grade  Occupational History   Not on file  Tobacco Use   Smoking status: Former    Current packs/day: 0.25    Average packs/day: 0.3 packs/day for 3.0 years (0.8 ttl pk-yrs)    Types: Cigarettes   Smokeless tobacco: Never  Vaping Use   Vaping status: Never Used  Substance and Sexual Activity   Alcohol use: No    Comment: rarely   Drug use: No   Sexual activity: Yes  Other Topics Concern   Not on file  Social History Narrative   Not on file   Social Drivers of Health   Financial Resource Strain: Low Risk  (09/22/2023)   Overall Financial Resource Strain (CARDIA)    Difficulty of Paying Living Expenses: Not  very hard  Food Insecurity: No Food Insecurity (01/02/2024)   [facility] from [facility]   Hunger Vital Sign    Within the past 12 months, you worried that your food would run out before you got the money to buy more.: Never true    Within the past 12 months, the food you bought just didn't last and you didn't have money to get more.: Never true  Transportation Needs: No Transportation Needs (01/02/2024)   Received from [facility]   PRAPARE - Transportation    Lack of Transportation (Medical): No    Lack of Transportation (Non-Medical): No  Physical Activity: Insufficiently Active (09/22/2023)   Exercise Vital Sign    Days of Exercise per Week: 3 days    Minutes of Exercise per Session: 20 min  Stress: No Stress Concern Present (09/22/2023)   [employer] of Occupational Health - Occupational Stress Questionnaire    Feeling of Stress : Not at all  Social Connections: Moderately Integrated (09/22/2023)   Social Connection and Isolation Panel    Frequency of Communication with Friends and Family: Once a week    Frequency of Social Gatherings with Friends and Family: Three times a week    Attends Religious Services: 1 to 4 times per year    Active Member of Clubs or Organizations: No    Attends Banker Meetings: Not on file    Marital Status: Married  Intimate Partner Violence: Not At Risk (01/07/2024)   Humiliation, Afraid, Rape, and Kick questionnaire    Fear of Current or Ex-Partner: No    Emotionally Abused: No    Physically Abused: No    Sexually Abused: No   Family Status  Relation Name Status   Mother  Alive   Father  Alive   Sister  Alive   Brother  Alive   Sister  Alive   Sister  Alive   Brother  Alive  No partnership data on file   Family History  Problem Relation Age of Onset   Diabetes Mother    Heart disease Sister    Diabetes Sister    No Known Allergies    Review of Systems  Constitutional:  Negative for chills and fever.  HENT:   Negative for congestion and sore throat.   Respiratory:  Negative for cough and shortness of breath.   Gastrointestinal:  Negative for diarrhea, nausea and vomiting.  Musculoskeletal:  Negative for falls.  Skin:  Negative for itching and rash.  Neurological:  Negative for dizziness and headaches.  Psychiatric/Behavioral:  Negative for depression and hallucinations.    Negative unless indicated in HPI   Objective:     BP 114/76   Pulse 92   Temp (!) 97.3 F (36.3 C) (Temporal)   Ht 6' 1 (1.854 m)   Wt 281 lb 3.2 oz (127.6 kg)   SpO2 98%   BMI 37.10 kg/m  BP Readings from Last 3 Encounters:  04/19/24 114/76  03/03/24 129/79  01/28/24 125/82   Wt Readings from Last 3 Encounters:  04/19/24 281 lb 3.2 oz (127.6 kg)  03/03/24 285 lb 3.2 oz (129.4 kg)  01/28/24 278 lb (126.1 kg)      Physical Exam Vitals and nursing note reviewed.  Constitutional:      General: He is not in acute distress.    Appearance: He is obese.  HENT:     Head: Normocephalic and atraumatic.     Right Ear: Tympanic membrane, ear canal and external ear normal. There is no impacted cerumen.     Left Ear: Tympanic membrane, ear canal and external ear normal. There is no impacted cerumen.     Nose: Nose normal.     Mouth/Throat:     Mouth: Mucous membranes are moist.  Eyes:     General: No scleral icterus.    Extraocular Movements: Extraocular movements intact.     Conjunctiva/sclera: Conjunctivae normal.     Pupils: Pupils are equal, round, and reactive to light.  Cardiovascular:     Heart sounds: Normal heart sounds.  Pulmonary:     Effort: Pulmonary effort is normal.     Breath sounds: Normal breath sounds.  Musculoskeletal:        General: Normal range of motion.     Right lower leg: No edema.     Left lower leg: No edema.  Skin:    General: Skin is warm and dry.     Findings: No rash.  Neurological:     Mental Status: He is oriented to person, place, and time.  Psychiatric:         Mood and Affect: Mood normal.        Behavior: Behavior normal.        Thought Content: Thought content normal.        Judgment: Judgment normal.      No results found for any visits on 04/19/24.  Last CBC Lab Results  Component Value Date   WBC 4.4 01/07/2024   HGB 15.9 01/07/2024   HCT 46.5 01/07/2024   MCV 89 01/07/2024   MCH 30.5 01/07/2024   RDW 13.3 01/07/2024   PLT 152 01/07/2024   Last metabolic panel Lab Results  Component Value Date   GLUCOSE 216 (H) 01/07/2024   NA 140 01/07/2024   K  4.4 01/07/2024   CL 102 01/07/2024   CO2 26 01/07/2024   BUN 10 01/07/2024   CREATININE 0.59 (L) 01/07/2024   EGFR 123 01/07/2024   CALCIUM  9.4 01/07/2024   PROT 7.0 01/07/2024   ALBUMIN 4.1 01/07/2024   LABGLOB 2.9 01/07/2024   AGRATIO 1.5 01/31/2023   BILITOT 1.6 (H) 01/07/2024   ALKPHOS 101 01/07/2024   AST 42 (H) 01/07/2024   ALT 36 01/07/2024   Last lipids Lab Results  Component Value Date   CHOL 136 01/07/2024   HDL 29 (L) 01/07/2024   LDLCALC 87 01/07/2024   TRIG 106 01/07/2024   CHOLHDL 4.7 01/07/2024   Last hemoglobin A1c Lab Results  Component Value Date   HGBA1C 8.2 (H) 01/07/2024   Last thyroid  functions Lab Results  Component Value Date   TSH 2.420 01/31/2023   T4TOTAL 8.3 05/07/2022        Assessment & Plan:  Type 2 diabetes mellitus with hypertriglyceridemia (HCC) -     Bayer DCA Hb A1c Waived -     Microalbumin / creatinine urine ratio -     Semaglutide  (1 MG/DOSE); Inject 1 mg as directed once a week.  Dispense: 9 mL; Refill: 0  Vitamin D  deficiency -     Vitamin D  (Ergocalciferol ); Take 1 capsule (50,000 Units total) by mouth every 7 (seven) days.  Dispense: 12 capsule; Refill: 0  Primary hypertension  Hyperlipidemia associated with type 2 diabetes mellitus (HCC)  Type 2 diabetes mellitus without complication, without long-term current use of insulin  (HCC) -     metFORMIN  HCl; Take 1 tablet (1,000 mg total) by mouth 2 (two) times  daily with a meal.  Dispense: 180 tablet; Refill: 0  Diabetes mellitus treated with oral medication (HCC) -     metFORMIN  HCl; Take 1 tablet (1,000 mg total) by mouth 2 (two) times daily with a meal.  Dispense: 180 tablet; Refill: 0   Jack Porter is 37 yrs Hispanic male seen today for chronic disease management, no acute distress Diabetes: Increase Ozempic  1 mg every 7 days, metformin  1000 mg twice a day, glipizide  10 mg daily.  Microalbumin ordered Hypertension: Continue losartan  25 mg daily no refill needed Hyperlipidemia: Continue Crestor  5 mg daily no refill needed Vitamin D  deficiency: Vitamin D  supplement 50,000 mcg daily will recheck at next follow-up visit continue  Continue healthy lifestyle choices, including diet (rich in fruits, vegetables, and lean proteins, and low in salt and simple carbohydrates) and exercise (at least 30 minutes of moderate physical activity daily).     The above assessment and management plan was discussed with the patient. The patient verbalized understanding of and has agreed to the management plan. Patient is aware to call the clinic if they develop any new symptoms or if symptoms persist or worsen. Patient is aware when to return to the clinic for a follow-up visit. Patient educated on when it is appropriate to go to the emergency department.   Return in about 3 months (around 07/20/2024) for Chronic Diseases Management.   Jack Porter [place] Thompson, DNP [facility] [address] [place], [place] [address] [phone]    Note: This document was prepared by [employer] voice dictation technology and any errors that results from this process are unintentional.

## 2024-04-20 ENCOUNTER — Ambulatory Visit: Payer: Self-pay | Admitting: Nurse Practitioner

## 2024-04-20 ENCOUNTER — Telehealth: Payer: Self-pay | Admitting: Pharmacy Technician

## 2024-04-20 LAB — MICROALBUMIN / CREATININE URINE RATIO
Creatinine, Urine: 84 mg/dL
Microalb/Creat Ratio: 37 mg/g{creat} — ABNORMAL HIGH (ref 0–29)
Microalbumin, Urine: 30.8 ug/mL

## 2024-04-20 NOTE — Telephone Encounter (Addendum)
 Per Pharmacist, Mliss Griffin, pt is not on insulin , but they would like to proceed with the PA. Clinical notes attached, Clinical questions have been answered and PA submitted. PA currently Pending. Submitted to [employer].

## 2024-04-20 NOTE — Progress Notes (Signed)
   04/20/2024  Patient ID: Jack Porter, male   DOB: October 04, 1981, 43 y.o.   MRN: [id number]  Received in basket message from PharmD regarding FreeStyle 3+ sensors.  Successful outreach to patient. HIPAA verified. Informed patient that we were attempting to obtain a prior authorization for patient's sensors. Informed patient per PharmD note that theres a potential that the prior auth will be denied as patient is not on insulin  and that the cash price was $75. Patient verbalized understanding and if medication is cost prohibitive then he will discuss at his appointment with PharmD in August.  Phillip Maffei, CPhT [facility] Office: [phone] Email: [email or web address]

## 2024-04-28 ENCOUNTER — Other Ambulatory Visit (HOSPITAL_COMMUNITY): Payer: Self-pay

## 2024-05-03 ENCOUNTER — Ambulatory Visit: Admitting: Podiatry

## 2024-05-03 VITALS — Ht 73.0 in | Wt 281.2 lb

## 2024-05-03 DIAGNOSIS — M62462 Contracture of muscle, left lower leg: Secondary | ICD-10-CM

## 2024-05-03 DIAGNOSIS — M62461 Contracture of muscle, right lower leg: Secondary | ICD-10-CM | POA: Diagnosis not present

## 2024-05-03 DIAGNOSIS — M7741 Metatarsalgia, right foot: Secondary | ICD-10-CM | POA: Diagnosis not present

## 2024-05-03 DIAGNOSIS — M722 Plantar fascial fibromatosis: Secondary | ICD-10-CM

## 2024-05-03 DIAGNOSIS — M7742 Metatarsalgia, left foot: Secondary | ICD-10-CM

## 2024-05-03 NOTE — Patient Instructions (Signed)

## 2024-05-03 NOTE — Progress Notes (Signed)
  Subjective:  Patient ID: Jack Porter, male    DOB: July 19, 1981,  MRN: [id number]  Chief Complaint  Patient presents with   Foot Pain    Rm 19 Patient is here for recheck of bilateral plantar fasciitis. Patient states pain is tolerable  Pain describes pain intensity as a 6 on a scale of 1 out 10. Pain is described as throbbing pain.     43 y.o. male presents with the above complaint. History confirmed with patient.  Returns with improvement using meloxicam  he did not receive injections at last visit and did not receive the exercises as well so he has not been doing those.  Was fitted for orthotics but has not received yet.  No new pain or different pain.  Some pain at fifth met base on the right side  Objective:  Physical Exam: warm, good capillary refill, no trophic changes or ulcerative lesions, normal DP and PT pulses, normal sensory exam, and mild plantar left heel pain, mild fifth met base pain right.  LABS A1c: 8.x%   RADIOLOGY Foot X-ray: Plantar calcaneal spurring and posterior calcaneal spurring without fracture or stress fracture (04/05/2024) Assessment:   1. Plantar fasciitis   2. Metatarsalgia of both feet   3. Gastrocnemius equinus of left lower extremity   4. Gastrocnemius equinus of right lower extremity      Plan:  Patient was evaluated and treated and all questions answered.  Reviewed his progress he has had some improvement with meloxicam  I dispensed his home physical therapy exercise program today we discussed injection therapy which I do not think is necessary at this point and hopefully should resolve without it but if worsens at any point he will return for this sooner.  He should receive his orthotics soon and this will help quite a bit I think we also discussed appropriate shoe gear including width and length for his condition and recommended he upsized to a wide with a shoe and likely half his shoe size to accommodate for his orthotics.  Follow-up with me in  2 months if still painful. Return in about 2 months (around 07/04/2024) for recheck plantar fasciitis.

## 2024-05-04 ENCOUNTER — Other Ambulatory Visit (HOSPITAL_COMMUNITY): Payer: Self-pay

## 2024-05-04 NOTE — Telephone Encounter (Signed)
 Mychart message sent notifying pt. LS

## 2024-05-04 NOTE — Telephone Encounter (Signed)
 Pharmacy Patient Advocate Encounter  Received notification from [facility] that Prior Authorization for FreeStyle Libre 3 Plus Sensor  has been DENIED.  Full denial letter will be uploaded to the media tab. See denial reason below.   PA #/Case ID/Reference #: [id number]

## 2024-05-10 NOTE — Progress Notes (Signed)
 05/11/2024 Name: Jack Porter MRN: [id number] DOB: April 17, 1981  Chief Complaint  Patient presents with   Diabetes    Jack Porter is a 43 y.o. year old male who presented for a telephone visit.   They were referred to the pharmacist by their PCP for assistance in managing diabetes.    Subjective:  Patient reports he has been on Ozempic  1mg  weekly for about 2 months.  He has both the Ozempic  1mg  and 2 mg pens at home.  He experienced nausea with the 2mg  dose of Ozempic  in the past  Care Team: Primary Care Provider: Deitra Morton Sebastian Nena, NP ; Next Scheduled Visit: 07/20/2024  Medication Access/Adherence  Current Pharmacy:  [facility] [address] [place] - [address] [place] [address] Phone: [phone] Fax: [phone]   Patient reports affordability concerns with their medications: No  Patient reports access/transportation concerns to their pharmacy: No  Patient reports adherence concerns with their medications:  No     Diabetes:  Current medications: Glipizide  10 mg PO daily, Metformin  1,000 mg PO twice daily, Ozempic  1 mg SQ weekly, Farxiga  10 mg PO daily  Medications tried in the past: Mounjaro , glyburide /met  A1c 7.9% on 04/19/24, decreased from 8.2% on 01/07/24  Current glucose readings: FBG 98-140, afternoon not above 190  Patient denies hypoglycemic s/sx including dizziness, shakiness, sweating. Patient denies hyperglycemic symptoms including polyuria, polydipsia, polyphagia, nocturia, neuropathy, blurred vision.  Current meal patterns:  Discussed meal planning options and Plate method for healthy eating Avoid sugary drinks and desserts Incorporate balanced protein, non starchy veggies, 1 serving of carbohydrate with each meal Increase water intake Increase physical activity as able  - decreased intake pasta and breads - Drinks 140oz of water daily  Current physical activity: walks treadmill daily  Current  medication access support: BCBS   Objective:  Lab Results  Component Value Date   HGBA1C 7.9 (H) 04/19/2024    Lab Results  Component Value Date   CREATININE 0.59 (L) 01/07/2024   BUN 10 01/07/2024   NA 140 01/07/2024   K 4.4 01/07/2024   CL 102 01/07/2024   CO2 26 01/07/2024    Lab Results  Component Value Date   CHOL 136 01/07/2024   HDL 29 (L) 01/07/2024   LDLCALC 87 01/07/2024   TRIG 106 01/07/2024   CHOLHDL 4.7 01/07/2024    Medications Reviewed Today     Reviewed by Billee Mliss BIRCH, [facility] (Pharmacist) on 05/11/24 at 1546  Med List Status: <None>   Medication Order Taking? Sig Documenting Provider Last Dose Status Informant  blood glucose meter kit and supplies KIT [id number]  Dispense based on patient and insurance preference. Use up to four times daily as directed. (FOR ICD-9 250.00, 250.01). Harl Jayson CROME, MD  Active   Continuous Glucose Sensor (FREESTYLE LIBRE 3 PLUS SENSOR) [place] [id number]  Change sensor every 15 days. Apply to back of upper arm; DX: E11.65 Milian, Marry Lenis, FNP  Active   dapagliflozin  propanediol (FARXIGA ) 10 MG TABS tablet [id number]  Take 1 tablet (10 mg total) by mouth daily before breakfast. Cathlene Marry Lenis, FNP  Active     Discontinued 05/11/24 1546 (Change in therapy)   glucose blood (IGLUCOSE TEST STRIPS) test strip [id number]  Use as instructed Joesph Annabella HERO, FNP  Active   losartan  (COZAAR ) 25 MG tablet [id number]  Take 1 tablet by mouth once daily Milian, Marry Lenis, FNP  Active   meloxicam  (MOBIC ) 15  MG tablet [id number]  Take 1 tablet (15 mg total) by mouth daily. Silva Juliene SAUNDERS, DPM  Active   metFORMIN  (GLUCOPHAGE ) 1000 MG tablet [id number]  Take 1 tablet (1,000 mg total) by mouth 2 (two) times daily with a meal. Jack Morton Hummer, Nena, NP  Active   rosuvastatin  (CRESTOR ) 5 MG tablet [id number]  Take 1 tablet (5 mg total) by mouth daily. Cathlene Marry Lenis, FNP  Active   Semaglutide , 1 MG/DOSE, 4 MG/3ML  SOPN [id number]  Inject 1 mg as directed once a week. [place] Thompson, Nena, NP  Active   Vitamin D , Ergocalciferol , (DRISDOL ) 1.25 MG (50000 UNIT) CAPS capsule [id number]  Take 1 capsule (50,000 Units total) by mouth every 7 (seven) days. St Morton Hummer, Nena, NP  Active              Assessment/Plan:   Diabetes: - Currently uncontrolled, but improved  - Reviewed long term cardiovascular and renal outcomes of uncontrolled blood sugar - Reviewed goal A1c, goal fasting, and goal 2 hour post prandial glucose - Reviewed dietary modifications including FOLLOWING A HEART HEALTHY DIET/HEALTHY PLATE METHOD - Recommend to : Increase Ozempic  to 2mg  weekly as tolerated Continue Farxiga  10mg  daily Continue Metformin  STOP glipizide  now - Patient denies personal or family history of multiple endocrine neoplasia type 2, medullary thyroid  cancer; personal history of pancreatitis or gallbladder disease. - Recommend to check glucose daily (fasting) or if symptomatic     Follow Up Plan: 1 month   Mliss Tarry Griffin, PharmD, BCACP, CPP Clinical Pharmacist, [facility]

## 2024-05-11 ENCOUNTER — Other Ambulatory Visit

## 2024-05-11 DIAGNOSIS — Z7985 Long-term (current) use of injectable non-insulin antidiabetic drugs: Secondary | ICD-10-CM

## 2024-05-11 DIAGNOSIS — E119 Type 2 diabetes mellitus without complications: Secondary | ICD-10-CM

## 2024-05-11 DIAGNOSIS — Z7984 Long term (current) use of oral hypoglycemic drugs: Secondary | ICD-10-CM

## 2024-05-12 ENCOUNTER — Telehealth: Payer: Self-pay

## 2024-05-12 NOTE — Telephone Encounter (Signed)
 LVM to schedule orthotic fitting/ pu

## 2024-05-14 DIAGNOSIS — H40013 Open angle with borderline findings, low risk, bilateral: Secondary | ICD-10-CM | POA: Diagnosis not present

## 2024-05-14 DIAGNOSIS — E119 Type 2 diabetes mellitus without complications: Secondary | ICD-10-CM | POA: Diagnosis not present

## 2024-05-19 ENCOUNTER — Other Ambulatory Visit: Payer: Self-pay | Admitting: Nurse Practitioner

## 2024-05-19 DIAGNOSIS — K7581 Nonalcoholic steatohepatitis (NASH): Secondary | ICD-10-CM | POA: Diagnosis not present

## 2024-05-19 DIAGNOSIS — R748 Abnormal levels of other serum enzymes: Secondary | ICD-10-CM

## 2024-05-19 DIAGNOSIS — R7989 Other specified abnormal findings of blood chemistry: Secondary | ICD-10-CM | POA: Diagnosis not present

## 2024-05-19 DIAGNOSIS — R799 Abnormal finding of blood chemistry, unspecified: Secondary | ICD-10-CM | POA: Diagnosis not present

## 2024-05-19 DIAGNOSIS — K831 Obstruction of bile duct: Secondary | ICD-10-CM | POA: Diagnosis not present

## 2024-05-21 ENCOUNTER — Ambulatory Visit
Admission: RE | Admit: 2024-05-21 | Discharge: 2024-05-21 | Disposition: A | Discharge status: Home / Self Care | Source: Ambulatory Visit | Attending: Nurse Practitioner | Admitting: Nurse Practitioner

## 2024-05-21 DIAGNOSIS — K760 Fatty (change of) liver, not elsewhere classified: Secondary | ICD-10-CM | POA: Diagnosis not present

## 2024-05-21 DIAGNOSIS — K7581 Nonalcoholic steatohepatitis (NASH): Secondary | ICD-10-CM

## 2024-05-21 DIAGNOSIS — R748 Abnormal levels of other serum enzymes: Secondary | ICD-10-CM

## 2024-05-21 DIAGNOSIS — R161 Splenomegaly, not elsewhere classified: Secondary | ICD-10-CM | POA: Diagnosis not present

## 2024-05-26 ENCOUNTER — Ambulatory Visit (INDEPENDENT_AMBULATORY_CARE_PROVIDER_SITE_OTHER)

## 2024-05-26 DIAGNOSIS — M722 Plantar fascial fibromatosis: Secondary | ICD-10-CM | POA: Diagnosis not present

## 2024-05-26 DIAGNOSIS — M7741 Metatarsalgia, right foot: Secondary | ICD-10-CM

## 2024-05-26 DIAGNOSIS — M2141 Flat foot [pes planus] (acquired), right foot: Secondary | ICD-10-CM

## 2024-05-26 NOTE — Progress Notes (Signed)
 Patient presents today to pick up custom molded foot orthotics, diagnosed with PF Pes Planus and Metatarsalgia by Dr. Silva.   Orthotics were dispensed and fit was satisfactory. Reviewed instructions for break-in and wear. Written instructions given to patient.  Patient will follow up as needed.   Lolita Schultze Cped, CFo,CFm

## 2024-05-31 NOTE — Progress Notes (Signed)
 05/31/2024 Name: Jack Porter MRN: [id number] DOB: 1980/12/21  Chief Complaint  Patient presents with   Diabetes    Jack Porter is a 43 y.o. year old male who presented for a telephone visit.   They were referred to the pharmacist by their PCP for assistance in managing diabetes.    Subjective:  Patient reports he has started the Ozempic  2mg  weekly.  He experienced nausea with the 2mg  dose of Ozempic  in the past, but is tolerating it okay  Care Team: Primary Care Provider: Deitra Morton Sebastian Nena, NP ; Next Scheduled Visit: 07/20/2024  Medication Access/Adherence  Current Pharmacy:  [facility] [address] [place] - [address] [place] [address] Phone: [phone] Fax: [phone]   Patient reports affordability concerns with their medications: No  Patient reports access/transportation concerns to their pharmacy: No  Patient reports adherence concerns with their medications:  No     Diabetes:  Current medications:  Metformin  1,000 mg PO twice daily Ozempic  2 mg SQ weekly Farxiga  10 mg PO daily  Medications tried in the past: Mounjaro , glyburide /met, glipizide  Statin: rosuvastatin  5 mg daily  A1c 7.9% on 04/19/24, decreased from 8.2% on 01/07/24  Current glucose readings: FBG<130, PPBG<180  Patient denies hypoglycemic s/sx including dizziness, shakiness, sweating. Patient denies hyperglycemic symptoms including polyuria, polydipsia, polyphagia, nocturia, neuropathy, blurred vision.  Current meal patterns:  Discussed meal planning options and Plate method for healthy eating Avoid sugary drinks and desserts Incorporate balanced protein, non starchy veggies, 1 serving of carbohydrate with each meal Increase water intake Increase physical activity as able  Current physical activity: walks treadmill daily, works out at gym  Current medication access support: [employer]   Objective: Lab Results  Component Value Date   HGBA1C  7.9 (H) 04/19/2024   Lab Results  Component Value Date   CREATININE 0.59 (L) 01/07/2024   BUN 10 01/07/2024   NA 140 01/07/2024   K 4.4 01/07/2024   CL 102 01/07/2024   CO2 26 01/07/2024   Lab Results  Component Value Date   CHOL 136 01/07/2024   HDL 29 (L) 01/07/2024   LDLCALC 87 01/07/2024   TRIG 106 01/07/2024   CHOLHDL 4.7 01/07/2024   Medications Reviewed Today     Reviewed by Billee Mliss BIRCH, [facility] (Pharmacist) on 06/09/24 at 1315  Med List Status: <None>   Medication Order Taking? Sig Documenting Provider Last Dose Status Informant  blood glucose meter kit and supplies KIT [id number]  Dispense based on patient and insurance preference. Use up to four times daily as directed. (FOR ICD-9 250.00, 250.01). Harl Jayson CROME, MD  Active   dapagliflozin  propanediol (FARXIGA ) 10 MG TABS tablet [id number]  Take 1 tablet (10 mg total) by mouth daily before breakfast. Cathlene Marry Lenis, FNP  Active   glucose blood (IGLUCOSE TEST STRIPS) test strip [id number]  Use as instructed Joesph Annabella HERO, FNP  Active   losartan  (COZAAR ) 25 MG tablet [id number]  Take 1 tablet by mouth once daily Milian, Marry Lenis, FNP  Active   meloxicam  (MOBIC ) 15 MG tablet [id number]  Take 1 tablet (15 mg total) by mouth daily. Silva Juliene JONELLE, DPM  Active   metFORMIN  (GLUCOPHAGE ) 1000 MG tablet [id number]  Take 1 tablet (1,000 mg total) by mouth 2 (two) times daily with a meal. Deitra Morton Sebastian, Nena, NP  Active   rosuvastatin  (CRESTOR ) 5 MG tablet [id number]  Take 1 tablet (5 mg total) by mouth daily. Milian,  Marry Lenis, FNP  Active   Semaglutide , 1 MG/DOSE, 4 MG/3ML SOPN [id number] Yes Inject 1 mg as directed once a week.  Patient taking differently: Inject 2 mg as directed once a week.   [place], Nena, NP  Active   Vitamin D , Ergocalciferol , (DRISDOL ) 1.25 MG (50000 UNIT) CAPS capsule [id number]  Take 1 capsule (50,000 Units total) by mouth every 7 (seven) days. [place],  Nena, NP  Active             Assessment/Plan:   Diabetes: - Currently uncontrolled, but improved  - Reviewed long term cardiovascular and renal outcomes of uncontrolled blood sugar - Reviewed goal A1c, goal fasting, and goal 2 hour post prandial glucose - Reviewed dietary modifications including FOLLOWING A HEART HEALTHY DIET/HEALTHY PLATE METHOD - Recommend to : Continue Ozempic  2mg  weekly as tolerated Continue Farxiga  10mg  daily Continue Metformin  - Patient denies personal or family history of multiple endocrine neoplasia type 2, medullary thyroid  cancer; personal history of pancreatitis or gallbladder disease. - Recommend to check glucose daily (fasting) or if symptomatic   Follow Up Plan: 3 months PharmD, PCP 10/14  Mliss Tarry Griffin, PharmD, BCACP, CPP Clinical Pharmacist, [facility]

## 2024-06-01 ENCOUNTER — Ambulatory Visit

## 2024-06-01 DIAGNOSIS — K7581 Nonalcoholic steatohepatitis (NASH): Secondary | ICD-10-CM | POA: Diagnosis not present

## 2024-06-01 DIAGNOSIS — R748 Abnormal levels of other serum enzymes: Secondary | ICD-10-CM | POA: Diagnosis not present

## 2024-06-01 DIAGNOSIS — E119 Type 2 diabetes mellitus without complications: Secondary | ICD-10-CM

## 2024-06-11 DIAGNOSIS — B009 Herpesviral infection, unspecified: Secondary | ICD-10-CM | POA: Diagnosis not present

## 2024-06-14 DIAGNOSIS — E119 Type 2 diabetes mellitus without complications: Secondary | ICD-10-CM | POA: Diagnosis not present

## 2024-06-14 DIAGNOSIS — H40011 Open angle with borderline findings, low risk, right eye: Secondary | ICD-10-CM | POA: Diagnosis not present

## 2024-06-14 DIAGNOSIS — H40013 Open angle with borderline findings, low risk, bilateral: Secondary | ICD-10-CM | POA: Diagnosis not present

## 2024-06-25 DIAGNOSIS — E119 Type 2 diabetes mellitus without complications: Secondary | ICD-10-CM | POA: Diagnosis not present

## 2024-06-25 DIAGNOSIS — H40013 Open angle with borderline findings, low risk, bilateral: Secondary | ICD-10-CM | POA: Diagnosis not present

## 2024-07-06 ENCOUNTER — Encounter: Payer: Self-pay | Admitting: Podiatry

## 2024-07-06 ENCOUNTER — Encounter: Payer: Self-pay | Admitting: Gastroenterology

## 2024-07-06 ENCOUNTER — Ambulatory Visit: Admitting: Podiatry

## 2024-07-06 DIAGNOSIS — M2141 Flat foot [pes planus] (acquired), right foot: Secondary | ICD-10-CM | POA: Diagnosis not present

## 2024-07-06 DIAGNOSIS — M722 Plantar fascial fibromatosis: Secondary | ICD-10-CM

## 2024-07-06 DIAGNOSIS — M2142 Flat foot [pes planus] (acquired), left foot: Secondary | ICD-10-CM

## 2024-07-06 DIAGNOSIS — M7741 Metatarsalgia, right foot: Secondary | ICD-10-CM | POA: Diagnosis not present

## 2024-07-06 DIAGNOSIS — M7742 Metatarsalgia, left foot: Secondary | ICD-10-CM | POA: Diagnosis not present

## 2024-07-06 NOTE — Progress Notes (Signed)
  Subjective:  Patient ID: Jack Porter, male    DOB: 09-Feb-1981,  MRN: [id number]  Chief Complaint  Patient presents with   Plantar Fasciitis    RM 3 Return in about 2 months (around 07/04/2024) for recheck plantar fasciitis. Pt states pain on the lateral side of the left foot. Pain has improved 78%.    43 y.o. male presents with the above complaint. History confirmed with patient.  Doing very well his pain is nearly fully resolved, he received his orthotics and they are very comfortable for him  Objective:  Physical Exam: warm, good capillary refill, no trophic changes or ulcerative lesions, normal DP and PT pulses, normal sensory exam, and mild plantar left heel pain, mild fifth met base pain right.  LABS A1c: 8.x%   RADIOLOGY Foot X-ray: Plantar calcaneal spurring and posterior calcaneal spurring without fracture or stress fracture (04/05/2024) Assessment:   1. Plantar fasciitis   2. Metatarsalgia of both feet   3. Pes planus of both feet      Plan:  Patient was evaluated and treated and all questions answered.  Doing well and orthotic support is helping him quite a bit.  Would like a second pair made.  We discussed continuing his stretching and therapy exercises at home.  Follow-up with me as needed if it worsens or returns  No follow-ups on file.

## 2024-07-12 DIAGNOSIS — H40012 Open angle with borderline findings, low risk, left eye: Secondary | ICD-10-CM | POA: Diagnosis not present

## 2024-07-19 ENCOUNTER — Telehealth: Payer: Self-pay

## 2024-07-19 NOTE — Telephone Encounter (Signed)
 Requested 2nd pair of orthotics to be ordered will bill for 1ea if not covered will collect half off $271   Lolita Schultze Cped, CFo, CFm

## 2024-07-20 ENCOUNTER — Ambulatory Visit: Admitting: Nurse Practitioner

## 2024-07-29 ENCOUNTER — Encounter: Payer: Self-pay | Admitting: Nurse Practitioner

## 2024-07-29 ENCOUNTER — Ambulatory Visit: Admitting: Nurse Practitioner

## 2024-07-29 VITALS — BP 124/78 | HR 100 | Temp 98.7°F | Ht 73.0 in | Wt 283.8 lb

## 2024-07-29 DIAGNOSIS — E1169 Type 2 diabetes mellitus with other specified complication: Secondary | ICD-10-CM

## 2024-07-29 DIAGNOSIS — E119 Type 2 diabetes mellitus without complications: Secondary | ICD-10-CM

## 2024-07-29 DIAGNOSIS — E559 Vitamin D deficiency, unspecified: Secondary | ICD-10-CM | POA: Diagnosis not present

## 2024-07-29 DIAGNOSIS — Z7984 Long term (current) use of oral hypoglycemic drugs: Secondary | ICD-10-CM

## 2024-07-29 DIAGNOSIS — I10 Essential (primary) hypertension: Secondary | ICD-10-CM

## 2024-07-29 DIAGNOSIS — E785 Hyperlipidemia, unspecified: Secondary | ICD-10-CM

## 2024-07-29 MED ORDER — LOSARTAN POTASSIUM 25 MG PO TABS
25.0000 mg | ORAL_TABLET | Freq: Every day | ORAL | 0 refills | Status: AC
Start: 2024-07-29 — End: ?

## 2024-07-29 MED ORDER — VITAMIN D (ERGOCALCIFEROL) 1.25 MG (50000 UNIT) PO CAPS: 50000.0000 [IU] | ORAL_CAPSULE | ORAL | 0 refills | Status: AC

## 2024-07-29 MED ORDER — METFORMIN HCL 1000 MG PO TABS: 1000.0000 mg | ORAL_TABLET | Freq: Two times a day (BID) | ORAL | 0 refills | Status: AC

## 2024-07-29 MED ORDER — PRAVASTATIN SODIUM 10 MG PO TABS: 10.0000 mg | ORAL_TABLET | Freq: Every day | ORAL | 0 refills | Status: AC

## 2024-07-29 NOTE — Progress Notes (Signed)
 Subjective:  Patient ID: Jack Porter, male    DOB: 1981-04-03, 43 y.o.   MRN: [id number]  Patient Care Team: Deitra Morton Sebastian Nena, NP as PCP - General (Nurse Practitioner) Vicci Mcardle, OD (Optometry)   Chief Complaint:  Medical Management of Chronic Issues (3 month)   HPI: Jack Porter is a 43 y.o. male presenting on 07/29/2024 for Medical Management of Chronic Issues (3 month)   Discussed the use of AI scribe software for clinical note transcription with the patient, who gave verbal consent to proceed.  History of Present Illness Jack Porter is a 43 year old male with chronic disease management who presents for a three-month follow-up.  He has been recently diagnosed with fatty liver, affecting 60% of his liver. He is actively seeking lifestyle changes to manage this condition, including consuming a blend of beet juice, ginger, turmeric, and cayenne pepper every morning. He is motivated to improve his liver health and overall well-being.  His hypertension is treated with losartan  25 mg daily. For diabetes, he takes metformin  1000 mg twice daily and Farxiga  10 mg daily. He discontinued Ozempic  due to side effects and concerns about its impact on eye health. He is exploring natural juices as an alternative approach to managing his diabetes.  He has hyperlipidemia and was previously on Crestor  5 mg daily.  He has a vitamin D  deficiency and is on a regimen of 50,000 units weekly, which is going well.  He underwent eye surgery to relieve pressure in both eyes and has a follow-up appointment scheduled. His eye pressure is decreasing.  He has experienced improvement in foot health after obtaining custom insoles, which have significantly alleviated his symptoms.  He has made significant dietary changes, reducing intake of restaurant food and processed items, and increasing consumption of fresh vegetables, lean meats, and natural juices. He reports increased energy  levels and a positive impact on his daily activities. He engages in regular physical activity, including push-ups and walking, and is gradually increasing his exercise tolerance.  No current issues with bowel movements. Increased energy levels.      Relevant past medical, surgical, family, and social history reviewed and updated as indicated.  Allergies and medications reviewed and updated. Data reviewed: Chart in Epic.   Past Medical History:  Diagnosis Date   GERD (gastroesophageal reflux disease)    H/O hiatal hernia    Type 2 diabetes mellitus (HCC)     Past Surgical History:  Procedure Laterality Date   CHOLECYSTECTOMY N/A 01/21/2014   Procedure: LAPAROSCOPIC CHOLECYSTECTOMY;  Surgeon: Camellia CHRISTELLA Blush, MD;  Location: [facility];  Service: General;  Laterality: N/A;    Social History   Socioeconomic History   Marital status: Married    Spouse name: Not on file   Number of children: Not on file   Years of education: Not on file   Highest education level: 12th grade  Occupational History   Not on file  Tobacco Use   Smoking status: Former    Current packs/day: 0.25    Average packs/day: 0.3 packs/day for 3.0 years (0.8 ttl pk-yrs)    Types: Cigarettes   Smokeless tobacco: Never  Vaping Use   Vaping status: Never Used  Substance and Sexual Activity   Alcohol use: No    Comment: rarely   Drug use: No   Sexual activity: Yes  Other Topics Concern   Not on file  Social History Narrative   Not on file  Social Drivers of Corporate investment banker Strain: Low Risk  (09/22/2023)   Overall Financial Resource Strain (CARDIA)    Difficulty of Paying Living Expenses: Not very hard  Food Insecurity: Low Risk  (05/19/2024)   Received from [facility]   Hunger Vital Sign    Within the past 12 months, you worried that your food would run out before you got money to buy more: Never true    Within the past 12 months, the food you bought just didn't last and you didn't have  money to get more. : Never true  Transportation Needs: No Transportation Needs (05/19/2024)   Received from [facility]    In the past 12 months, has lack of reliable transportation kept you from medical appointments, meetings, work or from getting things needed for daily living? : No  Physical Activity: Insufficiently Active (09/22/2023)   Exercise Vital Sign    Days of Exercise per Week: 3 days    Minutes of Exercise per Session: 20 min  Stress: No Stress Concern Present (09/22/2023)   [employer] of Occupational Health - Occupational Stress Questionnaire    Feeling of Stress : Not at all  Social Connections: Moderately Integrated (09/22/2023)   Social Connection and Isolation Panel    Frequency of Communication with Friends and Family: Once a week    Frequency of Social Gatherings with Friends and Family: Three times a week    Attends Religious Services: 1 to 4 times per year    Active Member of Clubs or Organizations: No    Attends Banker Meetings: Not on file    Marital Status: Married  Catering manager Violence: Not At Risk (01/07/2024)   Humiliation, Afraid, Rape, and Kick questionnaire    Fear of Current or Ex-Partner: No    Emotionally Abused: No    Physically Abused: No    Sexually Abused: No    Outpatient Encounter Medications as of 07/29/2024  Medication Sig   blood glucose meter kit and supplies KIT Dispense based on patient and insurance preference. Use up to four times daily as directed. (FOR ICD-9 250.00, 250.01).   dapagliflozin  propanediol (FARXIGA ) 10 MG TABS tablet Take 1 tablet (10 mg total) by mouth daily before breakfast.   glucose blood (IGLUCOSE TEST STRIPS) test strip Use as instructed   pravastatin (PRAVACHOL) 10 MG tablet Take 1 tablet (10 mg total) by mouth daily.   [DISCONTINUED] losartan  (COZAAR ) 25 MG tablet Take 1 tablet by mouth once daily   [DISCONTINUED] meloxicam  (MOBIC ) 15 MG tablet Take 1 tablet (15 mg  total) by mouth daily.   [DISCONTINUED] metFORMIN  (GLUCOPHAGE ) 1000 MG tablet Take 1 tablet (1,000 mg total) by mouth 2 (two) times daily with a meal.   [DISCONTINUED] rosuvastatin  (CRESTOR ) 5 MG tablet Take 1 tablet (5 mg total) by mouth daily.   [DISCONTINUED] Vitamin D , Ergocalciferol , (DRISDOL ) 1.25 MG (50000 UNIT) CAPS capsule Take 1 capsule (50,000 Units total) by mouth every 7 (seven) days.   losartan  (COZAAR ) 25 MG tablet Take 1 tablet (25 mg total) by mouth daily.   metFORMIN  (GLUCOPHAGE ) 1000 MG tablet Take 1 tablet (1,000 mg total) by mouth 2 (two) times daily with a meal.   Vitamin D , Ergocalciferol , (DRISDOL ) 1.25 MG (50000 UNIT) CAPS capsule Take 1 capsule (50,000 Units total) by mouth every 7 (seven) days.   [DISCONTINUED] Semaglutide , 1 MG/DOSE, 4 MG/3ML SOPN Inject 1 mg as directed once a week. (Patient not taking: Reported on 07/29/2024)  No facility-administered encounter medications on file as of 07/29/2024.    No Known Allergies  Pertinent ROS per HPI, otherwise unremarkable      Objective:  BP 124/78   Pulse 100   Temp 98.7 F (37.1 C) (Temporal)   Ht 6' 1 (1.854 m)   Wt 283 lb 12.8 oz (128.7 kg)   SpO2 98%   BMI 37.44 kg/m    Wt Readings from Last 3 Encounters:  07/29/24 283 lb 12.8 oz (128.7 kg)  05/03/24 281 lb 3.2 oz (127.6 kg)  04/19/24 281 lb 3.2 oz (127.6 kg)   BP Readings from Last 3 Encounters:  07/29/24 124/78  04/19/24 114/76  03/03/24 129/79     Physical Exam Vitals and nursing note reviewed.  Constitutional:      Appearance: Normal appearance. He is obese.  HENT:     Head: Normocephalic and atraumatic.     Nose: Nose normal.     Mouth/Throat:     Mouth: Mucous membranes are moist.  Eyes:     General: No scleral icterus.    Extraocular Movements: Extraocular movements intact.     Conjunctiva/sclera: Conjunctivae normal.     Pupils: Pupils are equal, round, and reactive to light.  Cardiovascular:     Heart sounds: Normal heart  sounds.  Pulmonary:     Effort: Pulmonary effort is normal.     Breath sounds: Normal breath sounds.  Abdominal:     General: Bowel sounds are normal.     Palpations: Abdomen is soft.  Musculoskeletal:        General: Normal range of motion.     Right lower leg: No edema.     Left lower leg: No edema.  Skin:    General: Skin is warm and dry.     Findings: No rash.  Neurological:     Mental Status: He is alert and oriented to person, place, and time.  Psychiatric:        Mood and Affect: Mood normal.        Behavior: Behavior normal.        Thought Content: Thought content normal.        Judgment: Judgment normal.    Physical Exam      Results for orders placed or performed in visit on 04/19/24  Bayer DCA Hb A1c Waived   Collection Time: 04/19/24 11:15 AM  Result Value Ref Range   HB A1C (BAYER DCA - WAIVED) 7.9 (H) 4.8 - 5.6 %  Microalbumin / creatinine urine ratio   Collection Time: 04/19/24 11:15 AM  Result Value Ref Range   Creatinine, Urine 84.0 Not Estab. mg/dL   Microalbumin, Urine 69.1 Not Estab. ug/mL   Microalb/Creat Ratio 37 (H) 0 - 29 mg/g creat       Pertinent labs & imaging results that were available during my care of the patient were reviewed by me and considered in my medical decision making.  Assessment & Plan:  Erika was seen today for medical management of chronic issues.  Diagnoses and all orders for this visit:  Primary hypertension -     losartan  (COZAAR ) 25 MG tablet; Take 1 tablet (25 mg total) by mouth daily.  Type 2 diabetes mellitus without complication, without long-term current use of insulin  (HCC) -     metFORMIN  (GLUCOPHAGE ) 1000 MG tablet; Take 1 tablet (1,000 mg total) by mouth 2 (two) times daily with a meal.  Hyperlipidemia associated with type 2 diabetes mellitus (HCC) -  pravastatin (PRAVACHOL) 10 MG tablet; Take 1 tablet (10 mg total) by mouth daily.  Vitamin D  deficiency -     Vitamin D , Ergocalciferol , (DRISDOL ) 1.25  MG (50000 UNIT) CAPS capsule; Take 1 capsule (50,000 Units total) by mouth every 7 (seven) days.  Diabetes mellitus treated with oral medication (HCC) -     metFORMIN  (GLUCOPHAGE ) 1000 MG tablet; Take 1 tablet (1,000 mg total) by mouth 2 (two) times daily with a meal.     Assessment and Plan Dawson 43 year old Hispanic male seen today for chronic disease management, no acute distress Assessment & Plan Type 2 Diabetes Mellitus Managed with metformin . Ozempic  discontinued due to side effects and concerns about eye pressure. - Continue metformin  1000 mg twice a day. - Farxiga  10 mg daily no refill needed  Primary Hypertension Managed with losartan . - Continue losartan  25 mg daily.  Hyperlipidemia Switching to Prevastatin due to liver concerns. - Discontinue Crestor  and start Prevastatin.  Morbid Obesity Addressed through lifestyle modifications. - Encourage continuation of lifestyle modifications including dietary changes and increased physical activity.  Fatty Liver Disease 60% liver involvement. Addressed through lifestyle changes. - Encourage continuation of dietary modifications and increased physical activity.  Vitamin D  Deficiency Managed with vitamin D  supplementation. - Continue vitamin D  50000 mg weekly. - Refill vitamin D  prescription. - Check vitamin D  levels at next visit.      Continue all other maintenance medications.  Follow up plan: Return in about 3 months (around 10/29/2024) for Chronic Diseases  management.   Continue healthy lifestyle choices, including diet (rich in fruits, vegetables, and lean proteins, and low in salt and simple carbohydrates) and exercise (at least 30 minutes of moderate physical activity daily).  Educational handout given for    Clinical References  Daily Diabetes Record  Use this form to record your blood sugar, or blood glucose (BG), results as well as any diabetes medicines you take, including insulin . Check your BG as told  by your health care provider. A record of your BG results and a list of your current medicines are very helpful in managing your diabetes. Your BG numbers help your provider know whether your diabetes management plan needs to be changed. Patient name: ____________________________________ Week of ____________________ Daily BG results and diabetes medicines Date: _________ Breakfast - BG / Medicines: ________________ / __________________________________________________________ Lunch - BG / Medicines: ___________________ / __________________________________________________________ Dinner - BG / Medicines: __________________ / __________________________________________________________ Bedtime - BG / Medicines: ________________ / ___________________________________________________________ Date: _________ Breakfast - BG / Medicines: ________________ / __________________________________________________________ Lunch - BG / Medicines: ___________________ / __________________________________________________________ Dinner - BG / Medicines: __________________ / __________________________________________________________ Bedtime - BG / Medicines: ________________ / ___________________________________________________________ Date: _________ Breakfast - BG / Medicines: ________________ / __________________________________________________________ Lunch - BG / Medicines: ___________________ / __________________________________________________________ Dinner - BG / Medicines: __________________ / __________________________________________________________ Bedtime - BG / Medicines: ________________ / ___________________________________________________________ Date: _________ Breakfast - BG / Medicines: ________________ / __________________________________________________________ Lunch - BG / Medicines: ___________________ / __________________________________________________________ Dinner - BG / Medicines:  __________________ / __________________________________________________________ Bedtime - BG / Medicines: ________________ / ___________________________________________________________ Date: _________ Breakfast - BG / Medicines: ________________ / __________________________________________________________ Lunch - BG / Medicines: ___________________ / __________________________________________________________ Dinner - BG / Medicines: __________________ / __________________________________________________________ Bedtime - BG / Medicines: ________________ / ___________________________________________________________ Date: _________ Breakfast - BG / Medicines: ________________ / __________________________________________________________ Lunch - BG / Medicines: ___________________ / __________________________________________________________ Dinner - BG / Medicines: __________________ / __________________________________________________________ Bedtime - BG / Medicines: ________________ / ___________________________________________________________ Date: _________ Breakfast - BG /  Medicines: ________________ / __________________________________________________________ Lunch - BG / Medicines: ___________________ / __________________________________________________________ Dinner - BG / Medicines: __________________ / __________________________________________________________ Bedtime - BG / Medicines: ________________ / ___________________________________________________________ Notes: ______________________________________________________________________________________________________________________ This information is not intended to replace advice given to you by your health care provider. Make sure you discuss any questions you have with your health care provider. Document Revised: 05/02/2023 Document Reviewed: 05/02/2023 Elsevier Patient Education  2024 Elsevier Inc. Diabetes Action Plan A  diabetes action plan is a way for you to manage your symptoms of diabetes, also called diabetes mellitus. The plan is color-coded to guide you on what actions to take based on any symptoms you're having. If you have symptoms in the red zone, you need medical care right away. If you have symptoms in the yellow zone, your diabetes isn't under control, and you may need to make some changes. If you have symptoms in the green zone, you're doing well. Understanding diabetes can take time. Follow the treatment plan that you created with your health care provider. Know the target range for your blood sugar, also called glucose. Review your plan each time you visit your provider. The target range for my blood sugar level is __________________________ mg/dL. Red zone Get medical help right away if you have any of the following symptoms: A blood sugar test result that's below 54 mg/dL (3 mmol/L). A blood sugar test result that's at or above 240 mg/dL (86.6 mmol/L) for 2 days in a row along with: Extreme thirst and frequent peeing. Confusion or trouble thinking clearly. Moderate or large ketone levels in your pee (urine). Feeling tired or having no energy. Trouble breathing. Sickness or a fever for 2 or more days that's not getting better. These symptoms may be an emergency. Call 911 right away. Do not wait to see if the symptoms will go away. Do not drive yourself to the hospital. If you have very low blood sugar, also called severe hypoglycemia, and you can't eat or drink, you may need glucagon. Make sure a family member or close friend knows how to check your blood sugar and how to give you glucagon. You may need to be treated in a hospital for this condition. Yellow zone If you have any of the following symptoms, your diabetes isn't under control, and you may need to make some changes: A blood sugar test result that's at or above 240 mg/dL (86.6 mmol/L) for 2 days in a row. Blood sugar test results  that are below 70 mg/dL (3.9 mmol/L). Other symptoms of hypoglycemia, such as: Shaking or feeling light-headed. Confusion or irritability. Feeling hungry. Having a fast heartbeat. If you have any yellow zone symptoms: Treat your hypoglycemia by eating or drinking 15 grams of a rapid-acting carbohydrate. Follow the 15:15 rule: Take 15 grams of a rapid-acting carbohydrate, such as: 1 tube of glucose gel. 4 glucose pills. 4 oz (120 mL) of fruit juice. 4 oz (120 mL) of regular (not diet) soda. Check your blood sugar again 15 minutes after you take the carbohydrate. If the second blood sugar test is still at or below 70 mg/dL (3.9 mmol/L), take 15 grams of a carbohydrate again. If your blood sugar doesn't increase above 70 mg/dL (3.9 mmol/L) after 3 tries, get medical help right away. After your blood sugar returns to normal, eat a meal or a snack within 1 hour. Keep taking your daily medicines as told by your provider. Check your blood sugar more often than you normally would. Write down your results. Call  your provider if you have trouble keeping your blood sugar in your target range. Green zone These signs mean you're doing well and can continue what you're doing to manage your diabetes: Your blood sugar is within your personal target range. For most people, a blood sugar level before a meal should be 80-130 mg/dL (4.4-7.2 mmol/L). You feel well, and you're able to do daily activities. If you're in the green zone, continue to manage your diabetes as told by your provider. To do this: Eat a healthy diet. Exercise regularly. Check your blood sugar as told. Take your medicines only as told. Where to find more information American Diabetes Association (ADA): diabetes.org Association of Diabetes Care & Education Specialists (ADCES): adces.org/diabetes-education-dsmes This information is not intended to replace advice given to you by your health care provider. Make sure you discuss any  questions you have with your health care provider. Document Revised: 05/14/2023 Document Reviewed: 05/14/2023 Elsevier Patient Education  2024 Elsevier Inc. Diabetes Mellitus and Exercise Regular exercise is important for your health, especially if you have diabetes mellitus. Exercise is not just about losing weight. It can also help you increase muscle strength and bone density and reduce body fat and stress. This can help your level of endurance and make you more fit and flexible. Why should I exercise if I have diabetes? Exercise has many benefits for people with diabetes. It can: Help lower and control your blood sugar (glucose). Help your body respond better and become more sensitive to the hormone insulin . Reduce how much insulin  your body needs. Lower your risk for heart disease by: Lowering how much bad cholesterol and triglycerides you have in your body. Increasing how much good cholesterol you have in your body. Lowering your blood pressure. Lowering your blood glucose levels. What is my activity plan? Your health care provider or an expert trained in diabetes care (certified diabetes educator) can help you make an activity plan. This plan can help you find the type of exercise that works for you. It may also tell you how often to exercise and for how long. Be sure to: Get at least 150 minutes of medium-intensity or high-intensity exercise each week. This may involve brisk walking, biking, or water aerobics. Do stretching and strengthening exercises at least 2 times a week. This may involve yoga or weight lifting. Spread out your activity over at least 3 days of the week. Get some form of physical activity each day. Do not go more than 2 days in a row without some kind of activity. Avoid being inactive for more than 30 minutes at a time. Take frequent breaks to walk or stretch. Choose activities that you enjoy. Set goals that you know you can accomplish. Start slowly and  increase the intensity of your exercise over time. How do I manage my diabetes during exercise?  Monitor your blood glucose Check your blood glucose before and after you exercise. If your blood glucose is 240 mg/dL (86.6 mmol/L) or higher before you exercise, check your urine for ketones. These are chemicals created by the liver. If you have ketones in your urine, do not exercise until your blood glucose returns to normal. If your blood glucose is 100 mg/dL (5.6 mmol/L) or lower, eat a snack that has 15-20 grams of carbohydrate in it. Check your blood glucose 15 minutes after the snack to make sure that your level is above 100 mg/dL (5.6 mmol/L) before you start to exercise. Your risk for low blood glucose (hypoglycemia) goes up  during and after exercise. Know the symptoms of this condition and how to treat it. Follow these instructions at home: Keep a carbohydrate snack on hand for use before, during, and after exercise. This can help prevent or treat hypoglycemia. Avoid injecting insulin  into parts of your body that are going to be used during exercise. This may include: Your arms, when you are going to play tennis. Your legs, when you are about to go jogging. Keep track of your exercise habits. This can help you and your health care provider watch and adjust your activity plan. Write down: What you eat before and after you exercise. Blood glucose levels before and after you exercise. The type and amount of exercise you do. Talk to your health care provider before you start a new activity. They may need to: Make sure that the activity is safe for you. Adjust your insulin , other medicines, and food that you eat. Drink water while you exercise. This can stop you from losing too much water (dehydration). It can also prevent problems caused by having a lot of heat in your body (heat stroke). Where to find more information American Diabetes Association: diabetes.org Association of Diabetes Care &  Education Specialists: diabeteseducator.org This information is not intended to replace advice given to you by your health care provider. Make sure you discuss any questions you have with your health care provider. Document Revised: 03/13/2022 Document Reviewed: 03/13/2022 Elsevier Patient Education  2024 Elsevier Inc. Diabetes Mellitus and Foot Care Diabetes, also called diabetes mellitus, may cause problems with your feet and legs because of poor blood flow (circulation). Poor circulation may make your skin: Become thinner and drier. Break more easily. Heal more slowly. Peel and crack. You may also have nerve damage (neuropathy). This can cause decreased feeling in your legs and feet. This means that you may not notice minor injuries to your feet that could lead to more serious problems. Finding and treating problems early is the best way to prevent future foot problems. How to care for your feet Foot hygiene  Wash your feet daily with warm water and mild soap. Do not use hot water. Then, pat your feet and the areas between your toes until they are fully dry. Do not soak your feet. This can dry your skin. Trim your toenails straight across. Do not dig under them or around the cuticle. File the edges of your nails with an emery board or nail file. Apply a moisturizing lotion or petroleum jelly to the skin on your feet and to dry, brittle toenails. Use lotion that does not contain alcohol and is unscented. Do not apply lotion between your toes. Shoes and socks Wear clean socks or stockings every day. Make sure they are not too tight. Do not wear knee-high stockings. These may decrease blood flow to your legs. Wear shoes that fit well and have enough cushioning. Always look in your shoes before you put them on to be sure there are no objects inside. To break in new shoes, wear them for just a few hours a day. This prevents injuries on your feet. Wounds, scrapes, corns, and calluses  Check your  feet daily for blisters, cuts, bruises, sores, and redness. If you cannot see the bottom of your feet, use a mirror or ask someone for help. Do not cut off corns or calluses or try to remove them with medicine. If you find a minor scrape, cut, or break in the skin on your feet, keep it and the  skin around it clean and dry. You may clean these areas with mild soap and water. Do not clean the area with peroxide, alcohol, or iodine. If you have a wound, scrape, corn, or callus on your foot, look at it several times a day to make sure it is healing and not infected. Check for: Redness, swelling, or pain. Fluid or blood. Warmth. Pus or a bad smell. General tips Do not cross your legs. This may decrease blood flow to your feet. Do not use heating pads or hot water bottles on your feet. They may burn your skin. If you have lost feeling in your feet or legs, you may not know this is happening until it is too late. Protect your feet from hot and cold by wearing shoes, such as at the beach or on hot pavement. Schedule a complete foot exam at least once a year or more often if you have foot problems. Report any cuts, sores, or bruises to your health care provider right away. Where to find more information American Diabetes Association: diabetes.org Association of Diabetes [employer] Education Specialists: diabeteseducator.org Contact a health care provider if: You have a condition that increases your risk of infection, and you have any cuts, sores, or bruises on your feet. You have an injury that is not healing. You have redness on your legs or feet. You feel burning or tingling in your legs or feet. You have pain or cramps in your legs and feet. Your legs or feet are numb. Your feet always feel cold. You have pain around any toenails. Get help right away if: You have a wound, scrape, corn, or callus on your foot and: You have signs of infection. You have a fever. You have a red line going up your  leg. This information is not intended to replace advice given to you by your health care provider. Make sure you discuss any questions you have with your health care provider. Document Revised: 03/27/2022 Document Reviewed: 03/27/2022 Elsevier Patient Education  2024 [employer] Dyslipidemia Dyslipidemia is an imbalance of waxy, fat-like substances (lipids) in the blood. The body needs lipids in small amounts. Dyslipidemia often involves a high level of cholesterol or triglycerides, which are types of lipids. Common forms of dyslipidemia include: High levels of LDL cholesterol. LDL is the type of cholesterol that causes fatty deposits (plaques) to build up in the blood vessels that carry blood away from the heart (arteries). Low levels of HDL cholesterol. HDL cholesterol is the type of cholesterol that protects against heart disease. High levels of HDL remove the LDL buildup from arteries. High levels of triglycerides. Triglycerides are a fatty substance in the blood that is linked to a buildup of plaques in the arteries. What are the causes? There are two main types of dyslipidemia: primary and secondary. Primary dyslipidemia is caused by changes (mutations) in genes that are passed down through families (inherited). These mutations cause several types of dyslipidemia. Secondary dyslipidemia may be caused by various risk factors that can lead to the disease, such as lifestyle choices and certain medical conditions. What increases the risk? You are more likely to develop this condition if you are an older man or if you are a woman who has gone through menopause. Other risk factors include: Having a family history of dyslipidemia. Taking certain medicines, including birth control pills, steroids, some diuretics, and beta-blockers. Eating a diet high in saturated fat. Smoking cigarettes or excessive alcohol intake. Having certain medical conditions such as diabetes,  polycystic ovary syndrome  (PCOS), kidney disease, liver disease, or hypothyroidism. Not exercising regularly. Being overweight or obese with too much belly fat. What are the signs or symptoms? In most cases, dyslipidemia does not usually cause any symptoms. In severe cases, very high lipid levels can cause: Fatty bumps under the skin (xanthomas). A white or gray ring around the black center (pupil) of the eye. Very high triglyceride levels can cause inflammation of the pancreas (pancreatitis). How is this diagnosed? Your health care provider may diagnose dyslipidemia based on a routine blood test (fasting blood test). Because most people do not have symptoms of the condition, this blood testing (lipid profile) is done on adults age 43 and older and is repeated every 4-6 years. This test checks: Total cholesterol. This measures the total amount of cholesterol in your blood, including LDL cholesterol, HDL cholesterol, and triglycerides. A healthy number is below 200 mg/dL (4.82 mmol/L). LDL cholesterol. The target number for LDL cholesterol is different for each person, depending on individual risk factors. A healthy number is usually below 100 mg/dL (7.40 mmol/L). Ask your health care provider what your LDL cholesterol should be. HDL cholesterol. An HDL level of 60 mg/dL (8.44 mmol/L) or higher is best because it helps to protect against heart disease. A number below 40 mg/dL (8.96 mmol/L) for men or below 50 mg/dL (8.70 mmol/L) for women increases the risk for heart disease. Triglycerides. A healthy triglyceride number is below 150 mg/dL (8.30 mmol/L). If your lipid profile is abnormal, your health care provider may do other blood tests. How is this treated? Treatment depends on the type of dyslipidemia that you have and your other risk factors for heart disease and stroke. Your health care provider will have a target range for your lipid levels based on this information. Treatment for dyslipidemia starts with lifestyle  changes, such as diet and exercise. Your health care provider may recommend that you: Get regular exercise. Make changes to your diet. Quit smoking if you smoke. Limit your alcohol intake. If diet changes and exercise do not help you reach your goals, your health care provider may also prescribe medicine to lower lipids. The most commonly prescribed type of medicine lowers your LDL cholesterol (statin drug). If you have a high triglyceride level, your provider may prescribe another type of drug (fibrate) or an omega-3 fish oil supplement, or both. Follow these instructions at home: Eating and drinking  Follow instructions from your health care provider or dietitian about eating or drinking restrictions. Eat a healthy diet as told by your health care provider. This can help you reach and maintain a healthy weight, lower your LDL cholesterol, and raise your HDL cholesterol. This may include: Limiting your calories, if you are overweight. Eating more fruits, vegetables, whole grains, fish, and lean meats. Limiting saturated fat, trans fat, and cholesterol. Do not drink alcohol if: Your health care provider tells you not to drink. You are pregnant, may be pregnant, or are planning to become pregnant. If you drink alcohol: Limit how much you have to: 0-1 drink a day for women. 0-2 drinks a day for men. Know how much alcohol is in your drink. In the [place] one drink equals one 12 oz bottle of beer (355 mL), one 5 oz glass of wine (148 mL), or one 1 oz glass of hard liquor (44 mL). Activity Get regular exercise. Start an exercise and strength training program as told by your health care provider. Ask your health care provider what activities  are safe for you. Your health care provider may recommend: 30 minutes of aerobic activity 4-6 days a week. Brisk walking is an example of aerobic activity. Strength training 2 days a week. General instructions Do not use any products that contain nicotine or  tobacco. These products include cigarettes, chewing tobacco, and vaping devices, such as e-cigarettes. If you need help quitting, ask your health care provider. Take over-the-counter and prescription medicines only as told by your health care provider. This includes supplements. Keep all follow-up visits. This is important. Contact a health care provider if: You are having trouble sticking to your exercise or diet plan. You are struggling to quit smoking or to control your use of alcohol. Summary Dyslipidemia often involves a high level of cholesterol or triglycerides, which are types of lipids. Treatment depends on the type of dyslipidemia that you have and your other risk factors for heart disease and stroke. Treatment for dyslipidemia starts with lifestyle changes, such as diet and exercise. Your health care provider may prescribe medicine to lower lipids. This information is not intended to replace advice given to you by your health care provider. Make sure you discuss any questions you have with your health care provider. Document Revised: 04/26/2022 Document Reviewed: 11/27/2020 Elsevier Patient Education  2025 [employer]. Hypertension, Adult High blood pressure (hypertension) is when the force of blood pumping through the arteries is too strong. The arteries are the blood vessels that carry blood from the heart throughout the body. Hypertension forces the heart to work harder to pump blood and may cause arteries to become narrow or stiff. Untreated or uncontrolled hypertension can lead to a heart attack, heart failure, a stroke, kidney disease, and other problems. A blood pressure reading consists of a higher number over a lower number. Ideally, your blood pressure should be below 120/80. The first (top) number is called the systolic pressure. It is a measure of the pressure in your arteries as your heart beats. The second (bottom) number is called the diastolic pressure. It is a  measure of the pressure in your arteries as the heart relaxes. What are the causes? The exact cause of this condition is not known. There are some conditions that result in high blood pressure. What increases the risk? Certain factors may make you more likely to develop high blood pressure. Some of these risk factors are under your control, including: Smoking. Not getting enough exercise or physical activity. Being overweight. Having too much fat, sugar, calories, or salt (sodium) in your diet. Drinking too much alcohol. Other risk factors include: Having a personal history of heart disease, diabetes, high cholesterol, or kidney disease. Stress. Having a family history of high blood pressure and high cholesterol. Having obstructive sleep apnea. Age. The risk increases with age. What are the signs or symptoms? High blood pressure may not cause symptoms. Very high blood pressure (hypertensive crisis) may cause: Headache. Fast or irregular heartbeats (palpitations). Shortness of breath. Nosebleed. Nausea and vomiting. Vision changes. Severe chest pain, dizziness, and seizures. How is this diagnosed? This condition is diagnosed by measuring your blood pressure while you are seated, with your arm resting on a flat surface, your legs uncrossed, and your feet flat on the floor. The cuff of the blood pressure monitor will be placed directly against the skin of your upper arm at the level of your heart. Blood pressure should be measured at least twice using the same arm. Certain conditions can cause a difference in blood pressure between your right  and left arms. If you have a high blood pressure reading during one visit or you have normal blood pressure with other risk factors, you may be asked to: Return on a different day to have your blood pressure checked again. Monitor your blood pressure at home for 1 week or longer. If you are diagnosed with hypertension, you may have other blood or  imaging tests to help your health care provider understand your overall risk for other conditions. How is this treated? This condition is treated by making healthy lifestyle changes, such as eating healthy foods, exercising more, and reducing your alcohol intake. You may be referred for counseling on a healthy diet and physical activity. Your health care provider may prescribe medicine if lifestyle changes are not enough to get your blood pressure under control and if: Your systolic blood pressure is above 130. Your diastolic blood pressure is above 80. Your personal target blood pressure may vary depending on your medical conditions, your age, and other factors. Follow these instructions at home: Eating and drinking  Eat a diet that is high in fiber and potassium, and low in sodium, added sugar, and fat. An example of this eating plan is called the DASH diet. DASH stands for Dietary Approaches to Stop Hypertension. To eat this way: Eat plenty of fresh fruits and vegetables. Try to fill one half of your plate at each meal with fruits and vegetables. Eat whole grains, such as whole-wheat pasta, brown rice, or whole-grain bread. Fill about one fourth of your plate with whole grains. Eat or drink low-fat dairy products, such as skim milk or low-fat yogurt. Avoid fatty cuts of meat, processed or cured meats, and poultry with skin. Fill about one fourth of your plate with lean proteins, such as fish, chicken without skin, beans, eggs, or tofu. Avoid pre-made and processed foods. These tend to be higher in sodium, added sugar, and fat. Reduce your daily sodium intake. Many people with hypertension should eat less than 1,500 mg of sodium a day. Do not drink alcohol if: Your health care provider tells you not to drink. You are pregnant, may be pregnant, or are planning to become pregnant. If you drink alcohol: Limit how much you have to: 0-1 drink a day for women. 0-2 drinks a day for men. Know how  much alcohol is in your drink. In the [place] one drink equals one 12 oz bottle of beer (355 mL), one 5 oz glass of wine (148 mL), or one 1 oz glass of hard liquor (44 mL). Lifestyle  Work with your health care provider to maintain a healthy body weight or to lose weight. Ask what an ideal weight is for you. Get at least 30 minutes of exercise that causes your heart to beat faster (aerobic exercise) most days of the week. Activities may include walking, swimming, or biking. Include exercise to strengthen your muscles (resistance exercise), such as Pilates or lifting weights, as part of your weekly exercise routine. Try to do these types of exercises for 30 minutes at least 3 days a week. Do not use any products that contain nicotine or tobacco. These products include cigarettes, chewing tobacco, and vaping devices, such as e-cigarettes. If you need help quitting, ask your health care provider. Monitor your blood pressure at home as told by your health care provider. Keep all follow-up visits. This is important. Medicines Take over-the-counter and prescription medicines only as told by your health care provider. Follow directions carefully. Blood pressure medicines must be  taken as prescribed. Do not skip doses of blood pressure medicine. Doing this puts you at risk for problems and can make the medicine less effective. Ask your health care provider about side effects or reactions to medicines that you should watch for. Contact a health care provider if you: Think you are having a reaction to a medicine you are taking. Have headaches that keep coming back (recurring). Feel dizzy. Have swelling in your ankles. Have trouble with your vision. Get help right away if you: Develop a severe headache or confusion. Have unusual weakness or numbness. Feel faint. Have severe pain in your chest or abdomen. Vomit repeatedly. Have trouble breathing. These symptoms may be an emergency. Get help right away.  Call 911. Do not wait to see if the symptoms will go away. Do not drive yourself to the hospital. Summary Hypertension is when the force of blood pumping through your arteries is too strong. If this condition is not controlled, it may put you at risk for serious complications. Your personal target blood pressure may vary depending on your medical conditions, your age, and other factors. For most people, a normal blood pressure is less than 120/80. Hypertension is treated with lifestyle changes, medicines, or a combination of both. Lifestyle changes include losing weight, eating a healthy, low-sodium diet, exercising more, and limiting alcohol. This information is not intended to replace advice given to you by your health care provider. Make sure you discuss any questions you have with your health care provider. Document Revised: 07/31/2021 Document Reviewed: 07/31/2021 Elsevier Patient Education  2024 [employer] Inc.  The above assessment and management plan was discussed with the patient. The patient verbalized understanding of and has agreed to the management plan. Patient is aware to call the clinic if they develop any new symptoms or if symptoms persist or worsen. Patient is aware when to return to the clinic for a follow-up visit. Patient educated on when it is appropriate to go to the emergency department.   Beyla Loney [place] Thompson, DNP [facility] [address] [place], [place] [address] [phone]

## 2024-08-16 ENCOUNTER — Encounter: Payer: Self-pay | Admitting: Gastroenterology

## 2024-08-16 ENCOUNTER — Ambulatory Visit: Admitting: Gastroenterology

## 2024-08-16 ENCOUNTER — Telehealth: Payer: Self-pay | Admitting: *Deleted

## 2024-08-16 VITALS — BP 123/87 | HR 111 | Temp 98.2°F | Ht 72.0 in | Wt 281.6 lb

## 2024-08-16 DIAGNOSIS — K7581 Nonalcoholic steatohepatitis (NASH): Secondary | ICD-10-CM

## 2024-08-16 DIAGNOSIS — K7460 Unspecified cirrhosis of liver: Secondary | ICD-10-CM | POA: Insufficient documentation

## 2024-08-16 NOTE — Telephone Encounter (Signed)
 LMOVM to call back to schedule EGD +/-variceal banding, ASA 3, RM 3, hold farxiga  3 days prior, no metformin  night prior or day of procedure

## 2024-08-16 NOTE — Patient Instructions (Addendum)
 Continue to follow with [facility] as scheduled.  Follow up with your PCP regarding your medications that you do not have refills on. It appears you should have refill for Vit D, losartan , pravastatin, metformin .  Upper endoscopy in the near future.   You should complete colonoscopy at age 43.

## 2024-08-16 NOTE — Progress Notes (Signed)
 GI Office Note    Referring Provider: Stephane Quest, CRNP Primary Care Physician:  Deitra Morton Sebastian Nena, NP  Primary Gastroenterologist:  Chief Complaint   Chief Complaint  Patient presents with   New Patient (Initial Visit)    Pt referred for other cirrhosis     History of Present Illness   Jack Porter is a 43 y.o. male presenting today at the request of Stephane Quest, CRNP for EGD for esophageal variceal screening.   Patient seen by Stephane Quest, CRNP with [facility] several months ago for elevated liver enzymes and fatty liver disease.  Intermittent elevation in liver chemistries since at least 2019 with mixed hepatic/cholestatic pattern.  Right upper quadrant ultrasound March 2024 with hepatic steatosis.  He has borderline thrombocytopenia.  Other past medical history significant for hypertension, hyperlipidemia, type 2 diabetes, history of significant obesity with maximum weight of 360 pounds.  Patient with remote history of heavy alcohol use.  Abdominal ultrasound May 21, 2024 showed nodular contour to the liver and mildly enlarged spleen.  FibroScan June 01, 2024 with CAP of 333, liver stiffness 28.7 consistent with F4 hepatic fibrosis likely to have clinically significant portal hypertension, S3 hepatic steatosis.  Patient referred to us  for screening upper endoscopy, given liver stiffness greater than 25K PA.   Discussed the use of AI scribe software for clinical note transcription with the patient, who gave verbal consent to proceed.  History of Present Illness   Jack Porter is a 43 year old male with liver disease who presents for an endoscopy to screen for esophageal varices.     He was recently diagnosed with cirrhosis. He has a significant history of alcohol use in his twenties and early thirties, consuming a fifth of alcohol every two to three days, but no heavy use in several years. Rare consumption of alcohol since 2020. Absolutely no etoh since his  liver disease diagnosis. PETH negative.   No current symptoms such as hematochezia, melena, abdominal pain, heartburn, dyspepsia, or dysphagia. His appetite is good, although he experiences flatulence, which he attributes to certain foods. His wife is concerned about malodorous flatulence at times, sulfur smell.He took GLP1 antagonist only two shots due to GI side effects.   He is currently only taking his metformin  for diabetes. States he had no refills when he went to pharmacy after recent PCP visit. Looking at records, all of his medications were refills except his farxiga  and that was because PCP did not think he needed refills. Patient to check with PCP and [facility] pharmacy.   Maximum weight 360 pounds.  02/2021, 296 pounds 07/2021, 317 pounds 09/2023, 287 pounds 08/2024, 281 pounds    Medications   Current Outpatient Medications  Medication Sig Dispense Refill   blood glucose meter kit and supplies KIT Dispense based on patient and insurance preference. Use up to four times daily as directed. (FOR ICD-9 250.00, 250.01). 1 each 0   metFORMIN  (GLUCOPHAGE ) 1000 MG tablet Take 1 tablet (1,000 mg total) by mouth 2 (two) times daily with a meal. 180 tablet 0   dapagliflozin  propanediol (FARXIGA ) 10 MG TABS tablet Take 1 tablet (10 mg total) by mouth daily before breakfast. (Patient not taking: Reported on 08/16/2024) 30 tablet 1   glucose blood (IGLUCOSE TEST STRIPS) test strip Use as instructed (Patient not taking: Reported on 08/16/2024) 100 each 12   losartan  (COZAAR ) 25 MG tablet Take 1 tablet (25 mg total) by mouth daily. (Patient not taking: Reported  on 08/16/2024) 90 tablet 0   pravastatin (PRAVACHOL) 10 MG tablet Take 1 tablet (10 mg total) by mouth daily. (Patient not taking: Reported on 08/16/2024) 90 tablet 0   Vitamin D , Ergocalciferol , (DRISDOL ) 1.25 MG (50000 UNIT) CAPS capsule Take 1 capsule (50,000 Units total) by mouth every 7 (seven) days. (Patient not taking: Reported on  08/16/2024) 12 capsule 0   No current facility-administered medications for this visit.    Allergies   Allergies as of 08/16/2024   (No Known Allergies)    Past Medical History   Past Medical History:  Diagnosis Date   Cirrhosis (HCC)    Dyslipidemia    GERD (gastroesophageal reflux disease)    H/O hiatal hernia    Type 2 diabetes mellitus (HCC)     Past Surgical History   Past Surgical History:  Procedure Laterality Date   CHOLECYSTECTOMY N/A 01/21/2014   Procedure: LAPAROSCOPIC CHOLECYSTECTOMY;  Surgeon: Camellia CHRISTELLA Blush, MD;  Location: [facility];  Service: General;  Laterality: N/A;    Past Family History   Family History  Problem Relation Age of Onset   Diabetes Mother    Heart disease Sister    Diabetes Sister    Liver disease Neg Hx     Past Social History   Social History   Socioeconomic History   Marital status: Married    Spouse name: Not on file   Number of children: Not on file   Years of education: Not on file   Highest education level: 12th grade  Occupational History   Not on file  Tobacco Use   Smoking status: Former    Current packs/day: 0.25    Average packs/day: 0.3 packs/day for 3.0 years (0.8 ttl pk-yrs)    Types: Cigarettes   Smokeless tobacco: Never  Vaping Use   Vaping status: Never Used  Substance and Sexual Activity   Alcohol use: No    Comment: rarely   Drug use: No   Sexual activity: Yes  Other Topics Concern   Not on file  Social History Narrative   Not on file   Social Drivers of Health   Financial Resource Strain: Low Risk  (09/22/2023)   Overall Financial Resource Strain (CARDIA)    Difficulty of Paying Living Expenses: Not very hard  Food Insecurity: Low Risk  (05/19/2024)   Received from [facility]   Hunger Vital Sign    Within the past 12 months, you worried that your food would run out before you got money to buy more: Never true    Within the past 12 months, the food you bought just didn't last and you  didn't have money to get more. : Never true  Transportation Needs: No Transportation Needs (05/19/2024)   Received from [facility]    In the past 12 months, has lack of reliable transportation kept you from medical appointments, meetings, work or from getting things needed for daily living? : No  Physical Activity: Insufficiently Active (09/22/2023)   Exercise Vital Sign    Days of Exercise per Week: 3 days    Minutes of Exercise per Session: 20 min  Stress: No Stress Concern Present (09/22/2023)   [employer] of Occupational Health - Occupational Stress Questionnaire    Feeling of Stress : Not at all  Social Connections: Moderately Integrated (09/22/2023)   Social Connection and Isolation Panel    Frequency of Communication with Friends and Family: Once a week    Frequency of Social Gatherings  with Friends and Family: Three times a week    Attends Religious Services: 1 to 4 times per year    Active Member of Clubs or Organizations: No    Attends Banker Meetings: Not on file    Marital Status: Married  Catering Manager Violence: Not At Risk (01/07/2024)   Humiliation, Afraid, Rape, and Kick questionnaire    Fear of Current or Ex-Partner: No    Emotionally Abused: No    Physically Abused: No    Sexually Abused: No    Review of Systems   General: Negative for anorexia, weight loss, fever, chills, fatigue, weakness. Eyes: Negative for vision changes.  ENT: Negative for hoarseness, difficulty swallowing, nasal congestion. CV: Negative for chest pain, angina, palpitations, dyspnea on exertion, peripheral edema.  Respiratory: Negative for dyspnea at rest, dyspnea on exertion, cough, sputum, wheezing.  GI: See history of present illness. GU:  Negative for dysuria, hematuria, urinary incontinence, urinary frequency, nocturnal urination.  MS: Negative for joint pain, low back pain.  Derm: Negative for rash or itching.  Neuro: Negative for weakness,  abnormal sensation, seizure, frequent headaches, memory loss,  confusion.  Psych: Negative for anxiety, depression, suicidal ideation, hallucinations.  Endo: Negative for unusual weight change.  Heme: Negative for bruising or bleeding. Allergy: Negative for rash or hives.  Physical Exam   BP 123/87   Pulse (!) 111   Temp 98.2 F (36.8 C)   Ht 6' (1.829 m)   Wt 281 lb 9.6 oz (127.7 kg)   BMI 38.19 kg/m    General: Well-nourished, well-developed in no acute distress.  Head: Normocephalic, atraumatic.   Eyes: Conjunctiva pink, no icterus. Mouth: Oropharyngeal mucosa moist and pink  Neck: Supple without thyromegaly, masses, or lymphadenopathy.  Lungs: Clear to auscultation bilaterally.  Heart: Regular rate and rhythm, no murmurs rubs or gallops.  Abdomen: Bowel sounds are normal, nontender, nondistended, no hepatosplenomegaly or masses,  no abdominal bruits, no rebound or guarding.  Large umbilical hernia, easily reducible, nontender Rectal: not performed Extremities: No lower extremity edema. No clubbing or deformities.  Neuro: Alert and oriented x 4 , grossly normal neurologically.  Skin: Warm and dry, no rash or jaundice.   Psych: Alert and cooperative, normal mood and affect.  Labs   05/19/2024: Creatinine 0.78, total bilirubin 1.8, indirect bilirubin 1.35, alk phos 88, AST 26, ALT 24, white blood cell count 5000, hemoglobin 17, platelets 150, INR 06 April 2024: A1c 7.16 June 2023: A1c 10.4  Imaging Studies   No results found.  Assessment/Plan:    Cirrhosis of liver:  Concern for clinically significant portal hypertension and increased risk of esophageal varices, EGD requested by [facility], Dawn Drazek, CRNP -EGD with possible esophageal variceal banding. ASA 3. Room 3.  I have discussed the risks, alternatives, benefits with regards to but not limited to the risk of reaction to medication, bleeding, infection, perforation and the patient is agreeable to proceed.  Written consent to be obtained. -he will continue to follow with [facility] for his cirrhosis  -encouraged him to reach out to PCP for clarification on medications. It appears he should still be on all of medications listed and he was switched to pravastatin for cholesterol management but he has not started. He reports going to pharmacy and only metformin  being available for pick up.  -explained importance of management of his diabetes, dyslipidemia, HTN, and vitamin D  deficiency in controlling his liver disease as well.   Sulfur flatulence: -no concerns for stool  frequency or loose stool -intermittent malodorous gas, but denies sulfur burps -request he limit eggs, dairy,cauliflower, cabbage, broccoli, radishes, spinach, brussels sprouts, legumes to see if this helps -try probiotic -reach out if ongoing symptoms -add probiotic  Colon cancer screening: -recommend colonoscopy at age 40  Sonny RAMAN. Ezzard, MHS, PA-C [facility]

## 2024-08-17 ENCOUNTER — Other Ambulatory Visit: Payer: Self-pay | Admitting: Nurse Practitioner

## 2024-08-17 DIAGNOSIS — I10 Essential (primary) hypertension: Secondary | ICD-10-CM

## 2024-08-17 DIAGNOSIS — E1169 Type 2 diabetes mellitus with other specified complication: Secondary | ICD-10-CM

## 2024-08-17 DIAGNOSIS — E119 Type 2 diabetes mellitus without complications: Secondary | ICD-10-CM

## 2024-08-17 DIAGNOSIS — E559 Vitamin D deficiency, unspecified: Secondary | ICD-10-CM

## 2024-08-17 NOTE — Telephone Encounter (Signed)
 Copied from CRM [id number]. Topic: Clinical - Medication Refill >> Aug 17, 2024 10:10 AM Corin V wrote: Medication: dapagliflozin  propanediol (FARXIGA ) 10 MG TABS tablet losartan  (COZAAR ) 25 MG tablet pravastatin (PRAVACHOL) 10 MG tablet Vitamin D , Ergocalciferol , (DRISDOL ) 1.25 MG (50000 UNIT) CAPS capsule  Pharmacy stated metformin  was the only script received on 07/29/24  Has the patient contacted their pharmacy? Yes (Agent: If no, request that the patient contact the pharmacy for the refill. If patient does not wish to contact the pharmacy document the reason why and proceed with request.) (Agent: If yes, when and what did the pharmacy advise?)  This is the patient's preferred pharmacy:  [id number] - [place], [place] - [address] [place] [address] Phone: [phone] Fax: [phone]  Is this the correct pharmacy for this prescription? Yes If no, delete pharmacy and type the correct one.   Has the prescription been filled recently? Yes  Is the patient out of the medication? No  Has the patient been seen for an appointment in the last year OR does the patient have an upcoming appointment? Yes  Can we respond through MyChart? No  Agent: Please be advised that Rx refills may take up to 3 business days. We ask that you follow-up with your pharmacy.

## 2024-08-17 NOTE — Telephone Encounter (Addendum)
 Spoke with pt. He wanted a Friday for procedure. Scheduled with Dr. Cindie 12/19. Aware will mail instructions to him. Aware medications to be held.   PA approved for EGD via [employer] Order ID: [id number]       Authorized Approval Valid Through: 08/17/2024 - 10/15/2024

## 2024-08-20 DIAGNOSIS — H40013 Open angle with borderline findings, low risk, bilateral: Secondary | ICD-10-CM | POA: Diagnosis not present

## 2024-08-20 DIAGNOSIS — E119 Type 2 diabetes mellitus without complications: Secondary | ICD-10-CM | POA: Diagnosis not present

## 2024-09-16 ENCOUNTER — Telehealth: Payer: Self-pay

## 2024-09-16 NOTE — Telephone Encounter (Signed)
 Tried to reach out to patient but was unable to connect. LVM letting him know that his second pair of orthotics are in.

## 2024-09-20 ENCOUNTER — Encounter (HOSPITAL_COMMUNITY): Payer: Self-pay

## 2024-09-20 ENCOUNTER — Encounter (HOSPITAL_COMMUNITY)
Admission: RE | Admit: 2024-09-20 | Discharge: 2024-09-20 | Disposition: A | Source: Ambulatory Visit | Attending: Internal Medicine

## 2024-09-20 NOTE — Pre-Procedure Instructions (Signed)
 Attempted pre-op phone call. Left VM for him to call us back.

## 2024-09-23 ENCOUNTER — Telehealth: Payer: Self-pay | Admitting: *Deleted

## 2024-09-23 NOTE — Telephone Encounter (Signed)
 Patient called in. He stated he has a tooth infection and was prescribed abx to start taking. Message sent to Dr. Cindie.  Per Dr. Cindie should be fine as long as patient is up for it. He will need to have bite block in his mouth which may be uncomfortable.  Spoke with pt and he will leave procedure as is.

## 2024-09-23 NOTE — Telephone Encounter (Signed)
 Called pt, he is currently at the dentist and not sure yet what it is. He is going to call us  once he leaves.

## 2024-09-23 NOTE — Telephone Encounter (Signed)
 Pt is cancelling procedure for tomorrow due to an infected tooth. He says that he has pus coming from his tooth and would like to cancel at this time. He would like to reschedule in Feb. Advised pt will call once we get providers schedule for Feb.

## 2024-09-23 NOTE — Telephone Encounter (Signed)
-----   Message from Elveria GRADE sent at 09/23/2024  9:44 AM EST ----- This patient LM with questions about a toothache and meds.  His procedure is tomorrow.  Can you give him a call [phone].  Let me know if I need to do anything.  Thanks,

## 2024-09-24 ENCOUNTER — Ambulatory Visit (HOSPITAL_COMMUNITY): Admission: RE | Admit: 2024-09-24 | Admitting: Internal Medicine

## 2024-09-24 ENCOUNTER — Encounter (HOSPITAL_COMMUNITY): Admission: RE | Payer: Self-pay | Source: Home / Self Care

## 2024-09-24 SURGERY — EGD (ESOPHAGOGASTRODUODENOSCOPY)
Anesthesia: Choice

## 2024-10-19 NOTE — Telephone Encounter (Signed)
 LMOVM to return call  EGD+/-VARICEAL BANDING, asa 3 w/Dr.Carver

## 2024-10-21 NOTE — Telephone Encounter (Signed)
 LMOVM to return call.

## 2024-10-27 ENCOUNTER — Encounter: Payer: Self-pay | Admitting: *Deleted

## 2024-10-27 NOTE — Telephone Encounter (Signed)
 Letter mailed

## 2024-11-01 ENCOUNTER — Ambulatory Visit: Payer: Self-pay | Admitting: Nurse Practitioner

## 2024-11-03 ENCOUNTER — Ambulatory Visit: Admitting: Nurse Practitioner

## 2024-11-03 ENCOUNTER — Telehealth: Payer: Self-pay | Admitting: Nurse Practitioner

## 2024-11-03 NOTE — Telephone Encounter (Signed)
 Called and left message for patient to call back and reschedule appt for 11/03/24
